# Patient Record
Sex: Female | Born: 1995 | Hispanic: No | Marital: Single | Smoking: Never smoker
Health system: Southern US, Community
[De-identification: ages and names within clinical notes are randomized; demographics above are authoritative.]

## PROBLEM LIST (undated history)

## (undated) DIAGNOSIS — F32A Depression, unspecified: Secondary | ICD-10-CM

## (undated) DIAGNOSIS — F419 Anxiety disorder, unspecified: Secondary | ICD-10-CM

## (undated) DIAGNOSIS — F329 Major depressive disorder, single episode, unspecified: Secondary | ICD-10-CM

---

## 2017-07-31 ENCOUNTER — Encounter (HOSPITAL_COMMUNITY): Payer: Self-pay | Admitting: *Deleted

## 2017-07-31 ENCOUNTER — Encounter (HOSPITAL_COMMUNITY): Payer: Self-pay | Admitting: Emergency Medicine

## 2017-07-31 ENCOUNTER — Emergency Department (HOSPITAL_COMMUNITY)
Admission: EM | Admit: 2017-07-31 | Discharge: 2017-07-31 | Disposition: A | Discharge status: Other Acute Inpatient Hospital | Payer: BLUE CROSS/BLUE SHIELD | Attending: Emergency Medicine | Admitting: Emergency Medicine

## 2017-07-31 ENCOUNTER — Inpatient Hospital Stay (HOSPITAL_COMMUNITY)
Admission: AD | Admit: 2017-07-31 | Discharge: 2017-08-02 | DRG: 885 | Disposition: A | Discharge status: Home / Self Care | Payer: BLUE CROSS/BLUE SHIELD | Source: Intra-hospital | Attending: Psychiatry | Admitting: Psychiatry

## 2017-07-31 ENCOUNTER — Other Ambulatory Visit: Payer: Self-pay

## 2017-07-31 DIAGNOSIS — R45851 Suicidal ideations: Secondary | ICD-10-CM | POA: Diagnosis present

## 2017-07-31 DIAGNOSIS — N760 Acute vaginitis: Secondary | ICD-10-CM | POA: Diagnosis present

## 2017-07-31 DIAGNOSIS — F341 Dysthymic disorder: Secondary | ICD-10-CM

## 2017-07-31 DIAGNOSIS — G4700 Insomnia, unspecified: Secondary | ICD-10-CM | POA: Diagnosis present

## 2017-07-31 DIAGNOSIS — Z818 Family history of other mental and behavioral disorders: Secondary | ICD-10-CM | POA: Diagnosis not present

## 2017-07-31 DIAGNOSIS — F332 Major depressive disorder, recurrent severe without psychotic features: Secondary | ICD-10-CM | POA: Diagnosis present

## 2017-07-31 DIAGNOSIS — F1290 Cannabis use, unspecified, uncomplicated: Secondary | ICD-10-CM

## 2017-07-31 DIAGNOSIS — F410 Panic disorder [episodic paroxysmal anxiety] without agoraphobia: Secondary | ICD-10-CM | POA: Diagnosis present

## 2017-07-31 DIAGNOSIS — Z79899 Other long term (current) drug therapy: Secondary | ICD-10-CM

## 2017-07-31 DIAGNOSIS — F4010 Social phobia, unspecified: Secondary | ICD-10-CM | POA: Diagnosis present

## 2017-07-31 HISTORY — DX: Depression, unspecified: F32.A

## 2017-07-31 HISTORY — DX: Anxiety disorder, unspecified: F41.9

## 2017-07-31 HISTORY — DX: Major depressive disorder, single episode, unspecified: F32.9

## 2017-07-31 LAB — ACETAMINOPHEN LEVEL: Acetaminophen (Tylenol), Serum: <10 ug/mL — ABNORMAL LOW (ref 10–30)

## 2017-07-31 LAB — URINALYSIS, ROUTINE W REFLEX MICROSCOPIC
Bilirubin Urine: NEGATIVE
Glucose, UA: NEGATIVE mg/dL
HGB URINE DIPSTICK: NEGATIVE
Ketones, ur: NEGATIVE mg/dL
NITRITE: NEGATIVE
PH: 5 (ref 5.0–8.0)
Protein, ur: NEGATIVE mg/dL
SPECIFIC GRAVITY, URINE: 1.031 — AB (ref 1.005–1.030)

## 2017-07-31 LAB — I-STAT CHEM 8, ED
BUN: 14 mg/dL (ref 6–20)
CHLORIDE: 103 mmol/L (ref 98–111)
Calcium, Ion: 1.16 mmol/L (ref 1.15–1.40)
Creatinine, Ser: 0.5 mg/dL (ref 0.44–1.00)
GLUCOSE: 107 mg/dL — AB (ref 70–99)
HCT: 41 % (ref 36.0–46.0)
HEMOGLOBIN: 13.9 g/dL (ref 12.0–15.0)
Potassium: 3.9 mmol/L (ref 3.5–5.1)
SODIUM: 137 mmol/L (ref 135–145)
TCO2: 21 mmol/L — AB (ref 22–32)

## 2017-07-31 LAB — CBC WITH DIFFERENTIAL/PLATELET
BASOS PCT: 0 %
Basophils Absolute: 0 10*3/uL (ref 0.0–0.1)
Eosinophils Absolute: 0.1 10*3/uL (ref 0.0–0.7)
Eosinophils Relative: 2 %
HEMATOCRIT: 39.3 % (ref 36.0–46.0)
HEMOGLOBIN: 13.8 g/dL (ref 12.0–15.0)
LYMPHS ABS: 2.4 10*3/uL (ref 0.7–4.0)
LYMPHS PCT: 51 %
MCH: 32.2 pg (ref 26.0–34.0)
MCHC: 35.1 g/dL (ref 30.0–36.0)
MCV: 91.6 fL (ref 78.0–100.0)
MONO ABS: 0.4 10*3/uL (ref 0.1–1.0)
Monocytes Relative: 9 %
NEUTROS ABS: 1.7 10*3/uL (ref 1.7–7.7)
NEUTROS PCT: 38 %
Platelets: 339 10*3/uL (ref 150–400)
RBC: 4.29 MIL/uL (ref 3.87–5.11)
RDW: 12.8 % (ref 11.5–15.5)
WBC: 4.6 10*3/uL (ref 4.0–10.5)

## 2017-07-31 LAB — ETHANOL: Alcohol, Ethyl (B): <10 mg/dL (ref <10)

## 2017-07-31 LAB — HEPATIC FUNCTION PANEL
ALT: 37 U/L (ref 0–44)
AST: 28 U/L (ref 15–41)
Albumin: 4.1 g/dL (ref 3.5–5.0)
Alkaline Phosphatase: 52 U/L (ref 38–126)
BILIRUBIN DIRECT: 0.1 mg/dL (ref 0.0–0.2)
BILIRUBIN INDIRECT: 1.1 mg/dL — AB (ref 0.3–0.9)
BILIRUBIN TOTAL: 1.2 mg/dL (ref 0.3–1.2)
Total Protein: 7.7 g/dL (ref 6.5–8.1)

## 2017-07-31 LAB — RAPID URINE DRUG SCREEN, HOSP PERFORMED
AMPHETAMINES: NOT DETECTED
Benzodiazepines: NOT DETECTED
COCAINE: NOT DETECTED
Opiates: NOT DETECTED
TETRAHYDROCANNABINOL: NOT DETECTED

## 2017-07-31 LAB — I-STAT BETA HCG BLOOD, ED (MC, WL, AP ONLY)

## 2017-07-31 LAB — SALICYLATE LEVEL

## 2017-07-31 MED ORDER — FLUOXETINE HCL 20 MG PO CAPS
20.0000 mg | ORAL_CAPSULE | Freq: Every day | ORAL | Status: DC
  Administered 2017-07-31 – 2017-08-02 (×3): 20 mg via ORAL
  Filled 2017-07-31 (×6): qty 1

## 2017-07-31 MED ORDER — PROPRANOLOL HCL 20 MG PO TABS
20.0000 mg | ORAL_TABLET | Freq: Every day | ORAL | Status: DC
  Administered 2017-07-31: 20 mg via ORAL
  Filled 2017-07-31 (×3): qty 1
  Filled 2017-07-31: qty 2

## 2017-07-31 MED ORDER — HYDROXYZINE HCL 25 MG PO TABS
25.0000 mg | ORAL_TABLET | Freq: Four times a day (QID) | ORAL | Status: DC | PRN
  Administered 2017-07-31 – 2017-08-01 (×2): 25 mg via ORAL
  Filled 2017-07-31 (×2): qty 1

## 2017-07-31 MED ORDER — ACETAMINOPHEN 325 MG PO TABS: 650.0000 mg | ORAL_TABLET | ORAL | Status: DC | PRN

## 2017-07-31 MED ORDER — MAGNESIUM HYDROXIDE 400 MG/5ML PO SUSP: 30.0000 mL | Freq: Every day | ORAL | Status: DC | PRN

## 2017-07-31 MED ORDER — PROPRANOLOL HCL 10 MG PO TABS
10.0000 mg | ORAL_TABLET | Freq: Two times a day (BID) | ORAL | Status: DC
  Administered 2017-08-01 – 2017-08-02 (×3): 10 mg via ORAL
  Filled 2017-07-31 (×7): qty 1

## 2017-07-31 MED ORDER — TRAZODONE HCL 50 MG PO TABS
50.0000 mg | ORAL_TABLET | Freq: Every evening | ORAL | Status: DC | PRN
  Administered 2017-07-31 – 2017-08-01 (×2): 50 mg via ORAL
  Filled 2017-07-31 (×2): qty 1

## 2017-07-31 MED ORDER — ONDANSETRON HCL 4 MG PO TABS: 4.0000 mg | ORAL_TABLET | Freq: Three times a day (TID) | ORAL | Status: DC | PRN

## 2017-07-31 MED ORDER — ALUM & MAG HYDROXIDE-SIMETH 200-200-20 MG/5ML PO SUSP: 30.0000 mL | ORAL | Status: DC | PRN

## 2017-07-31 MED ORDER — ALUM & MAG HYDROXIDE-SIMETH 200-200-20 MG/5ML PO SUSP: 30.0000 mL | Freq: Four times a day (QID) | ORAL | Status: DC | PRN

## 2017-07-31 MED ORDER — ACETAMINOPHEN 325 MG PO TABS: 650.0000 mg | ORAL_TABLET | Freq: Four times a day (QID) | ORAL | Status: DC | PRN
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
## 2017-07-31 NOTE — BH Assessment (Signed)
[facility]  Per Juanetta BeetsJacqueline Norman, DO, this pt requires psychiatric hospitalization at this time.  Malva LimesLinsey Strader, RN, [place] [facility]; [facility] will be ready to receive pt at 11:30.  Pt has signed Voluntary Admission and Consent for Treatment, as well as Consent to Release Information to the [facility] [employer] [facility], and a notification call has been placed.  Signed [facility].  Pt's nurse, Kendal Hymendie, has been notified, and agrees to send original paperwork along with pt via [facility], and to call report to [phone].  Doylene Canninghomas Jahaan Vanwagner, [place] Behavioral Health Coordinator [phone]

## 2017-07-31 NOTE — Progress Notes (Signed)
The following items were placed in the patient's locker: toothbrush, [employer] gift card, [employer] gift card, and an insurance card (V.A.).

## 2017-07-31 NOTE — ED Notes (Signed)
Pt discharged safely with [place] driver.  Pt was in no distress.  All belongings sent with patient.
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
## 2017-07-31 NOTE — Therapy (Signed)
Occupational Therapy Group Treatment Note  Date:  07/31/2017 Time:  3:07 PM  Group Topic/Focus:  Stress Management  Participation Level:  Active  Participation Quality:  Appropriate  Affect:  Flat  Cognitive:  Appropriate  Insight: Improving  Engagement in Group:  Engaged  Modes of Intervention:  Activity, Discussion, Education and Socialization  Additional Comments:    S: "I just cannot control my feelings"  O: Stress management group completed to use as productive coping strategy, to help mitigate maladaptive coping to integrate in functional BADL/IADL. Education given on the definition of stress and its cognitive, behavioral, emotional, and physical effects on the body. Stress symptom checklist completed. Stress management tool worksheet discussed to educate on unhealthy vs healthy coping skills to manage stress to improve community integration. Self control circle activity completed to identify areas of control and areas not within personal control. Education given on use of progressive muscle relaxation. PMR script delivered with relaxing music to facilitate relaxation response to help increase ability to engage in BADL. Coloring and relaxation guide handouts given at the end of the session.   A: Pt presents as good group member, flat affect initially, but improving with smiles and laughter and jokes by the end of the group. Pt initiating conversation, offering examples, and relating to other group members. Pt completed stress symptom checklist, showing very high signs of stress. Pt engaged in stress management tools worksheet, offering examples and how to improve current practices. Self control circle activity completed, pt recognizing she cannot control how others speak to her- but she can control her reactions. She applied this to many different relationships. Pt engaged in PMR script, with much enjoyment and significant relaxation response. Pt asked for tips on emotional  regulation, mentioned pt try journaling and mood tracking.  P: Pt provided with education on stress management activities to implement into daily routine. Handouts given to facilitate carryover when reintegrating into community    [facility] Yonas Pugh, [place]OT, OTR/L  AvnetKaylee Damarien Pugh 07/31/2017, 3:07 PM

## 2017-07-31 NOTE — ED Notes (Signed)
Pt dressed out in scrubs, put purse with phone in belongings bag in cabinet.

## 2017-07-31 NOTE — ED Triage Notes (Signed)
Pt arriving voluntarily with [employer] for suicidal ideation. Pt reports having multiple mental breakdowns recently. When asked what triggers these thoughts pt replies "everything". Pt reports having no support system at home. Pt states her plan for suicide would be to take sleeping pills. Pt reports she is on medication for anxiety/depression but has not been taking them. Pt states " I feel like my emotions are just out of control."

## 2017-07-31 NOTE — Progress Notes (Signed)
Pt observed in the dayroom, attending wrap-up group. Pt appears anxious/depressed in affect and mood. Pt denies SI/HI/AVH/Pain at this time. Pt states this is her first in-patient admit. Pt was guarded and minimal with interaction; superficial in content. PRN vistaril and trazodone requested and given. Will continue with POC.

## 2017-07-31 NOTE — ED Provider Notes (Signed)
[facility]   CSN: [id number] Arrival date & time: 07/31/17  0505     History   Chief Complaint Chief Complaint  Patient presents with  . Suicidal    HPI Andrea Pugh is a 22 y.o. female.  The history is provided by the patient.  Depression  This is a chronic problem. The current episode started more than 1 week ago. The problem occurs constantly. The problem has been gradually worsening (it waxes and wanes whenever she sees happy people etc.  ). Pertinent negatives include no chest pain, no abdominal pain, no headaches and no shortness of breath. Nothing aggravates the symptoms. Nothing relieves the symptoms. She has tried nothing for the symptoms. The treatment provided no relief.    Past Medical History:  Diagnosis Date  . Anxiety   . Depression     There are no active problems to display for this patient.   History reviewed. No pertinent surgical history.   OB History   None      Home Medications    Prior to Admission medications   Medication Sig Start Date End Date Taking? Authorizing Provider  FLUoxetine (PROZAC) 20 MG capsule Take 20 mg by mouth daily. 06/05/17  Yes [provider]  propranolol (INDERAL) 20 MG tablet Take 20 mg by mouth daily.  06/05/17  Yes [provider]    Family History No family history on file.  Social History Social History   Tobacco Use  . Smoking status: Never Smoker  . Smokeless tobacco: Never Used  Substance Use Topics  . Alcohol use: Yes    Comment: occasional  . Drug use: Yes    Types: Marijuana     Allergies   Patient has no known allergies.   Review of Systems Review of Systems  Respiratory: Negative for shortness of breath.   Cardiovascular: Negative for chest pain.  Gastrointestinal: Negative for abdominal pain and vomiting.  Genitourinary: Negative for flank pain and vaginal discharge.  Neurological: Negative for headaches.    Psychiatric/Behavioral: Positive for depression, dysphoric mood and suicidal ideas. Negative for hallucinations and self-injury.  All other systems reviewed and are negative.    Physical Exam Updated Vital Signs BP 121/71 (BP Location: Left Arm)   Pulse 83   Temp 98.9 F (37.2 C) (Oral)   Resp 14   Ht 5\' 5"  (1.651 m)   Wt 81.6 kg (180 lb)   LMP 06/19/2017 (Approximate)   SpO2 97%   BMI 29.95 kg/m   Physical Exam  Constitutional: She is oriented to person, place, and time. She appears well-developed and well-nourished. No distress.  HENT:  Head: Normocephalic and atraumatic.  Mouth/Throat: No oropharyngeal exudate.  Eyes: Pupils are equal, round, and reactive to light. Conjunctivae are normal.  Neck: Normal range of motion. Neck supple.  Cardiovascular: Normal rate, regular rhythm, normal heart sounds and intact distal pulses.  Pulmonary/Chest: Effort normal and breath sounds normal. No stridor. She has no wheezes. She has no rales.  Abdominal: Soft. Bowel sounds are normal. She exhibits no mass. There is no tenderness. There is no rebound and no guarding.  Musculoskeletal: Normal range of motion.  Neurological: She is alert and oriented to person, place, and time.  Skin: Skin is warm and dry. Capillary refill takes less than 2 seconds.  Psychiatric: Her speech is not rapid and/or pressured and not delayed. She is not agitated. She exhibits a depressed mood.     ED Treatments / Results  Labs (all labs ordered are listed, but only abnormal results are displayed) Results for orders placed or performed during the hospital encounter of 07/31/17  CBC with Differential  Result Value Ref Range   WBC 4.6 4.0 - 10.5 K/uL   RBC 4.29 3.87 - 5.11 MIL/uL   Hemoglobin 13.8 12.0 - 15.0 g/dL   HCT 16.1 09.6 - 04.5 %   MCV 91.6 78.0 - 100.0 fL   MCH 32.2 26.0 - 34.0 pg   MCHC 35.1 30.0 - 36.0 g/dL   RDW 40.9 81.1 - 91.4 %   Platelets 339 150 - 400 K/uL   Neutrophils Relative % 38 %    Neutro Abs 1.7 1.7 - 7.7 K/uL   Lymphocytes Relative 51 %   Lymphs Abs 2.4 0.7 - 4.0 K/uL   Monocytes Relative 9 %   Monocytes Absolute 0.4 0.1 - 1.0 K/uL   Eosinophils Relative 2 %   Eosinophils Absolute 0.1 0.0 - 0.7 K/uL   Basophils Relative 0 %   Basophils Absolute 0.0 0.0 - 0.1 K/uL  Hepatic function panel  Result Value Ref Range   Total Protein 7.7 6.5 - 8.1 g/dL   Albumin 4.1 3.5 - 5.0 g/dL   AST 28 15 - 41 U/L   ALT 37 0 - 44 U/L   Alkaline Phosphatase 52 38 - 126 U/L   Total Bilirubin 1.2 0.3 - 1.2 mg/dL   Bilirubin, Direct 0.1 0.0 - 0.2 mg/dL   Indirect Bilirubin 1.1 (H) 0.3 - 0.9 mg/dL  Urinalysis, Routine w reflex microscopic  Result Value Ref Range   Color, Urine YELLOW YELLOW   APPearance CLOUDY (A) CLEAR   Specific Gravity, Urine 1.031 (H) 1.005 - 1.030   pH 5.0 5.0 - 8.0   Glucose, UA NEGATIVE NEGATIVE mg/dL   Hgb urine dipstick NEGATIVE NEGATIVE   Bilirubin Urine NEGATIVE NEGATIVE   Ketones, ur NEGATIVE NEGATIVE mg/dL   Protein, ur NEGATIVE NEGATIVE mg/dL   Nitrite NEGATIVE NEGATIVE   Leukocytes, UA TRACE (A) NEGATIVE   RBC / HPF 0-5 0 - 5 RBC/hpf   WBC, UA 0-5 0 - 5 WBC/hpf   Bacteria, UA RARE (A) NONE SEEN   Squamous Epithelial / LPF 21-50 0 - 5   Mucus PRESENT   I-Stat Chem 8, ED  Result Value Ref Range   Sodium 137 135 - 145 mmol/L   Potassium 3.9 3.5 - 5.1 mmol/L   Chloride 103 98 - 111 mmol/L   BUN 14 6 - 20 mg/dL   Creatinine, Ser 7.82 0.44 - 1.00 mg/dL   Glucose, Bld 956 (H) 70 - 99 mg/dL   Calcium, Ion 2.13 0.86 - 1.40 mmol/L   TCO2 21 (L) 22 - 32 mmol/L   Hemoglobin 13.9 12.0 - 15.0 g/dL   HCT 57.8 46.9 - 62.9 %  I-Stat Beta hCG blood, ED ([facility], [facility], [facility] only)  Result Value Ref Range   I-stat hCG, quantitative <5.0 <5 mIU/mL   Comment 3           No results found.   Procedures Procedures (including critical care time)    Final Clinical Impressions(s) / ED Diagnoses   Patient is here voluntarily and agrees for admission.   Medically cleared by me for psychiatry.  Disposition per that service.  Holding orders placed.     Raenell Mensing, MD 07/31/17 [phone]

## 2017-07-31 NOTE — Tx Team (Signed)
Initial Treatment Plan 07/31/2017 12:50 PM Andrea Pugh    PATIENT STRESSORS: Financial difficulties Medication change or noncompliance   PATIENT STRENGTHS: Average or above average intelligence Capable of independent living Physical Health Work skills   PATIENT IDENTIFIED PROBLEMS: "I need to start back on my medication so I don't have these suicidal thoughts."  "I feel like my emotions are out of control."  Suicidal Ideation  Depression  Anxiety             DISCHARGE CRITERIA:  Improved stabilization in mood, thinking, and/or behavior Motivation to continue treatment in a less acute level of care Need for constant or close observation no longer present  PRELIMINARY DISCHARGE PLAN: Outpatient therapy Return to previous living arrangement Return to previous work or school arrangements  PATIENT/FAMILY INVOLVEMENT: This treatment plan has been presented to and reviewed with the patient, Andrea Pugh.  The patient and family have been given the opportunity to ask questions and make suggestions.  Cranford MonBeaudry, Manuel Dall Evans, RN 07/31/2017, 12:50 PM

## 2017-07-31 NOTE — H&P (Addendum)
Psychiatric Admission Assessment Adult  Patient Identification: Andrea Pugh MRN:  [id number] Date of Evaluation:  07/31/2017 Chief Complaint: worsening depression and anxiety Principal Diagnosis:  MDD, no Psychotic Features  Diagnosis:   Patient Active Problem List   Diagnosis Date Noted  . Major depressive disorder, recurrent severe without psychotic features (HCC) [F33.2] 07/31/2017   History of Present Illness:  22 year old single female, college student, employed, presented to ED via [facility], due to worsening depression and anxiety. States she had texted a friend about how depressed she was,and that friend became concerned and called 911. States she has had several episodes of  panic attacks recently , and feels her depression has also been worsening . She endorses neuro-vegetative symptoms as below, and also describes passive (but not active) thoughts of death, dying . Endorses neuro-vegetative symptoms as below. Reports that there have been no clear triggers for her panic attacks, but that seeing people " enjoying themselves, laughing" makes her feel upset and anxious because " why can't I feel that way". In addition to depression describes significant anxiety and describes symptoms of social anxiety. Associated Signs/Symptoms: Depression Symptoms:  depressed mood, anhedonia, insomnia, suicidal thoughts without plan, loss of energy/fatigue, (Hypo) Manic Symptoms: denies  Anxiety Symptoms:  Reports long history of social anxiety , and feels very anxious when having to interact or speak in front of others. Also describes panic attacks which she feels have become more frequent over recent weeks to months. Psychotic Symptoms: denies  PTSD Symptoms: Does not endorse  Total Time spent with patient: 45 minutes  Past Psychiatric History: no prior psychiatric admissions, no history of suicide attempts, history of an isolated episode of self cutting about a year ago, denies history of  psychosis.  Is the patient at risk to self? Yes.    Has the patient been a risk to self in the past 6 months? Yes.    Has the patient been a risk to self within the distant past? Yes.    Is the patient a risk to others? No.  Has the patient been a risk to others in the past 6 months? No.  Has the patient been a risk to others within the distant past? No.   Prior Inpatient Therapy:  denies  Prior Outpatient Therapy:  medications are prescribed by Dr. Jannifer Franklin at [employer] [facility]   Alcohol Screening: 1. How often do you have a drink containing alcohol?: Monthly or less 2. How many drinks containing alcohol do you have on a typical day when you are drinking?: 1 or 2 3. How often do you have six or more drinks on one occasion?: Never AUDIT-C Score: 1 9. Have you or someone else been injured as a result of your drinking?: No 10. Has a relative or friend or a doctor or another health worker been concerned about your drinking or suggested you cut down?: No Alcohol Use Disorder Identification Test Final Score (AUDIT): 1 Intervention/Follow-up: AUDIT Score <7 follow-up not indicated Substance Abuse History in the last 12 months:  Denies alcohol or drug abuse  Consequences of Substance Abuse: Denies  Previous Psychotropic Medications:  Prozac / Propranolol, prescribed earlier this year. States she had not been taking regularly . States she had been on another antidepressant in the past but does not remember name. Psychological Evaluations:  No  Past Medical History: denies, NKDA Past Medical History:  Diagnosis Date  . Anxiety   . Depression    History reviewed. No pertinent surgical history. Family  History: father passed away 2011 from complications of DM, mother is alive, but is medically ill with Parkinson's and is in a NH. She has two sisters, two brothers. Family Psychiatric  History: reports younger sister has history of depression, no suicides in family, no history of substance abuse   Tobacco Screening: Does not smoke or use tobacco products  Social History: 22  Year old single female, no children, lives off campus with roommates, Consulting civil engineer at [employer]), employed . Social History   Substance and Sexual Activity  Alcohol Use Yes   Comment: occasional     Social History   Substance and Sexual Activity  Drug Use Yes  . Types: Marijuana    Additional Social History:  Allergies:  No Known Allergies Lab Results:  Results for orders placed or performed during the hospital encounter of 07/31/17 (from the past 48 hour(s))  Rapid urine drug screen (hospital performed)     Status: Abnormal   Collection Time: 07/31/17  5:53 AM  Result Value Ref Range   Opiates NONE DETECTED NONE DETECTED   Cocaine NONE DETECTED NONE DETECTED   Benzodiazepines NONE DETECTED NONE DETECTED   Amphetamines NONE DETECTED NONE DETECTED   Tetrahydrocannabinol NONE DETECTED NONE DETECTED   Barbiturates (A) NONE DETECTED    Result not available. Reagent lot number recalled by manufacturer.    Comment: Performed at [facility], [address], [place], [place] [address]  Urinalysis, Routine w reflex microscopic     Status: Abnormal   Collection Time: 07/31/17  5:53 AM  Result Value Ref Range   Color, Urine YELLOW YELLOW   APPearance CLOUDY (A) CLEAR   Specific Gravity, Urine 1.031 (H) 1.005 - 1.030   pH 5.0 5.0 - 8.0   Glucose, UA NEGATIVE NEGATIVE mg/dL   Hgb urine dipstick NEGATIVE NEGATIVE   Bilirubin Urine NEGATIVE NEGATIVE   Ketones, ur NEGATIVE NEGATIVE mg/dL   Protein, ur NEGATIVE NEGATIVE mg/dL   Nitrite NEGATIVE NEGATIVE   Leukocytes, UA TRACE (A) NEGATIVE   RBC / HPF 0-5 0 - 5 RBC/hpf   WBC, UA 0-5 0 - 5 WBC/hpf   Bacteria, UA RARE (A) NONE SEEN   Squamous Epithelial / LPF 21-50 0 - 5   Mucus PRESENT     Comment: Performed at [facility], [address], [place], [place] [address]  CBC with Differential     Status: None   Collection  Time: 07/31/17  5:54 AM  Result Value Ref Range   WBC 4.6 4.0 - 10.5 K/uL   RBC 4.29 3.87 - 5.11 MIL/uL   Hemoglobin 13.8 12.0 - 15.0 g/dL   HCT 09.8 11.9 - 14.7 %   MCV 91.6 78.0 - 100.0 fL   MCH 32.2 26.0 - 34.0 pg   MCHC 35.1 30.0 - 36.0 g/dL   RDW 82.9 56.2 - 13.0 %   Platelets 339 150 - 400 K/uL   Neutrophils Relative % 38 %   Neutro Abs 1.7 1.7 - 7.7 K/uL   Lymphocytes Relative 51 %   Lymphs Abs 2.4 0.7 - 4.0 K/uL   Monocytes Relative 9 %   Monocytes Absolute 0.4 0.1 - 1.0 K/uL   Eosinophils Relative 2 %   Eosinophils Absolute 0.1 0.0 - 0.7 K/uL   Basophils Relative 0 %   Basophils Absolute 0.0 0.0 - 0.1 K/uL    Comment: Performed at [facility], [address] [place], [place] [address]  Ethanol  Status: None   Collection Time: 07/31/17  5:54 AM  Result Value Ref Range   Alcohol, Ethyl (B) <10 <10 mg/dL    Comment: (NOTE) Lowest detectable limit for serum alcohol is 10 mg/dL. For medical purposes only. Performed at [facility], [address] [place], [place] [address]   Salicylate level     Status: None   Collection Time: 07/31/17  5:54 AM  Result Value Ref Range   Salicylate Lvl <7.0 2.8 - 30.0 mg/dL    Comment: Performed at [facility], [address] [place], [place] [address]  Acetaminophen level     Status: Abnormal   Collection Time: 07/31/17  5:54 AM  Result Value Ref Range   Acetaminophen (Tylenol), Serum <10 (L) 10 - 30 ug/mL    Comment: (NOTE) Therapeutic concentrations vary significantly. A range of 10-30 ug/mL  may be an effective concentration for many patients. However, some  are best treated at concentrations outside of this range. Acetaminophen concentrations >150 ug/mL at 4 hours after ingestion  and >50 ug/mL at 12 hours after ingestion are often associated with  toxic reactions. Performed at [facility], [address] [place], [place] [address]    Hepatic function panel     Status: Abnormal   Collection Time: 07/31/17  5:54 AM  Result Value Ref Range   Total Protein 7.7 6.5 - 8.1 g/dL   Albumin 4.1 3.5 - 5.0 g/dL   AST 28 15 - 41 U/L   ALT 37 0 - 44 U/L    Comment: Please note change in reference range.   Alkaline Phosphatase 52 38 - 126 U/L   Total Bilirubin 1.2 0.3 - 1.2 mg/dL   Bilirubin, Direct 0.1 0.0 - 0.2 mg/dL    Comment: Please note change in reference range.   Indirect Bilirubin 1.1 (H) 0.3 - 0.9 mg/dL    Comment: Performed at [facility], [address] [place], [place] [address]  I-Stat Chem 8, ED     Status: Abnormal   Collection Time: 07/31/17  6:09 AM  Result Value Ref Range   Sodium 137 135 - 145 mmol/L   Potassium 3.9 3.5 - 5.1 mmol/L   Chloride 103 98 - 111 mmol/L   BUN 14 6 - 20 mg/dL   Creatinine, Ser 2.95 0.44 - 1.00 mg/dL   Glucose, Bld 621 (H) 70 - 99 mg/dL   Calcium, Ion 3.08 6.57 - 1.40 mmol/L   TCO2 21 (L) 22 - 32 mmol/L   Hemoglobin 13.9 12.0 - 15.0 g/dL   HCT 84.6 96.2 - 95.2 %  I-Stat Beta hCG blood, ED ([facility], [facility], [facility] only)     Status: None   Collection Time: 07/31/17  6:16 AM  Result Value Ref Range   I-stat hCG, quantitative <5.0 <5 mIU/mL   Comment 3            Comment:   GEST. AGE      CONC.  (mIU/mL)   <=1 WEEK        5 - 50     2 WEEKS       50 - 500     3 WEEKS       100 - 10,000     4 WEEKS     1,000 - 30,000        FEMALE AND NON-PREGNANT FEMALE:     LESS THAN 5 mIU/mL     Blood Alcohol level:  Lab  Results  Component Value Date   ETH <10 07/31/2017    Metabolic Disorder Labs:  No results found for: HGBA1C, MPG No results found for: PROLACTIN No results found for: CHOL, TRIG, HDL, CHOLHDL, VLDL, LDLCALC  Current Medications: Current Facility-Administered Medications  Medication Dose Route Frequency Provider Last Rate Last Dose  . acetaminophen (TYLENOL) tablet 650 mg  650 mg Oral Q6H PRN Charm Rings, NP      . alum & mag hydroxide-simeth  (MAALOX/MYLANTA) 200-200-20 MG/5ML suspension 30 mL  30 mL Oral Q4H PRN Charm Rings, NP      . FLUoxetine (PROZAC) capsule 20 mg  20 mg Oral Daily Charm Rings, NP   20 mg at 07/31/17 1356  . magnesium hydroxide (MILK OF MAGNESIA) suspension 30 mL  30 mL Oral Daily PRN Charm Rings, NP      . propranolol (INDERAL) tablet 20 mg  20 mg Oral Daily Charm Rings, NP   20 mg at 07/31/17 1356   PTA Medications: Medications Prior to Admission  Medication Sig Dispense Refill Last Dose  . FLUoxetine (PROZAC) 20 MG capsule Take 20 mg by mouth daily.  1 07/30/2017 at Unknown time  . propranolol (INDERAL) 20 MG tablet Take 20 mg by mouth daily.   1 07/30/2017 at 1100    Musculoskeletal: Strength & Muscle Tone: within normal limits Gait & Station: normal Patient leans: N/A  Psychiatric Specialty Exam: Physical Exam  Review of Systems  Constitutional: Negative.   HENT: Negative.   Eyes: Negative.   Respiratory: Negative.   Cardiovascular: Negative.   Gastrointestinal: Negative.   Genitourinary:       Reports vague dysuria/discomfort  Musculoskeletal: Negative.   Skin: Negative.   Neurological: Positive for headaches. Negative for seizures.  Endo/Heme/Allergies: Negative.   Psychiatric/Behavioral: Positive for depression and suicidal ideas. The patient is nervous/anxious.     Blood pressure 117/75, pulse 98, temperature 99 F (37.2 C), temperature source Oral, resp. rate 18, height 5\' 5"  (1.651 m), weight 81.6 kg (180 lb), SpO2 97 %.Body mass index is 29.95 kg/m.  General Appearance: Fairly Groomed  Eye Contact:  Good  Speech:  Normal Rate  Volume:  Normal  Mood:  Anxious and Depressed  Affect:  constricted,vaguely anxious but improves as session progresses   Thought Process:  Linear and Descriptions of Associations: Intact  Orientation:  Full (Time, Place, and Person)  Thought Content:  no hallucinations, no delusions , not internally preoccupied   Suicidal Thoughts:  No  denies current suicidal or self injurious ideations, denies homicidal or violent ideations, contracts for safety on unit  Homicidal Thoughts:  No  Memory:  recent and remote grossly intact   Judgement:  Fair  Insight:  Fair  Psychomotor Activity:  Normal  Concentration:  Concentration: Good and Attention Span: Good  Recall:  Good  Fund of Knowledge:  Good  Language:  Good  Akathisia:  Negative  Handed:  Right  AIMS (if indicated):     Assets:  Communication Skills Desire for Improvement Resilience  ADL's:  Intact  Cognition:  WNL  Sleep:       Treatment Plan Summary: Daily contact with patient to assess and evaluate symptoms and progress in treatment, Medication management, Plan inpatient treatment' and medications as below  Observation Level/Precautions:  15 minute checks  Laboratory:  as needed- TSH, recheck UA and UCulture  Psychotherapy:  Milieu, group therapy   Medications:  Patient reports she had been feeling better when she took  Prozac/Inderal consistently and did not have any significant side effects. Prefers to continue these medications rather than start new trial. Inderal 10 mgrs BID Prozac 20 mgrs QDAY   Consultations:  As needed    Discharge Concerns:  -  Estimated LOS: 4 days   Other:     Physician Treatment Plan for Primary Diagnosis:  MDD, no psychotic features Long Term Goal(s): Improvement in symptoms so as ready for discharge  Short Term Goals: Ability to identify changes in lifestyle to reduce recurrence of condition will improve and Ability to maintain clinical measurements within normal limits will improve  Physician Treatment Plan for Secondary Diagnosis: Anxiety- Social Phobia, consider Panic Disorder  Long Term Goal(s): Improvement in symptoms so as ready for discharge  Short Term Goals: Ability to identify changes in lifestyle to reduce recurrence of condition will improve and Ability to maintain clinical measurements within normal limits will  improve  I certify that inpatient services furnished can reasonably be expected to improve the patient's condition.    Craige CottaFernando A Cobos, MD 6/26/20194:24 PM

## 2017-07-31 NOTE — BHH Counselor (Signed)
Disposition:   Per Dr. Sharma CovertNorman and Nanine MeansJamison Lord, DNP, patient meets criteria for inpatient treatment. Behavioral Health Coordinator will seek inpatient placement.

## 2017-07-31 NOTE — ED Notes (Signed)
Pt oriented to room and unit.  Pt is calm and cooperative.  Pt contracts for safety.  15 minute checks and video monitoring in place. 

## 2017-08-01 LAB — URINALYSIS, COMPLETE (UACMP) WITH MICROSCOPIC
BACTERIA UA: NONE SEEN
BILIRUBIN URINE: NEGATIVE
GLUCOSE, UA: NEGATIVE mg/dL
HGB URINE DIPSTICK: NEGATIVE
Ketones, ur: NEGATIVE mg/dL
LEUKOCYTES UA: NEGATIVE
NITRITE: NEGATIVE
PROTEIN: NEGATIVE mg/dL
Specific Gravity, Urine: 1.029 (ref 1.005–1.030)
pH: 5 (ref 5.0–8.0)

## 2017-08-01 LAB — TSH: TSH: 2.034 u[IU]/mL (ref 0.350–4.500)

## 2017-08-01 MED ORDER — METRONIDAZOLE 500 MG PO TABS
500.0000 mg | ORAL_TABLET | Freq: Two times a day (BID) | ORAL | Status: DC
Start: 2017-08-01 — End: 2017-08-02
  Administered 2017-08-01 – 2017-08-02 (×2): 500 mg via ORAL
  Filled 2017-08-01 (×2): qty 1
  Filled 2017-08-01: qty 2
  Filled 2017-08-01 (×2): qty 1
  Filled 2017-08-01: qty 2
  Filled 2017-08-01 (×2): qty 1

## 2017-08-01 NOTE — BHH Group Notes (Signed)
Adult Psychoeducational Group Note  Date:  08/01/2017 Time:  2:53 PM  Group Topic/Focus:  Goals Group:   The focus of this group is to help patients establish daily goals to achieve during treatment and discuss how the patient can incorporate goal setting into their daily lives to aide in recovery.  Participation Level:  Active  Participation Quality:  Appropriate  Affect:  Appropriate  Cognitive:  Alert  Insight: Good  Engagement in Group:  Engaged  Modes of Intervention:  Discussion  Additional Comments:  Patient goal today is to discuss going home with the doctor and how to complete responsibilities as well as work. Patient participated and engaged in group discussion on crisis management in the adult unit workbook. Patient was able to identify feelings leading up to a crisis situation.   Andrea LaymanBreannah R Davian Pugh 08/01/2017, 2:53 PM

## 2017-08-01 NOTE — Progress Notes (Addendum)
[facility] MD Progress Note  08/01/2017 3:23 PM Andrea Pugh  MRN:  [id number] Subjective: patient reports she is feeling partially better, but states she has been feeling vaguely irritable and " cranky", which she attributes to not sleeping well last night. States it was hard for her to relax enough to sleep due to being in new environment . Denies suicidal ideations . Denies medication side effects. Objective : I have discussed case with treatment team and have met with patient. 22 year old single female, college student, employed, admitted due to depression, recent increased anxiety and panic attacks, suicidal ideations. Today reports feeling " a little better", but presents vaguely dysphoric, irritable- acknowledges a vague subjective sense of irritability which she attributes to poor sleep. Denies medication side effects. No disruptive or agitated behaviors on unit. Limited milieu participation at this time.  Principal Problem: Depression Diagnosis:   Patient Active Problem List   Diagnosis Date Noted  . Major depressive disorder, recurrent severe without psychotic features ([place]) [F33.2] 07/31/2017   Total Time spent with patient: 20 minutes  Past Psychiatric History:   Past Medical History:  Past Medical History:  Diagnosis Date  . Anxiety   . Depression    History reviewed. No pertinent surgical history. Family History: History reviewed. No pertinent family history. Family Psychiatric  History:  Social History:  Social History   Substance and Sexual Activity  Alcohol Use Yes   Comment: occasional     Social History   Substance and Sexual Activity  Drug Use Yes  . Types: Marijuana    Social History   Socioeconomic History  . Marital status: Single    Spouse name: Not on file  . Number of children: Not on file  . Years of education: Not on file  . Highest education level: Not on file  Occupational History  . Not on file  Social Needs  . Financial resource  strain: Not on file  . Food insecurity:    Worry: Not on file    Inability: Not on file  . Transportation needs:    Medical: Not on file    Non-medical: Not on file  Tobacco Use  . Smoking status: Never Smoker  . Smokeless tobacco: Never Used  Substance and Sexual Activity  . Alcohol use: Yes    Comment: occasional  . Drug use: Yes    Types: Marijuana  . Sexual activity: Not on file  Lifestyle  . Physical activity:    Days per week: Not on file    Minutes per session: Not on file  . Stress: Not on file  Relationships  . Social connections:    Talks on phone: Not on file    Gets together: Not on file    Attends religious service: Not on file    Active member of club or organization: Not on file    Attends meetings of clubs or organizations: Not on file    Relationship status: Not on file  Other Topics Concern  . Not on file  Social History Narrative  . Not on file   Additional Social History:   Sleep: Fair  Appetite:  Fair  Current Medications: Current Facility-Administered Medications  Medication Dose Route Frequency Provider Last Rate Last Dose  . acetaminophen (TYLENOL) tablet 650 mg  650 mg Oral Q6H PRN Patrecia Pour, NP      . alum & mag hydroxide-simeth (MAALOX/MYLANTA) 200-200-20 MG/5ML suspension 30 mL  30 mL Oral Q4H PRN Patrecia Pour, NP      .  FLUoxetine (PROZAC) capsule 20 mg  20 mg Oral Daily Patrecia Pour, NP   20 mg at 08/01/17 1610  . hydrOXYzine (ATARAX/VISTARIL) tablet 25 mg  25 mg Oral Q6H PRN Keny Donald, Myer Peer, MD   25 mg at 07/31/17 2151  . magnesium hydroxide (MILK OF MAGNESIA) suspension 30 mL  30 mL Oral Daily PRN Patrecia Pour, NP      . propranolol (INDERAL) tablet 10 mg  10 mg Oral BID Pace Lamadrid, Myer Peer, MD   10 mg at 08/01/17 9604  . traZODone (DESYREL) tablet 50 mg  50 mg Oral QHS PRN Stan Cantave, Myer Peer, MD   50 mg at 07/31/17 2151    Lab Results:  Results for orders placed or performed during the hospital encounter of 07/31/17  (from the past 48 hour(s))  TSH     Status: None   Collection Time: 08/01/17  6:13 AM  Result Value Ref Range   TSH 2.034 0.350 - 4.500 uIU/mL    Comment: Performed by a 3rd Generation assay with a functional sensitivity of <=0.01 uIU/mL. Performed at [facility], [place] [address] [place], [place] [address]     Blood Alcohol level:  Lab Results  Component Value Date   ETH <10 11/91/4782    Metabolic Disorder Labs: No results found for: HGBA1C, MPG No results found for: PROLACTIN No results found for: CHOL, TRIG, HDL, CHOLHDL, VLDL, LDLCALC  Physical Findings: AIMS: Facial and Oral Movements Muscles of Facial Expression: None, normal Lips and Perioral Area: None, normal Jaw: None, normal Tongue: None, normal,Extremity Movements Upper (arms, wrists, hands, fingers): None, normal Lower (legs, knees, ankles, toes): None, normal, Trunk Movements Neck, shoulders, hips: None, normal, Overall Severity Severity of abnormal movements (highest score from questions above): None, normal Incapacitation due to abnormal movements: None, normal Patient's awareness of abnormal movements (rate only patient's report): No Awareness, Dental Status Current problems with teeth and/or dentures?: No Does patient usually wear dentures?: No  CIWA:    COWS:     Musculoskeletal: Strength & Muscle Tone: within normal limits Gait & Station: normal Patient leans: N/A  Psychiatric Specialty Exam: Physical Exam  ROS denies headache, no chest pain, no shortness of breath, no vomiting ,no fever. Reports vaginal discharge  Blood pressure (!) 154/137, pulse 85, temperature 99 F (37.2 C), temperature source Oral, resp. rate 16, height _0  (1.651 m), weight 81.6 kg (180 lb), SpO2 97 %.Body mass index is 29.95 kg/m.  General Appearance: Fairly Groomed  Eye Contact:  Good  Speech:  Normal Rate  Volume:  Normal  Mood:  reports partially improved mood, presents vaguely  dysphoric/depressed   Affect:  congruent, vaguely irritable, improves partially during session  Thought Process:  Linear and Descriptions of Associations: Intact  Orientation:  Full (Time, Place, and Person)  Thought Content:  denies hallucinations, no delusions, not internally preoccupied   Suicidal Thoughts:  No denies suicidal or self injurious ideations, denies homicidal or violent ideations, contracts for safety on unit  Homicidal Thoughts:  No  Memory:  recent and remote grossly intact   Judgement:  Fair  Insight:  Fair  Psychomotor Activity:  Normal  Concentration:  Concentration: Good and Attention Span: Good  Recall:  Good  Fund of Knowledge:  Good  Language:  Good  Akathisia:  Negative  Handed:  Right  AIMS (if indicated):     Assets:  Desire for Improvement Resilience  ADL's:  Intact  Cognition:  WNL  Sleep:  Assessment- patient describes partially improved mood, denies suicidal ideations at this time. Presents vaguely dysphoric and irritable which she attributes at least in part to poor sleep in the context of being in new surroundings. Affect does improve during session and is reactive . Thus far tolerating Prozac well, denies side effects. We discussed increasing Trazodone PRN dosing for insomnia but prefers to continue current dose for now. Patient reports GU symptoms ( discharge, discomfort, vague dysuria, and states she had recently been told she might have bacterial vaginosis)- interested in STD workup.    Treatment Plan Summary: Daily contact with patient to assess and evaluate symptoms and progress in treatment, Medication management, Plan inpatient treatment and medications as below Encourage group and milieu participation to work on coping skills and symptom reduction Continue Prozac 20 mgrs QDAY for depression Continue Trazodone 50 mgrs QHS PRN for insomnia Continue Vistaril 25 mgrs Q 6 hours PRN for anxiety  Will check UA,  and STD workup Treatment Team  working on disposition planning options Jenne Campus, MD 08/01/2017, 3:23 PM

## 2017-08-01 NOTE — Progress Notes (Signed)
Adult Psychoeducational Group Note  Date:  08/01/2017 Time: 1600  Group Topic/Focus: Psychoeducational activity with nurse. Pt engaged in a game of Bingo   Participation Level:  Did not attend.  

## 2017-08-01 NOTE — Progress Notes (Signed)
Pt presents with a flat affect and depressed mood. Pt appears cautious and forwards little information. Pt rated on her self inventory sheet: depression 0/10, anxiety 0/10 and hopelessness 0/10. Pt reports difficulty falling asleep last night but denies any sleep issues. Pt denies SI. Pt c/o cloudy urine and discharge with an odor. Pt met with MD and nurse to discuss symptoms. Pt verbally consent to STD screening. Pt reports tolerating meds well and denies any side effects to meds.   Orders reviewed with pt. Verbal support provided. Pt encouraged to attend groups. 15 minute checks performed for safety. Suicide risk assessment completed.   Pt compliant with tx plan.

## 2017-08-01 NOTE — Progress Notes (Signed)
Pt observed in the dayroom, attending wrap-up group. Pt appears anxious/depressed/flat in affect and mood. Pt denies SI/HI/AVH/Pain at this time. Pt states visitation went well. Pt states uncle and cousin was supportive. Workbook was given to Pt to complete. PRN vistaril and trazodone requested and given. Pt states it made her felt "drowsy" in the a.m. but she is willing to try it again tonight. Pt would like to discuss lab results with provider tomorrow. Will continue with POC.

## 2017-08-02 LAB — GC/CHLAMYDIA PROBE AMP (~~LOC~~) NOT AT ARMC
Chlamydia: NEGATIVE
Neisseria Gonorrhea: NEGATIVE

## 2017-08-02 LAB — HIV ANTIBODY (ROUTINE TESTING W REFLEX): HIV SCREEN 4TH GENERATION: NONREACTIVE

## 2017-08-02 LAB — RPR: RPR: NONREACTIVE

## 2017-08-02 MED ORDER — FLUOXETINE HCL 20 MG PO CAPS: 20.0000 mg | ORAL_CAPSULE | Freq: Every day | ORAL | 0 refills | Status: DC

## 2017-08-02 MED ORDER — PROPRANOLOL HCL 10 MG PO TABS: 10.0000 mg | ORAL_TABLET | Freq: Two times a day (BID) | ORAL | 0 refills | Status: DC

## 2017-08-02 MED ORDER — TRAZODONE HCL 50 MG PO TABS: 50.0000 mg | ORAL_TABLET | Freq: Every evening | ORAL | 0 refills | Status: DC | PRN

## 2017-08-02 MED ORDER — METRONIDAZOLE 500 MG PO TABS: 500.0000 mg | ORAL_TABLET | Freq: Two times a day (BID) | ORAL | 0 refills | Status: DC

## 2017-08-02 MED ORDER — HYDROXYZINE HCL 25 MG PO TABS: 25.0000 mg | ORAL_TABLET | Freq: Four times a day (QID) | ORAL | 0 refills | Status: DC | PRN

## 2017-08-02 NOTE — Progress Notes (Signed)
Recreation Therapy Notes  Date: 6.28.19 Time: 0930 Location: [address] Dayroom  Group Topic: Stress Management  Goal Area(s) Addresses:  Patient will verbalize importance of using healthy stress management.  Patient will identify positive emotions associated with healthy stress management.   Intervention: Stress Management  Activity :  [employer].  LRT introduced the stress management technique of meditation.  LRT read a script on how mountains stay the same in spite of the things that go on around them.  Education:  Stress Management, Discharge Planning.   Education Outcome: Acknowledges edcuation/In group clarification offered/Needs additional education  Clinical Observations/Feedback: Pt did not attend group.    Caroll RancherMarjette Swan Zayed, LRT/CTRS         Caroll RancherLindsay, Lenis Nettleton A 08/02/2017 12:18 PM

## 2017-08-02 NOTE — Progress Notes (Signed)
Discharge note:  Patient states, "I feel better."  She denies any depressive symptoms or thoughts of self harm.  She had a visit from her uncle and cousin last evening and states, "my uncle brought me flowers."  She plans on returning to her campus housing and continue with her counseling at [facility].  She received all personal belongings from unit and locker.  Reviewed AVS/transition record with patient and she indicates understanding.  She received prescriptions of her medications.  She left ambulatory with a family member.

## 2017-08-02 NOTE — Discharge Summary (Addendum)
Physician Discharge Summary Note  Patient:  Andrea Pugh is an 22 y.o., female MRN:  [id number] DOB:  August 13, 1995 Patient phone:  [phone] (home)  Patient address:   [address] [place] [address],  Total Time spent with patient: 20 minutes  Date of Admission:  07/31/2017 Date of Discharge: 08/02/2017  Reason for Admission:  22 year old single female, college student, employed, presented to ED via [facility], due to worsening depression and anxiety. States she had texted a friend about how depressed she was,and that friend became concerned and called 911. States she has had several episodes of  panic attacks recently , and feels her depression has also been worsening . She endorses neuro-vegetative symptoms as below, and also describes passive (but not active) thoughts of death, dying . Endorses neuro-vegetative symptoms as below. Reports that there have been no clear triggers for her panic attacks, but that seeing people " enjoying themselves, laughing" makes her feel upset and anxious because " why can't I feel that way". In addition to depression describes significant anxiety and describes symptoms of social anxiety. Associated Signs/Symptoms: Depression Symptoms:  depressed mood, anhedonia, insomnia, suicidal thoughts without plan, loss of energy/fatigue, (Hypo) Manic Symptoms: denies  Anxiety Symptoms:  Reports long history of social anxiety , and feels very anxious when having to interact or speak in front of others. Also describes panic attacks which she feels have become more frequent over recent weeks to months. Psychotic Symptoms: denies  PTSD Symptoms: Does not endorse  Total Time spent with patient: 45 minutes  Past Psychiatric History: no prior psychiatric admissions, no history of suicide attempts, history of an isolated episode of self cutting about a year ago, denies history of psychosis.  Prior Inpatient Therapy:  denies  Prior Outpatient  Therapy:  medications are prescribed by Dr. Jannifer [facility] at [facility]     Principal Problem: <principal problem not specified> Discharge Diagnoses: Patient Active Problem List   Diagnosis Date Noted  . Major depressive disorder, recurrent severe without psychotic features (HCC) [F33.2] 07/31/2017    Past Medical History:  Past Medical History:  Diagnosis Date  . Anxiety   . Depression    History reviewed. No pertinent surgical history. Family History: History reviewed. No pertinent family history. Family Psychiatric  History:reports younger sister has history of depression, no suicides in family, no history of substance abuse.   Social History:  Social History   Substance and Sexual Activity  Alcohol Use Yes   Comment: occasional     Social History   Substance and Sexual Activity  Drug Use Yes  . Types: Marijuana    Social History   Socioeconomic History  . Marital status: Single    Spouse name: Not on file  . Number of children: Not on file  . Years of education: Not on file  . Highest education level: Not on file  Occupational History  . Not on file  Social Needs  . Financial resource strain: Not on file  . Food insecurity:    Worry: Not on file    Inability: Not on file  . Transportation needs:    Medical: Not on file    Non-medical: Not on file  Tobacco Use  . Smoking status: Never Smoker  . Smokeless tobacco: Never Used  Substance and Sexual Activity  . Alcohol use: Yes    Comment: occasional  . Drug use: Yes    Types: Marijuana  . Sexual activity: Not on file  Lifestyle  .  Physical activity:    Days per week: Not on file    Minutes per session: Not on file  . Stress: Not on file  Relationships  . Social connections:    Talks on phone: Not on file    Gets together: Not on file    Attends religious service: Not on file    Active member of club or organization: Not on file    Attends meetings of clubs or organizations: Not on file     Relationship status: Not on file  Other Topics Concern  . Not on file  Social History Narrative  . Not on file    Hospital Course:  Charlaine DaltonQuentina Raczka was admitted for <principal problem not specified> and crisis management.  She was treated with the following medications Fluoxetine 20mg  po daily, and Trazodone 50mg  po qhs prn.  Charlaine DaltonQuentina Bose was discharged with current medication and was instructed on how to take medications as prescribed; (details listed below under Medication List).  Medical problems were identified and treated as needed.  Home medications were restarted as appropriate. Labs obtained during admission were normal.   Improvement was monitored by observation and Charlaine DaltonQuentina Norland daily report of symptom reduction.  Emotional and mental status was monitored by daily self-inventory reports completed by Charlaine DaltonQuentina Scatena and clinical staff.         Charlaine DaltonQuentina Boomer was evaluated by the treatment team for stability and plans for continued recovery upon discharge.  Charlaine DaltonQuentina Baillie motivation was an integral factor for scheduling further treatment.  Employment, transportation, bed availability, health status, family support, and any pending legal issues were also considered during her hospital stay.  She was offered further treatment options upon discharge including but not limited to Residential, Intensive Outpatient, and Outpatient treatment.  Charlaine DaltonQuentina Spaeth will follow up with the services as listed below under Follow Up Information.     Upon completion of this admission the Charlaine DaltonQuentina Chamberland was both mentally and medically stable for discharge denying suicidal/homicidal ideation, auditory/visual/tactile hallucinations, delusional thoughts and paranoia.     Physical Findings: AIMS: Facial and Oral Movements Muscles of Facial Expression: None, normal Lips and Perioral Area: None, normal Jaw: None, normal Tongue: None, normal,Extremity Movements Upper (arms, wrists, hands, fingers):  None, normal Lower (legs, knees, ankles, toes): None, normal, Trunk Movements Neck, shoulders, hips: None, normal, Overall Severity Severity of abnormal movements (highest score from questions above): None, normal Incapacitation due to abnormal movements: None, normal Patient's awareness of abnormal movements (rate only patient's report): No Awareness, Dental Status Current problems with teeth and/or dentures?: No Does patient usually wear dentures?: No  CIWA:    COWS:     Musculoskeletal: Strength & Muscle Tone: within normal limits Gait & Station: normal Patient leans: N/A  Psychiatric Specialty Exam: See MD SRA Physical Exam  ROS  Blood pressure 109/66, pulse 92, temperature 99 F (37.2 C), temperature source Oral, resp. rate 16, height 5\' 5"  (1.651 m), weight 81.6 kg (180 lb), SpO2 97 %.Body mass index is 29.95 kg/m.  Sleep:  Number of Hours: 6.75     Have you used any form of tobacco in the last 30 days? (Cigarettes, Smokeless Tobacco, Cigars, and/or Pipes): No  Has this patient used any form of tobacco in the last 30 days? (Cigarettes, Smokeless Tobacco, Cigars, and/or Pipes)  No  Blood Alcohol level:  Lab Results  Component Value Date   ETH <10 07/31/2017    Metabolic Disorder Labs:  No results found for: HGBA1C, MPG No results found  for: PROLACTIN No results found for: CHOL, TRIG, HDL, CHOLHDL, VLDL, LDLCALC  See Psychiatric Specialty Exam and Suicide Risk Assessment completed by Attending Physician prior to discharge.  Discharge destination:  Home  Is patient on multiple antipsychotic therapies at discharge:  No   Has Patient had three or more failed trials of antipsychotic monotherapy by history:  No  Recommended Plan for Multiple Antipsychotic Therapies: NA  Discharge Instructions    Discharge instructions   Complete by:  As directed    Please continue to take medications as directed. If your symptoms return, worsen, or persist please call your 911,  report to local ER, or contact crisis hotline. Please do not drink alcohol or use any illegal substances while taking prescription medications.     Allergies as of 08/02/2017   No Known Allergies     Medication List    TAKE these medications     Indication  FLUoxetine 20 MG capsule Commonly known as:  PROZAC Take 1 capsule (20 mg total) by mouth daily.  Indication:  Major Depressive Disorder   hydrOXYzine 25 MG tablet Commonly known as:  ATARAX/VISTARIL Take 1 tablet (25 mg total) by mouth every 6 (six) hours as needed for anxiety.  Indication:  Feeling Anxious   metroNIDAZOLE 500 MG tablet Commonly known as:  FLAGYL Take 1 tablet (500 mg total) by mouth 2 (two) times daily.  Indication:  Vaginosis caused by Bacteria   propranolol 10 MG tablet Commonly known as:  INDERAL Take 1 tablet (10 mg total) by mouth 2 (two) times daily. What changed:    medication strength  how much to take  when to take this  Indication:  Anxiety Related to Current Life Problems   traZODone 50 MG tablet Commonly known as:  DESYREL Take 1 tablet (50 mg total) by mouth at bedtime as needed for sleep.  Indication:  Trouble Sleeping        Follow-up recommendations:  Activity:  Increase your activity as tolerated.  Diet:  Regular house diet Tests:  routine test as suggested by outpatient pscyhiatrist Other:  Even if you begin to feel better continue taking you rmedication.   Signed: Truman Hayward, FNP 08/02/2017, 10:19 AM   Patient seen, Suicide Assessment Completed.  Disposition Plan Reviewed

## 2017-08-02 NOTE — Plan of Care (Signed)
  Problem: Education: Goal: Knowledge of [facility] Education information/materials will improve Outcome: Completed/Met Goal: Emotional status will improve Outcome: Completed/Met Goal: Mental status will improve Outcome: Completed/Met Goal: Verbalization of understanding the information provided will improve Outcome: Completed/Met   Problem: Activity: Goal: Interest or engagement in activities will improve Outcome: Completed/Met Goal: Sleeping patterns will improve Outcome: Completed/Met   Problem: Coping: Goal: Ability to verbalize frustrations and anger appropriately will improve Outcome: Completed/Met Goal: Ability to demonstrate self-control will improve Outcome: Completed/Met   Problem: Health Behavior/Discharge Planning: Goal: Identification of resources available to assist in meeting health care needs will improve Outcome: Completed/Met Goal: Compliance with treatment plan for underlying cause of condition will improve Outcome: Completed/Met   Problem: Physical Regulation: Goal: Ability to maintain clinical measurements within normal limits will improve Outcome: Completed/Met   Problem: Safety: Goal: Periods of time without injury will increase Outcome: Completed/Met   Problem: Activity: Goal: Will identify at least one activity in which they can participate Outcome: Completed/Met   Problem: Coping: Goal: Ability to identify and develop effective coping behavior will improve Outcome: Completed/Met Goal: Ability to interact with others will improve Outcome: Completed/Met Goal: Demonstration of participation in decision-making regarding own care will improve Outcome: Completed/Met Goal: Ability to use eye contact when communicating with others will improve Outcome: Completed/Met   Problem: Health Behavior/Discharge Planning: Goal: Identification of resources available to assist in meeting health care needs will improve Outcome: Completed/Met    Problem: Self-Concept: Goal: Will verbalize positive feelings about self Outcome: Completed/Met   Problem: Education: Goal: Ability to state activities that reduce stress will improve Outcome: Completed/Met   Problem: Coping: Goal: Ability to identify and develop effective coping behavior will improve Outcome: Completed/Met   Problem: Self-Concept: Goal: Ability to identify factors that promote anxiety will improve Outcome: Completed/Met Goal: Level of anxiety will decrease Outcome: Completed/Met Goal: Ability to modify response to factors that promote anxiety will improve Outcome: Completed/Met   Problem: Education: Goal: Ability to make informed decisions regarding treatment will improve Outcome: Completed/Met   Problem: Coping: Goal: Coping ability will improve Outcome: Completed/Met   Problem: Health Behavior/Discharge Planning: Goal: Identification of resources available to assist in meeting health care needs will improve Outcome: Completed/Met   Problem: Medication: Goal: Compliance with prescribed medication regimen will improve Outcome: Completed/Met   Problem: Self-Concept: Goal: Ability to disclose and discuss suicidal ideas will improve Outcome: Completed/Met Goal: Will verbalize positive feelings about self Outcome: Completed/Met

## 2017-08-02 NOTE — BHH Suicide Risk Assessment (Signed)
[facility] Discharge Suicide Risk Assessment   Principal Problem:  Depression Discharge Diagnoses:  Patient Active Problem List   Diagnosis Date Noted  . Major depressive disorder, recurrent severe without psychotic features (HCC) [F33.2] 07/31/2017    Total Time spent with patient: 30 minutes  Musculoskeletal: Strength & Muscle Tone: within normal limits Gait & Station: normal Patient leans: N/A  Psychiatric Specialty Exam: ROS-denies chest pain, no shortness of breath, no vomiting, no rash  Blood pressure 104/69, pulse 79, temperature 99.1 F (37.3 C), temperature source Oral, resp. rate 16, height 5\' 5"  (1.651 m), weight 81.6 kg (180 lb), SpO2 97 %.Body mass index is 29.95 kg/m.  General Appearance: Improving grooming  Eye Contact::  Good  Speech:  Normal Rate409  Volume:  Normal  Mood:  Reports she is feeling better, minimizes depression at this time  Affect:  Vaguely constricted, improves during session, becomes brighter  Thought Process:  Linear and Descriptions of Associations: Intact  Orientation:  Full (Time, Place, and Person)  Thought Content:  No hallucinations, no delusions, not internally preoccupied  Suicidal Thoughts:  No denies suicidal ideations, no self-injurious ideations, no homicidal ideations  Homicidal Thoughts:  No  Memory:  Recent and remote grossly intact  Judgement:  Other:  Improving  Insight:  Improving  Psychomotor Activity:  Normal  Concentration:  Good  Recall:  Good  Fund of Knowledge:Good  Language: Good  Akathisia:  Negative  Handed:  Right  AIMS (if indicated):     Assets:  Communication Skills Desire for Improvement Resilience  Sleep:  Number of Hours: 6.75  Cognition: WNL  ADL's:  Intact   Mental Status Per Nursing Assessment::   On Admission:  Suicidal ideation indicated by patient  Demographic Factors:  22 year old single female, college student  Loss Factors: Financial and employment related stressors  Historical  Factors: No prior psychiatric admissions, no history of suicide attempts, history of isolated episode of self cutting a year ago, history of social anxiety symptoms  Risk Reduction Factors:   Employed and Positive coping skills or problem solving skills  Continued Clinical Symptoms:  At this time patient is alert, attentive, pleasant on approach, reports mood is improved, denies feeling depressed at this time, affect remains vaguely constricted but is reactive, smiles at times appropriately, no thought disorder, not suicidal or homicidal, denies self-injurious ideations, no hallucinations, no delusions, future oriented. Currently denies medication side effects, we have reviewed side effect profile. Behavioral unit in good control, pleasant on approach.  Cognitive Features That Contribute To Risk:  No gross cognitive deficits noted upon discharge. Is alert , attentive, and oriented x 3   Suicide Risk:  Mild:  Suicidal ideation of limited frequency, intensity, duration, and specificity.  There are no identifiable plans, no associated intent, mild dysphoria and related symptoms, good self-control (both objective and subjective assessment), few other risk factors, and identifiable protective factors, including available and accessible social support.  Follow-up Information    [facility]. Go on 08/05/2017.   Why:  Appointment with Dr. Jannifer Franklin at 6 PM.  Contact information: [address] [place], [place], [place] [address] Phone: [phone] Fax: [phone]          Plan Of Care/Follow-up recommendations:  Activity:  As tolerated Diet:  Regular Tests:  NA Other:  See below  Patient is requesting discharge and there are currently no grounds for involuntary commitment, she is leaving unit in good spirits. Plans to return home. Plans to follow-up as above.  Madaline Guthrie A  Michaelangelo Mittelman, MD 08/02/2017, 12:59 PM
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
## 2017-08-03 LAB — URINE CULTURE: Special Requests: NORMAL

## 2017-09-06 ENCOUNTER — Encounter

## 2018-05-14 ENCOUNTER — Encounter (HOSPITAL_COMMUNITY): Payer: Self-pay | Admitting: *Deleted

## 2018-05-14 ENCOUNTER — Other Ambulatory Visit: Payer: Self-pay

## 2018-05-14 ENCOUNTER — Emergency Department (HOSPITAL_COMMUNITY)
Admission: EM | Admit: 2018-05-14 | Discharge: 2018-05-15 | Disposition: A | Discharge status: Home / Self Care | Payer: BLUE CROSS/BLUE SHIELD | Attending: Emergency Medicine | Admitting: Emergency Medicine

## 2018-05-14 ENCOUNTER — Emergency Department (HOSPITAL_COMMUNITY): Payer: BLUE CROSS/BLUE SHIELD

## 2018-05-14 DIAGNOSIS — Z79899 Other long term (current) drug therapy: Secondary | ICD-10-CM | POA: Insufficient documentation

## 2018-05-14 DIAGNOSIS — S20311A Abrasion of right front wall of thorax, initial encounter: Secondary | ICD-10-CM | POA: Diagnosis not present

## 2018-05-14 DIAGNOSIS — R1032 Left lower quadrant pain: Secondary | ICD-10-CM | POA: Diagnosis not present

## 2018-05-14 DIAGNOSIS — Y9389 Activity, other specified: Secondary | ICD-10-CM | POA: Diagnosis not present

## 2018-05-14 DIAGNOSIS — Y998 Other external cause status: Secondary | ICD-10-CM | POA: Insufficient documentation

## 2018-05-14 DIAGNOSIS — F1210 Cannabis abuse, uncomplicated: Secondary | ICD-10-CM | POA: Diagnosis not present

## 2018-05-14 DIAGNOSIS — S199XXA Unspecified injury of neck, initial encounter: Secondary | ICD-10-CM | POA: Diagnosis present

## 2018-05-14 DIAGNOSIS — Y92410 Unspecified street and highway as the place of occurrence of the external cause: Secondary | ICD-10-CM | POA: Diagnosis not present

## 2018-05-14 DIAGNOSIS — S161XXA Strain of muscle, fascia and tendon at neck level, initial encounter: Secondary | ICD-10-CM | POA: Insufficient documentation

## 2018-05-14 DIAGNOSIS — M25511 Pain in right shoulder: Secondary | ICD-10-CM

## 2018-05-14 LAB — CBC
HCT: 37.6 % (ref 36.0–46.0)
Hemoglobin: 12.8 g/dL (ref 12.0–15.0)
MCH: 31.5 pg (ref 26.0–34.0)
MCHC: 34 g/dL (ref 30.0–36.0)
MCV: 92.6 fL (ref 80.0–100.0)
Platelets: 314 10*3/uL (ref 150–400)
RBC: 4.06 MIL/uL (ref 3.87–5.11)
RDW: 12.6 % (ref 11.5–15.5)
WBC: 6 10*3/uL (ref 4.0–10.5)
nRBC: 0 % (ref 0.0–0.2)

## 2018-05-14 LAB — BASIC METABOLIC PANEL
Anion gap: 8 (ref 5–15)
BUN: 13 mg/dL (ref 6–20)
CO2: 24 mmol/L (ref 22–32)
Calcium: 9.2 mg/dL (ref 8.9–10.3)
Chloride: 105 mmol/L (ref 98–111)
Creatinine, Ser: 0.67 mg/dL (ref 0.44–1.00)
GFR calc Af Amer: >60 mL/min (ref >60)
GFR calc non Af Amer: >60 mL/min (ref >60)
Glucose, Bld: 156 mg/dL — ABNORMAL HIGH (ref 70–99)
Potassium: 3.8 mmol/L (ref 3.5–5.1)
Sodium: 137 mmol/L (ref 135–145)

## 2018-05-14 LAB — I-STAT BETA HCG BLOOD, ED (MC, WL, AP ONLY): I-stat hCG, quantitative: <5 m[IU]/mL (ref <5)

## 2018-05-14 MED ORDER — HYDROMORPHONE HCL 1 MG/ML IJ SOLN
0.5000 mg | Freq: Once | INTRAMUSCULAR | Status: AC
  Administered 2018-05-14: 0.5 mg via INTRAVENOUS
  Filled 2018-05-14: qty 1

## 2018-05-14 MED ORDER — SODIUM CHLORIDE 0.9 % IV SOLN
INTRAVENOUS | Status: DC
  Administered 2018-05-14: 22:00:00 via INTRAVENOUS

## 2018-05-14 MED ORDER — ONDANSETRON HCL 4 MG/2ML IJ SOLN
4.0000 mg | Freq: Once | INTRAMUSCULAR | Status: AC
  Administered 2018-05-14: 22:00:00 4 mg via INTRAVENOUS
  Filled 2018-05-14: qty 2

## 2018-05-14 MED ORDER — HYDROMORPHONE HCL 1 MG/ML IJ SOLN: 1.0000 mg | Freq: Once | INTRAMUSCULAR | Status: DC

## 2018-05-14 MED ORDER — NAPROXEN 500 MG PO TABS: 500.0000 mg | ORAL_TABLET | Freq: Two times a day (BID) | ORAL | 0 refills | Status: DC

## 2018-05-14 MED ORDER — SODIUM CHLORIDE (PF) 0.9 % IJ SOLN
INTRAMUSCULAR | Status: AC
  Filled 2018-05-14: qty 50

## 2018-05-14 MED ORDER — IOHEXOL 300 MG/ML  SOLN
100.0000 mL | Freq: Once | INTRAMUSCULAR | Status: AC | PRN
  Administered 2018-05-14: 100 mL via INTRAVENOUS
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
## 2018-05-14 NOTE — ED Notes (Signed)
Patient transported to X-ray 
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
## 2018-05-14 NOTE — Discharge Instructions (Signed)
Take the Naprosyn as directed.  Expect to be sore and stiff for the next 2 days.  Wash the abrasions with soap and water daily.  If the right shoulder does not improve over the next 7 to 10 days.  Make an appointment to follow-up with orthopedics.  X-ray of that shoulder had no bony injuries.  Work note provided.
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
## 2018-05-15 DIAGNOSIS — S161XXA Strain of muscle, fascia and tendon at neck level, initial encounter: Secondary | ICD-10-CM | POA: Diagnosis not present

## 2018-05-15 NOTE — ED Notes (Addendum)
Patient tolerated fluid challenge and able ambulated in the hallway.

## 2019-03-18 ENCOUNTER — Ambulatory Visit (HOSPITAL_COMMUNITY)
Admission: RE | Admit: 2019-03-18 | Discharge: 2019-03-18 | Disposition: A | Discharge status: Home / Self Care | Payer: No Typology Code available for payment source | Attending: Psychiatry | Admitting: Psychiatry

## 2019-03-18 DIAGNOSIS — F329 Major depressive disorder, single episode, unspecified: Secondary | ICD-10-CM | POA: Insufficient documentation

## 2019-03-18 DIAGNOSIS — F22 Delusional disorders: Secondary | ICD-10-CM | POA: Insufficient documentation

## 2019-03-18 DIAGNOSIS — F419 Anxiety disorder, unspecified: Secondary | ICD-10-CM | POA: Insufficient documentation

## 2019-03-18 DIAGNOSIS — Z1389 Encounter for screening for other disorder: Secondary | ICD-10-CM | POA: Insufficient documentation

## 2019-03-18 DIAGNOSIS — Z62810 Personal history of physical and sexual abuse in childhood: Secondary | ICD-10-CM | POA: Insufficient documentation

## 2019-03-18 DIAGNOSIS — F332 Major depressive disorder, recurrent severe without psychotic features: Secondary | ICD-10-CM

## 2019-03-19 ENCOUNTER — Encounter (HOSPITAL_COMMUNITY): Payer: Self-pay | Admitting: Psychiatry

## 2019-03-19 ENCOUNTER — Ambulatory Visit (HOSPITAL_COMMUNITY)
Admission: RE | Admit: 2019-03-19 | Discharge: 2019-03-19 | Disposition: A | Discharge status: Home / Self Care | Payer: No Typology Code available for payment source | Attending: Psychiatry | Admitting: Psychiatry

## 2019-03-19 DIAGNOSIS — F410 Panic disorder [episodic paroxysmal anxiety] without agoraphobia: Secondary | ICD-10-CM | POA: Insufficient documentation

## 2019-03-19 DIAGNOSIS — Z79899 Other long term (current) drug therapy: Secondary | ICD-10-CM | POA: Insufficient documentation

## 2019-03-19 DIAGNOSIS — F4310 Post-traumatic stress disorder, unspecified: Secondary | ICD-10-CM | POA: Insufficient documentation

## 2019-03-19 DIAGNOSIS — F333 Major depressive disorder, recurrent, severe with psychotic symptoms: Secondary | ICD-10-CM | POA: Insufficient documentation

## 2019-03-19 DIAGNOSIS — F419 Anxiety disorder, unspecified: Secondary | ICD-10-CM | POA: Insufficient documentation
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
## 2019-04-01 ENCOUNTER — Encounter (HOSPITAL_COMMUNITY): Payer: Self-pay | Admitting: Psychiatry

## 2019-04-01 ENCOUNTER — Inpatient Hospital Stay (HOSPITAL_COMMUNITY)
Admission: AD | Admit: 2019-04-01 | Discharge: 2019-04-06 | DRG: 885 | Disposition: A | Discharge status: Home / Self Care | Payer: Federal, State, Local not specified - Other | Attending: Psychiatry | Admitting: Psychiatry

## 2019-04-01 ENCOUNTER — Other Ambulatory Visit: Payer: Self-pay

## 2019-04-01 DIAGNOSIS — R45851 Suicidal ideations: Secondary | ICD-10-CM | POA: Diagnosis present

## 2019-04-01 DIAGNOSIS — F332 Major depressive disorder, recurrent severe without psychotic features: Secondary | ICD-10-CM | POA: Diagnosis present

## 2019-04-01 DIAGNOSIS — F333 Major depressive disorder, recurrent, severe with psychotic symptoms: Principal | ICD-10-CM

## 2019-04-01 DIAGNOSIS — R000 Tachycardia, unspecified: Secondary | ICD-10-CM | POA: Diagnosis present

## 2019-04-01 DIAGNOSIS — F4010 Social phobia, unspecified: Secondary | ICD-10-CM | POA: Diagnosis present

## 2019-04-01 DIAGNOSIS — Z20822 Contact with and (suspected) exposure to covid-19: Secondary | ICD-10-CM | POA: Diagnosis present

## 2019-04-01 DIAGNOSIS — F23 Brief psychotic disorder: Secondary | ICD-10-CM | POA: Diagnosis not present

## 2019-04-01 DIAGNOSIS — F5105 Insomnia due to other mental disorder: Secondary | ICD-10-CM | POA: Diagnosis present

## 2019-04-01 DIAGNOSIS — F411 Generalized anxiety disorder: Secondary | ICD-10-CM | POA: Diagnosis present

## 2019-04-01 DIAGNOSIS — F29 Unspecified psychosis not due to a substance or known physiological condition: Secondary | ICD-10-CM | POA: Diagnosis present

## 2019-04-01 LAB — RESPIRATORY PANEL BY RT PCR (FLU A&B, COVID)
Influenza A by PCR: NEGATIVE
Influenza B by PCR: NEGATIVE
SARS Coronavirus 2 by RT PCR: NEGATIVE

## 2019-04-01 MED ORDER — HYDROXYZINE HCL 25 MG PO TABS
25.0000 mg | ORAL_TABLET | Freq: Three times a day (TID) | ORAL | Status: DC | PRN
  Administered 2019-04-05: 23:00:00 25 mg via ORAL
  Filled 2019-04-01: qty 1
  Filled 2019-04-01: qty 10

## 2019-04-01 MED ORDER — QUETIAPINE FUMARATE 50 MG PO TABS
50.0000 mg | ORAL_TABLET | Freq: Every day | ORAL | Status: DC
  Administered 2019-04-01 – 2019-04-02 (×2): 50 mg via ORAL
  Filled 2019-04-01 (×4): qty 1

## 2019-04-01 MED ORDER — TRAZODONE HCL 50 MG PO TABS: 50.0000 mg | ORAL_TABLET | Freq: Every evening | ORAL | Status: DC | PRN

## 2019-04-01 MED ORDER — ESCITALOPRAM OXALATE 10 MG PO TABS
10.0000 mg | ORAL_TABLET | Freq: Every day | ORAL | Status: DC
  Administered 2019-04-02 – 2019-04-06 (×5): 10 mg via ORAL
  Filled 2019-04-01 (×9): qty 1

## 2019-04-01 MED ORDER — ALUM & MAG HYDROXIDE-SIMETH 200-200-20 MG/5ML PO SUSP: 30.0000 mL | ORAL | Status: DC | PRN

## 2019-04-01 MED ORDER — MAGNESIUM HYDROXIDE 400 MG/5ML PO SUSP: 30.0000 mL | Freq: Every day | ORAL | Status: DC | PRN

## 2019-04-01 MED ORDER — ACETAMINOPHEN 325 MG PO TABS: 650.0000 mg | ORAL_TABLET | Freq: Four times a day (QID) | ORAL | Status: DC | PRN
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
## 2019-04-01 NOTE — Progress Notes (Signed)
Patient ID: Andrea Pugh, female   DOB: 12-13-1995, 24 y.o.   MRN: [id number]  [place] NOVEL CORONAVIRUS (COVID-19) DAILY CHECK-OFF SYMPTOMS - answer yes or no to each - every day NO YES  Have you had a fever in the past 24 hours?  . Fever (Temp > 37.80C / 100F) X   Have you had any of these symptoms in the past 24 hours? . New Cough .  Sore Throat  .  Shortness of Breath .  Difficulty Breathing .  Unexplained Body Aches   X   Have you had any one of these symptoms in the past 24 hours not related to allergies?   . Runny Nose .  Nasal Congestion .  Sneezing   X   If you have had runny nose, nasal congestion, sneezing in the past 24 hours, has it worsened?  X   EXPOSURES - check yes or no X   Have you traveled outside the state in the past 14 days?  X   Have you been in contact with someone with a confirmed diagnosis of COVID-19 or PUI in the past 14 days without wearing appropriate PPE?  X   Have you been living in the same home as a person with confirmed diagnosis of COVID-19 or a PUI (household contact)?    X   Have you been diagnosed with COVID-19?    X              What to do next: Answered NO to all: Answered YES to anything:   Proceed with unit schedule Follow the [facility] Inpatient Flowsheet.
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
## 2019-04-01 NOTE — BHH Group Notes (Signed)
Pt attended AA Zoom meeting via ipad 

## 2019-04-01 NOTE — Progress Notes (Addendum)
Patient ID: Andrea Pugh, female   DOB: [birth year], 24 y.o.   MRN: [id number]   Pt alert and oriented on the Observation unit. Pt given sandwich and is currently laying on her bed, and she is pleasant and cooperative. COVID test completed, DASH courier called, and q15 safety checks in place. Pt was provided support and encouragement. Pt is safe on the unit.
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
## 2019-04-01 NOTE — Tx Team (Signed)
Initial Treatment Plan 04/01/2019 7:16 PM Andrea Pugh YCX:[id number]    PATIENT STRESSORS: Financial difficulties Medication change or noncompliance   PATIENT STRENGTHS: Average or above average intelligence Capable of independent living Work skills   PATIENT IDENTIFIED PROBLEMS: "Anxiety"  "Depression"  "hearing voices around crowded areas and people"  Social anxiety  "Financial problems"             DISCHARGE CRITERIA:  Improved stabilization in mood, thinking, and/or behavior Verbal commitment to aftercare and medication compliance  PRELIMINARY DISCHARGE PLAN: Outpatient therapy Return to previous living arrangement Return to previous work or school arrangements  PATIENT/FAMILY INVOLVEMENT: This treatment plan has been presented to and reviewed with the patient, Andrea Pugh, and/or family member.  The patient and family have been given the opportunity to ask questions and make suggestions.  Tania Ade, RN 04/01/2019, 7:16 PM

## 2019-04-02 DIAGNOSIS — F23 Brief psychotic disorder: Secondary | ICD-10-CM

## 2019-04-02 LAB — URINALYSIS, ROUTINE W REFLEX MICROSCOPIC
Bilirubin Urine: NEGATIVE
Glucose, UA: NEGATIVE mg/dL
Hgb urine dipstick: NEGATIVE
Ketones, ur: NEGATIVE mg/dL
Leukocytes,Ua: NEGATIVE
Nitrite: NEGATIVE
Protein, ur: NEGATIVE mg/dL
Specific Gravity, Urine: 1.025 (ref 1.005–1.030)
pH: 6 (ref 5.0–8.0)

## 2019-04-02 LAB — RAPID URINE DRUG SCREEN, HOSP PERFORMED
Amphetamines: NOT DETECTED
Barbiturates: NOT DETECTED
Benzodiazepines: NOT DETECTED
Cocaine: NOT DETECTED
Opiates: NOT DETECTED
Tetrahydrocannabinol: NOT DETECTED

## 2019-04-02 LAB — LIPID PANEL
Cholesterol: 167 mg/dL (ref 0–200)
HDL: 45 mg/dL (ref >40)
LDL Cholesterol: 113 mg/dL — ABNORMAL HIGH (ref 0–99)
Total CHOL/HDL Ratio: 3.7 RATIO
Triglycerides: 44 mg/dL (ref <150)
VLDL: 9 mg/dL (ref 0–40)

## 2019-04-02 LAB — COMPREHENSIVE METABOLIC PANEL
ALT: 15 U/L (ref 0–44)
AST: 15 U/L (ref 15–41)
Albumin: 4 g/dL (ref 3.5–5.0)
Alkaline Phosphatase: 54 U/L (ref 38–126)
Anion gap: 9 (ref 5–15)
BUN: 10 mg/dL (ref 6–20)
CO2: 28 mmol/L (ref 22–32)
Calcium: 8.9 mg/dL (ref 8.9–10.3)
Chloride: 103 mmol/L (ref 98–111)
Creatinine, Ser: 0.66 mg/dL (ref 0.44–1.00)
GFR calc Af Amer: >60 mL/min (ref >60)
GFR calc non Af Amer: >60 mL/min (ref >60)
Glucose, Bld: 92 mg/dL (ref 70–99)
Potassium: 3.8 mmol/L (ref 3.5–5.1)
Sodium: 140 mmol/L (ref 135–145)
Total Bilirubin: 0.8 mg/dL (ref 0.3–1.2)
Total Protein: 7.3 g/dL (ref 6.5–8.1)

## 2019-04-02 LAB — CBC
HCT: 39.2 % (ref 36.0–46.0)
Hemoglobin: 13.3 g/dL (ref 12.0–15.0)
MCH: 31.6 pg (ref 26.0–34.0)
MCHC: 33.9 g/dL (ref 30.0–36.0)
MCV: 93.1 fL (ref 80.0–100.0)
Platelets: 344 10*3/uL (ref 150–400)
RBC: 4.21 MIL/uL (ref 3.87–5.11)
RDW: 12.8 % (ref 11.5–15.5)
WBC: 4.2 10*3/uL (ref 4.0–10.5)
nRBC: 0 % (ref 0.0–0.2)

## 2019-04-02 LAB — HEMOGLOBIN A1C
Hgb A1c MFr Bld: 5.6 % (ref 4.8–5.6)
Mean Plasma Glucose: 114.02 mg/dL

## 2019-04-02 LAB — PREGNANCY, URINE: Preg Test, Ur: NEGATIVE

## 2019-04-02 LAB — TSH: TSH: 1.059 u[IU]/mL (ref 0.350–4.500)

## 2019-04-02 LAB — ETHANOL: Alcohol, Ethyl (B): <10 mg/dL (ref <10)

## 2019-04-02 MED ORDER — TRAZODONE HCL 50 MG PO TABS
25.0000 mg | ORAL_TABLET | Freq: Every evening | ORAL | Status: DC | PRN
  Administered 2019-04-05: 23:00:00 25 mg via ORAL
  Filled 2019-04-02: qty 4
  Filled 2019-04-02 (×2): qty 1
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
## 2019-04-02 NOTE — Progress Notes (Signed)
D:  Patient denied SI and HI, contracts for safety.  Denied A/V hallucinations.  Denied pain. A:  Medications administered per MD orders.  Emotional support and encouragement given patient. R:  Safety maintained with 15 minute checks.  

## 2019-04-02 NOTE — Progress Notes (Signed)
   04/01/19 2133  Psych Admission Type (Psych Patients Only)  Admission Status Voluntary  Psychosocial Assessment  Patient Complaints Depression;Worrying;Anxiety  Eye Contact Fair  Facial Expression Flat  Affect Appropriate to circumstance  Speech Logical/coherent  Interaction Minimal  Appearance/Hygiene Unremarkable  Behavior Characteristics Cooperative;Appropriate to situation  Mood Depressed;Anxious  Thought Process  Coherency WDL  Content WDL  Delusions WDL  Perception WDL  Hallucination None reported or observed  Judgment WDL  Confusion WDL  Danger to Self  Current suicidal ideation? Denies  Danger to Others  Danger to Others None reported or observed

## 2019-04-02 NOTE — Progress Notes (Signed)
Adult Psychoeducational Group Note  Date:  04/02/2019 Time:  10:36 AM  Group Topic/Focus:  Crisis Planning:   The purpose of this group is to help patients create a crisis plan for use upon discharge or in the future, as needed.  Participation Level:  Active  Participation Quality:  Appropriate  Affect:  Appropriate  Cognitive:  Alert  Insight: Appropriate  Engagement in Group:  Engaged  Modes of Intervention:  Discussion  Additional Comments:    This staff member had a 1 on 1 session with this pt. Pt discussed her plans to prevent relapse and what she would like to get out of her stay here. Pt states that she would like help and feels like nothing has helped. Pt states she she previously came to [facility] as a walk in twice but was not admitted to in patient. Pt was given outpatient resources that she states were not helpful. She hope to learn some skills that she can use after she leaves during her stay here.   Karren Cobble 04/02/2019, 10:36 AM

## 2019-04-02 NOTE — BHH Suicide Risk Assessment (Signed)
[facility] Admission Suicide Risk Assessment   Nursing information obtained from:    Demographic factors:    Current Mental Status:    Loss Factors:    Historical Factors:    Risk Reduction Factors:     Total Time spent with patient: 30 minutes Principal Problem: <principal problem not specified> Diagnosis:  Active Problems:   MDD (major depressive disorder), recurrent episode, severe (HCC)  Subjective Data: Patient is seen and examined.  Patient is a 24 year old female with a past psychiatric history significant for depression and anxiety who presented to the behavioral health hospital on voluntary basis on 04/01/2019 with suicidal ideation and worsening depression.  The patient stated that she has been working in a [employer].  She stated that some of her coworkers were making comments.  She felt very suspicious about what they were saying.  She stated that they had accused her of stealing money from the cash register and making other problems at work.  This morning she stated that "I think they were just kidding but it may be worried".  She also talked about having auditory hallucinations which were primarily in her head, but she also noted that she had heard her neighbors voices while they were in their home.  The patient's last psychiatric admission at our facility was on July 31, 2017.  She was admitted at that time because of worsening depression and anxiety.  She also complained of panic attacks at that time.  There was a mention of a social anxiety disorder and dad admission H&P, and the patient stated day with her past psychiatric history that she had social anxiety.  She denied any psychotic symptoms at that time, she also denied any previous physical, emotional or sexual trauma.  She admitted at that time that she had some isolated episodes of self cutting approximately a year prior to that admission.  She denied any drugs or alcohol.  She was admitted to the hospital for evaluation and  stabilization.  Continued Clinical Symptoms:    The "Alcohol Use Disorders Identification Test", Guidelines for Use in Primary Care, Second Edition.  World Pharmacologist [facility]). Score between 0-7:  no or low risk or alcohol related problems. Score between 8-15:  moderate risk of alcohol related problems. Score between 16-19:  high risk of alcohol related problems. Score 20 or above:  warrants further diagnostic evaluation for alcohol dependence and treatment.   CLINICAL FACTORS:   Severe Anxiety and/or Agitation Panic Attacks Depression:   Anhedonia Hopelessness Impulsivity Insomnia More than one psychiatric diagnosis Previous Psychiatric Diagnoses and Treatments   Musculoskeletal: Strength & Muscle Tone: within normal limits Gait & Station: normal Patient leans: N/A  Psychiatric Specialty Exam: Physical Exam  Nursing note and vitals reviewed. Constitutional: She is oriented to person, place, and time. She appears well-developed and well-nourished.  HENT:  Head: Normocephalic and atraumatic.  Respiratory: Effort normal.  Neurological: She is alert and oriented to person, place, and time.    Review of Systems  Blood pressure 108/71, pulse (!) 105, temperature 97.9 F (36.6 C), temperature source Oral, resp. rate 16, height 5\' 5"  (1.651 m), weight 88.5 kg, SpO2 98 %.Body mass index is 32.45 kg/m.  General Appearance: Disheveled  Eye Contact:  Fair  Speech:  Slow  Volume:  Decreased  Mood:  Anxious and Depressed  Affect:  Congruent  Thought Process:  Coherent and Descriptions of Associations: Loose  Orientation:  Full (Time, Place, and Person)  Thought Content:  Delusions and Hallucinations:  Auditory  Suicidal Thoughts:  Yes.  without intent/plan  Homicidal Thoughts:  No  Memory:  Immediate;   Fair Recent;   Fair Remote;   Fair  Judgement:  Intact  Insight:  Fair  Psychomotor Activity:  Psychomotor Retardation  Concentration:  Concentration: Fair and  Attention Span: Fair  Recall:  [employer] of Knowledge:  Fair  Language:  Fair  Akathisia:  Negative  Handed:  Right  AIMS (if indicated):     Assets:  Desire for Improvement Housing Resilience Vocational/Educational  ADL's:  Intact  Cognition:  WNL  Sleep:  Number of Hours: 6.75      COGNITIVE FEATURES THAT CONTRIBUTE TO RISK:  None    SUICIDE RISK:   Mild:  Suicidal ideation of limited frequency, intensity, duration, and specificity.  There are no identifiable plans, no associated intent, mild dysphoria and related symptoms, good self-control (both objective and subjective assessment), few other risk factors, and identifiable protective factors, including available and accessible social support.  PLAN OF CARE: Patient is seen and examined.  Patient is a 24 year old female with the above-stated past psychiatric history is admitted secondary to worsening depression, worsening anxiety and suicidal ideation.  She will be admitted to the hospital.  She will be integrated into the milieu.  She will be encouraged to attend groups.  On admission she was started on Lexapro 10 mg p.o. daily.  I also started her on Seroquel last night because of the reported auditory hallucinations.  She stated that the 50 mg was a bit too much for her, and I am to reduce that to 25 mg p.o. nightly.  She will also have available hydroxyzine for anxiety as well as trazodone for sleep.  She denied any drugs or alcohol and her drug screen on admission was negative.  Her electrolytes including liver function enzymes were normal, lipid panel was essentially normal, CBC with differential was normal.  Her hemoglobin A1c was 5.6.  Urine pregnancy test was negative.  Urine was essentially normal.  Blood alcohol was less than 10.  Her vital signs are stable, and she is afebrile.  I certify that inpatient services furnished can reasonably be expected to improve the patient's condition.   Antonieta Pert, MD 04/02/2019,  9:06 AM

## 2019-04-03 DIAGNOSIS — F29 Unspecified psychosis not due to a substance or known physiological condition: Secondary | ICD-10-CM | POA: Diagnosis present

## 2019-04-03 MED ORDER — RISPERIDONE 0.5 MG PO TABS
0.5000 mg | ORAL_TABLET | Freq: Every day | ORAL | Status: DC
  Administered 2019-04-03 – 2019-04-06 (×4): 0.5 mg via ORAL
  Filled 2019-04-03 (×6): qty 1

## 2019-04-03 MED ORDER — RISPERIDONE 1 MG PO TABS
1.0000 mg | ORAL_TABLET | Freq: Every day | ORAL | Status: DC
  Administered 2019-04-03: 21:00:00 1 mg via ORAL
  Filled 2019-04-03 (×3): qty 1

## 2019-04-03 NOTE — Progress Notes (Signed)
[facility] MD Progress Note  04/03/2019 11:30 AM Andrea Pugh  MRN:  [id number] Subjective: Patient is a 24 year old female admitted on 04/01/2019 with suicidal ideation, worsening depression, and subtle psychotic symptoms.  Objective: Patient is seen and examined.  Patient is a 24 year old female with the above-stated past psychiatric history who is seen in follow-up.  She is essentially unchanged from yesterday.  She continues to have some subtle paranoia as well as auditory hallucinations.  We had initially started her on Seroquel 50 mg nightly, and that was decreased to 25 secondary to oversedation as well as dizziness.  This a.m. she stated that she still remains dizzy, is significantly anxious especially around other people.  I think a lot of that anxiety is provoked by paranoia and auditory hallucinations.  She thinks that she hears negative things about her when she is around other people.  We discussed changing her medications today.  I am quite concerned that this may be first break psychosis versus psychotic depression.  Her anxiety about being around others is so severe that I think that what was considered social anxiety is really paranoia.  Her blood pressure stable, and she is mildly tachycardic with a rate of 105.  She is afebrile this morning.  She did complain of cold-like symptoms.  She did sleep 6.75 hours last night.  Her only complaint was dizziness with regard to possible medication side effects.  She denied suicidal ideation, but continued to endorse symptoms of auditory hallucinations, depression and paranoia.  Her TSH came back normal at 1.059.  Her hemoglobin A1c is borderline at 5.6.  Again, her drug screen was negative.  She admitted to very infrequent marijuana use in the past.  Principal Problem: <principal problem not specified> Diagnosis: Active Problems:   MDD (major depressive disorder), recurrent episode, severe (HCC)  Total Time spent with patient: 20 minutes  Past  Psychiatric History: See admission H&P  Past Medical History:  Past Medical History:  Diagnosis Date  . Anxiety   . Depression    History reviewed. No pertinent surgical history. Family History: History reviewed. No pertinent family history. Family Psychiatric  History: See admission H&P Social History:  Social History   Substance and Sexual Activity  Alcohol Use Yes   Comment: occasional     Social History   Substance and Sexual Activity  Drug Use Yes  . Types: Marijuana    Social History   Socioeconomic History  . Marital status: Single    Spouse name: Not on file  . Number of children: Not on file  . Years of education: Not on file  . Highest education level: Not on file  Occupational History  . Occupation: Consulting civil engineer  Tobacco Use  . Smoking status: Never Smoker  . Smokeless tobacco: Never Used  Substance and Sexual Activity  . Alcohol use: Yes    Comment: occasional  . Drug use: Yes    Types: Marijuana  . Sexual activity: Not Currently  Other Topics Concern  . Not on file  Social History Narrative   Pt currently lives in student housing at [employer].  She is currently contemplating continuing her education.  She also works at a [employer].  Pt currently followed by [facility].   Social Determinants of Health   Financial Resource Strain:   . Difficulty of Paying Living Expenses: Not on file  Food Insecurity:   . Worried About Programme researcher, broadcasting/film/video in the Last Year: Not on file  . Ran Out of Food  in the Last Year: Not on file  Transportation Needs:   . Lack of Transportation (Medical): Not on file  . Lack of Transportation (Non-Medical): Not on file  Physical Activity:   . Days of Exercise per Week: Not on file  . Minutes of Exercise per Session: Not on file  Stress:   . Feeling of Stress : Not on file  Social Connections:   . Frequency of Communication with Friends and Family: Not on file  . Frequency of Social Gatherings with Friends and Family: Not on  file  . Attends Religious Services: Not on file  . Active Member of Clubs or Organizations: Not on file  . Attends BankerClub or Organization Meetings: Not on file  . Marital Status: Not on file   Additional Social History:    Pain Medications: See MAR Prescriptions: See MAR Over the Counter: See MAR History of alcohol / drug use?: Yes Name of Substance 1: Marijuana 1 - Frequency: Episodic 1 - Duration: Ongoing   Name of Substance 3: Alcohol 3 - Frequency: Occassionally 3 - Duration: Ongoing              Sleep: Good  Appetite:  Fair  Current Medications: Current Facility-Administered Medications  Medication Dose Route Frequency Provider Last Rate Last Admin  . acetaminophen (TYLENOL) tablet 650 mg  650 mg Oral Q6H PRN Antonieta Pertlary, Sharrell Krawiec Lawson, MD      . alum & mag hydroxide-simeth (MAALOX/MYLANTA) 200-200-20 MG/5ML suspension 30 mL  30 mL Oral Q4H PRN Antonieta Pertlary, Zariana Strub Lawson, MD      . escitalopram (LEXAPRO) tablet 10 mg  10 mg Oral Daily Antonieta Pertlary, Kalena Mander Lawson, MD   10 mg at 04/03/19 [id number]  . hydrOXYzine (ATARAX/VISTARIL) tablet 25 mg  25 mg Oral TID PRN Antonieta Pertlary, Kaseem Vastine Lawson, MD      . magnesium hydroxide (MILK OF MAGNESIA) suspension 30 mL  30 mL Oral Daily PRN Antonieta Pertlary, Octavion Mollenkopf Lawson, MD      . risperiDONE (RISPERDAL) tablet 0.5 mg  0.5 mg Oral Daily Antonieta Pertlary, Telesha Deguzman Lawson, MD   0.5 mg at 04/03/19 1003  . risperiDONE (RISPERDAL) tablet 1 mg  1 mg Oral QHS Antonieta Pertlary, Maureen Duesing Lawson, MD      . traZODone (DESYREL) tablet 25 mg  25 mg Oral QHS PRN Antonieta Pertlary, Kazimierz Springborn Lawson, MD        Lab Results:  Results for orders placed or performed during the hospital encounter of 04/01/19 (from the past 48 hour(s))  Respiratory Panel by RT PCR (Flu A&B, Covid) - Urine, Clean Catch     Status: None   Collection Time: 04/01/19  2:20 PM   Specimen: Urine, Clean Catch  Result Value Ref Range   SARS Coronavirus 2 by RT PCR NEGATIVE NEGATIVE    Comment: (NOTE) SARS-CoV-2 target nucleic acids are NOT DETECTED. The SARS-CoV-2 RNA  is generally detectable in upper respiratoy specimens during the acute phase of infection. The lowest concentration of SARS-CoV-2 viral copies this assay can detect is 131 copies/mL. A negative result does not preclude SARS-Cov-2 infection and should not be used as the sole basis for treatment or other patient management decisions. A negative result may occur with  improper specimen collection/handling, submission of specimen other than nasopharyngeal swab, presence of viral mutation(s) within the areas targeted by this assay, and inadequate number of viral copies (<131 copies/mL). A negative result must be combined with clinical observations, patient history, and epidemiological information. The expected result is Negative. Fact Sheet for Patients:  https://www.moore.com/https://www.fda.gov/media/142436/download Fact  Sheet for Healthcare Providers:  https://www.young.biz/ This test is not yet ap proved or cleared by the [place] FDA and  has been authorized for detection and/or diagnosis of SARS-CoV-2 by FDA under an Emergency Use Authorization (EUA). This EUA will remain  in effect (meaning this test can be used) for the duration of the COVID-19 declaration under Section 564(b)(1) of the Act, 21 U.S.C. section 360bbb-3(b)(1), unless the authorization is terminated or revoked sooner.    Influenza A by PCR NEGATIVE NEGATIVE   Influenza B by PCR NEGATIVE NEGATIVE    Comment: (NOTE) The Xpert Xpress SARS-CoV-2/FLU/RSV assay is intended as an aid in  the diagnosis of influenza from Nasopharyngeal swab specimens and  should not be used as a sole basis for treatment. Nasal washings and  aspirates are unacceptable for Xpert Xpress SARS-CoV-2/FLU/RSV  testing. Fact Sheet for Patients: https://www.moore.com/ Fact Sheet for Healthcare Providers: https://www.young.biz/ This test is not yet approved or cleared by the [place] FDA and  has been  authorized for detection and/or diagnosis of SARS-CoV-2 by  FDA under an Emergency Use Authorization (EUA). This EUA will remain  in effect (meaning this test can be used) for the duration of the  Covid-19 declaration under Section 564(b)(1) of the Act, 21  U.S.C. section 360bbb-3(b)(1), unless the authorization is  terminated or revoked. Performed at [facility], [address] [place], [place] [address]   Comprehensive metabolic panel     Status: None   Collection Time: 04/02/19  6:14 AM  Result Value Ref Range   Sodium 140 135 - 145 mmol/L   Potassium 3.8 3.5 - 5.1 mmol/L   Chloride 103 98 - 111 mmol/L   CO2 28 22 - 32 mmol/L   Glucose, Bld 92 70 - 99 mg/dL    Comment: Glucose reference range applies only to samples taken after fasting for at least 8 hours.   BUN 10 6 - 20 mg/dL   Creatinine, Ser 0.35 0.44 - 1.00 mg/dL   Calcium 8.9 8.9 - 00.9 mg/dL   Total Protein 7.3 6.5 - 8.1 g/dL   Albumin 4.0 3.5 - 5.0 g/dL   AST 15 15 - 41 U/L   ALT 15 0 - 44 U/L   Alkaline Phosphatase 54 38 - 126 U/L   Total Bilirubin 0.8 0.3 - 1.2 mg/dL   GFR calc non Af Amer >60 >60 mL/min   GFR calc Af Amer >60 >60 mL/min   Anion gap 9 5 - 15    Comment: Performed at [facility], [address] [place], [place] [address]  CBC     Status: None   Collection Time: 04/02/19  6:14 AM  Result Value Ref Range   WBC 4.2 4.0 - 10.5 K/uL   RBC 4.21 3.87 - 5.11 MIL/uL   Hemoglobin 13.3 12.0 - 15.0 g/dL   HCT 99.3 71.6 - 96.7 %   MCV 93.1 80.0 - 100.0 fL   MCH 31.6 26.0 - 34.0 pg   MCHC 33.9 30.0 - 36.0 g/dL   RDW 89.3 81.0 - 17.5 %   Platelets 344 150 - 400 K/uL   nRBC 0.0 0.0 - 0.2 %    Comment: Performed at [facility], [address] [place], [place] [address]  Hemoglobin A1c     Status: None   Collection Time: 04/02/19  6:14 AM  Result Value Ref Range   Hgb A1c MFr Bld 5.6 4.8 - 5.6 %  Comment: (NOTE) Pre diabetes:           5.7%-6.4% Diabetes:              >6.4% Glycemic control for   <7.0% adults with diabetes    Mean Plasma Glucose 114.02 mg/dL    Comment: Performed at [facility], [address], [place], [place] [address]  Ethanol     Status: None   Collection Time: 04/02/19  6:14 AM  Result Value Ref Range   Alcohol, Ethyl (B) <10 <10 mg/dL    Comment: (NOTE) Lowest detectable limit for serum alcohol is 10 mg/dL. For medical purposes only. Performed at [facility], [address], [place], [place] [address]   Lipid panel     Status: Abnormal   Collection Time: 04/02/19  6:14 AM  Result Value Ref Range   Cholesterol 167 0 - 200 mg/dL   Triglycerides 44 <673 mg/dL   HDL 45 >41 mg/dL   Total CHOL/HDL Ratio 3.7 RATIO   VLDL 9 0 - 40 mg/dL   LDL Cholesterol 937 (H) 0 - 99 mg/dL    Comment:        Total Cholesterol/HDL:CHD Risk Coronary Heart Disease Risk Table                     Men   Women  1/2 Average Risk   3.4   3.3  Average Risk       5.0   4.4  2 X Average Risk   9.6   7.1  3 X Average Risk  23.4   11.0        Use the calculated Patient Ratio above and the CHD Risk Table to determine the patient's CHD Risk.        ATP III CLASSIFICATION (LDL):  <100     mg/dL   Optimal  902-409  mg/dL   Near or Above                    Optimal  130-159  mg/dL   Borderline  735-329  mg/dL   High  >924     mg/dL   Very High Performed at [facility], [address], [place], [place] [address]   TSH     Status: None   Collection Time: 04/02/19  6:14 AM  Result Value Ref Range   TSH 1.059 0.350 - 4.500 uIU/mL    Comment: Performed by a 3rd Generation assay with a functional sensitivity of <=0.01 uIU/mL. Performed at [facility], [address] [place], [place] [address]   Urinalysis, Routine w reflex microscopic     Status: Abnormal   Collection Time: 04/02/19  6:36 AM  Result Value Ref Range   Color, Urine YELLOW YELLOW    APPearance HAZY (A) CLEAR   Specific Gravity, Urine 1.025 1.005 - 1.030   pH 6.0 5.0 - 8.0   Glucose, UA NEGATIVE NEGATIVE mg/dL   Hgb urine dipstick NEGATIVE NEGATIVE   Bilirubin Urine NEGATIVE NEGATIVE   Ketones, ur NEGATIVE NEGATIVE mg/dL   Protein, ur NEGATIVE NEGATIVE mg/dL   Nitrite NEGATIVE NEGATIVE   Leukocytes,Ua NEGATIVE NEGATIVE    Comment: Performed at [facility], [address], [place], [place] [address]  Pregnancy, urine     Status: None   Collection Time: 04/02/19  6:36 AM  Result Value Ref Range   Preg Test, Ur NEGATIVE NEGATIVE    Comment:  THE SENSITIVITY OF THIS METHODOLOGY IS >20 mIU/mL. Performed at [facility], [address], [place], [place] [address]   Urine rapid drug screen (hosp performed)not at [facility]     Status: None   Collection Time: 04/02/19  6:36 AM  Result Value Ref Range   Opiates NONE DETECTED NONE DETECTED   Cocaine NONE DETECTED NONE DETECTED   Benzodiazepines NONE DETECTED NONE DETECTED   Amphetamines NONE DETECTED NONE DETECTED   Tetrahydrocannabinol NONE DETECTED NONE DETECTED   Barbiturates NONE DETECTED NONE DETECTED    Comment: (NOTE) DRUG SCREEN FOR MEDICAL PURPOSES ONLY.  IF CONFIRMATION IS NEEDED FOR ANY PURPOSE, NOTIFY LAB WITHIN 5 DAYS. LOWEST DETECTABLE LIMITS FOR URINE DRUG SCREEN Drug Class                     Cutoff (ng/mL) Amphetamine and metabolites    1000 Barbiturate and metabolites    200 Benzodiazepine                 200 Tricyclics and metabolites     300 Opiates and metabolites        300 Cocaine and metabolites        300 THC                            50 Performed at [facility], [address], [place], [place] [address]     Blood Alcohol level:  Lab Results  Component Value Date   ETH <10 04/02/2019   ETH <10 07/31/2017    Metabolic Disorder Labs: Lab Results  Component Value Date   HGBA1C 5.6 04/02/2019   MPG 114.02 04/02/2019    No results found for: PROLACTIN Lab Results  Component Value Date   CHOL 167 04/02/2019   TRIG 44 04/02/2019   HDL 45 04/02/2019   CHOLHDL 3.7 04/02/2019   VLDL 9 04/02/2019   LDLCALC 113 (H) 04/02/2019    Physical Findings: AIMS: Facial and Oral Movements Muscles of Facial Expression: None, normal Lips and Perioral Area: None, normal Jaw: None, normal Tongue: None, normal,Extremity Movements Upper (arms, wrists, hands, fingers): None, normal Lower (legs, knees, ankles, toes): None, normal, Trunk Movements Neck, shoulders, hips: None, normal, Overall Severity Severity of abnormal movements (highest score from questions above): None, normal Incapacitation due to abnormal movements: None, normal Patient's awareness of abnormal movements (rate only patient's report): No Awareness, Dental Status Current problems with teeth and/or dentures?: No Does patient usually wear dentures?: No  CIWA:  CIWA-Ar Total: 0 COWS:  COWS Total Score: 2  Musculoskeletal: Strength & Muscle Tone: within normal limits Gait & Station: normal Patient leans: N/A  Psychiatric Specialty Exam: Physical Exam  Nursing note and vitals reviewed. Constitutional: She is oriented to person, place, and time. She appears well-developed and well-nourished.  HENT:  Head: Normocephalic and atraumatic.  Respiratory: Effort normal.  Neurological: She is alert and oriented to person, place, and time.    Review of Systems  Blood pressure 108/71, pulse (!) 105, temperature 97.9 F (36.6 C), temperature source Oral, resp. rate 16, height 5\' 5"  (1.651 m), weight 88.5 kg, SpO2 98 %.Body mass index is 32.45 kg/m.  General Appearance: Disheveled  Eye Contact:  Fair  Speech:  Normal Rate  Volume:  Normal  Mood:  Anxious, Depressed and Dysphoric  Affect:  Constricted  Thought Process:  Coherent and Descriptions of Associations: Loose  Orientation:  Full (Time, Place,  and Person)  Thought Content:  Delusions,  Hallucinations: Auditory and Paranoid Ideation  Suicidal Thoughts:  No  Homicidal Thoughts:  No  Memory:  Immediate;   Fair Recent;   Fair Remote;   Fair  Judgement:  Intact  Insight:  Fair  Psychomotor Activity:  Increased  Concentration:  Concentration: Fair and Attention Span: Fair  Recall:  [employer] of Knowledge:  Good  Language:  Good  Akathisia:  Negative  Handed:  Right  AIMS (if indicated):     Assets:  Desire for Improvement Resilience  ADL's:  Intact  Cognition:  WNL  Sleep:  Number of Hours: 6.75     Treatment Plan Summary: Daily contact with patient to assess and evaluate symptoms and progress in treatment, Medication management and Plan : Patient is seen and examined.  Patient is a 24 year old female with the above-stated past psychiatric history who is seen in follow-up.   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  Her symptoms are essentially unchanged from admission.  It does not appear that she is going to tolerate the Seroquel.  On we will stop that today.  We will start Risperdal 0.5 mg p.o. daily and 1 mg p.o. nightly.  We will continue the Lexapro at 10 mg p.o. daily as well as hydroxyzine 25 mg p.o. 3 times daily as needed.  We will also continue trazodone 25 mg p.o. nightly as needed insomnia.  1.  Continue Lexapro 10 mg p.o. daily for anxiety and depression. 2.  Continue hydroxyzine 25 mg p.o. 3 times daily as needed anxiety. 3.  Stop Seroquel. 4.  Start Risperdal 0.5 mg p.o. daily and 1 mg p.o. nightly for psychosis. 5.  Add Lovaza 1 g p.o. twice daily for neuro protection. 6.  Continue trazodone 25 mg p.o. nightly as needed insomnia. 7.  Disposition planning-in progress.  Sharma Covert, MD 04/03/2019, 11:30 AM

## 2019-04-03 NOTE — Tx Team (Signed)
Interdisciplinary Treatment and Diagnostic Plan Update  04/03/2019 Time of Session: 9:15a Kealy Lewter MRN: [id number]  Principal Diagnosis: <principal problem not specified>  Secondary Diagnoses: Active Problems:   MDD (major depressive disorder), recurrent episode, severe (HCC)   Current Medications:  Current Facility-Administered Medications  Medication Dose Route Frequency Provider Last Rate Last Admin  . acetaminophen (TYLENOL) tablet 650 mg  650 mg Oral Q6H PRN Sharma Covert, MD      . alum & mag hydroxide-simeth (MAALOX/MYLANTA) 200-200-20 MG/5ML suspension 30 mL  30 mL Oral Q4H PRN Sharma Covert, MD      . escitalopram (LEXAPRO) tablet 10 mg  10 mg Oral Daily Sharma Covert, MD   10 mg at 04/03/19 4132  . hydrOXYzine (ATARAX/VISTARIL) tablet 25 mg  25 mg Oral TID PRN Sharma Covert, MD      . magnesium hydroxide (MILK OF MAGNESIA) suspension 30 mL  30 mL Oral Daily PRN Sharma Covert, MD      . risperiDONE (RISPERDAL) tablet 0.5 mg  0.5 mg Oral Daily Sharma Covert, MD      . risperiDONE (RISPERDAL) tablet 1 mg  1 mg Oral QHS Sharma Covert, MD      . traZODone (DESYREL) tablet 25 mg  25 mg Oral QHS PRN Sharma Covert, MD       PTA Medications: Medications Prior to Admission  Medication Sig Dispense Refill Last Dose  . escitalopram (LEXAPRO) 10 MG tablet Take 10 mg by mouth daily.     Marland Kitchen FLUoxetine (PROZAC) 20 MG capsule Take 1 capsule (20 mg total) by mouth daily. (Patient not taking: Reported on 04/02/2019) 30 capsule 0 Not Taking at Unknown time  . hydrOXYzine (ATARAX/VISTARIL) 25 MG tablet Take 1 tablet (25 mg total) by mouth every 6 (six) hours as needed for anxiety. (Patient not taking: Reported on 04/02/2019) 30 tablet 0 Not Taking at Unknown time  . metroNIDAZOLE (FLAGYL) 500 MG tablet Take 1 tablet (500 mg total) by mouth 2 (two) times daily. (Patient not taking: Reported on 04/02/2019) 14 tablet 0 Completed Course at Unknown time  .  naproxen (NAPROSYN) 500 MG tablet Take 1 tablet (500 mg total) by mouth 2 (two) times daily. (Patient not taking: Reported on 04/02/2019) 14 tablet 0 Completed Course at Unknown time  . propranolol (INDERAL) 10 MG tablet Take 1 tablet (10 mg total) by mouth 2 (two) times daily. (Patient not taking: Reported on 04/02/2019) 60 tablet 0 Not Taking at Unknown time  . traZODone (DESYREL) 50 MG tablet Take 1 tablet (50 mg total) by mouth at bedtime as needed for sleep. (Patient not taking: Reported on 04/02/2019) 30 tablet 0 Not Taking at Unknown time    Patient Stressors: Financial difficulties Medication change or noncompliance  Patient Strengths: Average or above average intelligence Capable of independent living Work skills  Treatment Modalities: Medication Management, Group therapy, Case management,  1 to 1 session with clinician, Psychoeducation, Recreational therapy.   Physician Treatment Plan for Primary Diagnosis: <principal problem not specified> Long Term Goal(s):     Short Term Goals:    Medication Management: Evaluate patient's response, side effects, and tolerance of medication regimen.  Therapeutic Interventions: 1 to 1 sessions, Unit Group sessions and Medication administration.  Evaluation of Outcomes: Not Met  Physician Treatment Plan for Secondary Diagnosis: Active Problems:   MDD (major depressive disorder), recurrent episode, severe ([place])  Long Term Goal(s):     Short Term Goals:  Medication Management: Evaluate patient's response, side effects, and tolerance of medication regimen.  Therapeutic Interventions: 1 to 1 sessions, Unit Group sessions and Medication administration.  Evaluation of Outcomes: Not Met   RN Treatment Plan for Primary Diagnosis: <principal problem not specified> Long Term Goal(s): Knowledge of disease and therapeutic regimen to maintain health will improve  Short Term Goals: Ability to verbalize frustration and anger appropriately  will improve, Ability to verbalize feelings will improve, Ability to disclose and discuss suicidal ideas, Ability to identify and develop effective coping behaviors will improve and Compliance with prescribed medications will improve  Medication Management: RN will administer medications as ordered by provider, will assess and evaluate patient's response and provide education to patient for prescribed medication. RN will report any adverse and/or side effects to prescribing provider.  Therapeutic Interventions: 1 on 1 counseling sessions, Psychoeducation, Medication administration, Evaluate responses to treatment, Monitor vital signs and CBGs as ordered, Perform/monitor CIWA, COWS, AIMS and Fall Risk screenings as ordered, Perform wound care treatments as ordered.  Evaluation of Outcomes: Not Met   LCSW Treatment Plan for Primary Diagnosis: <principal problem not specified> Long Term Goal(s): Safe transition to appropriate next level of care at discharge, Engage patient in therapeutic group addressing interpersonal concerns.  Short Term Goals: Engage patient in aftercare planning with referrals and resources  Therapeutic Interventions: Assess for all discharge needs, 1 to 1 time with Social worker, Explore available resources and support systems, Assess for adequacy in community support network, Educate family and significant other(s) on suicide prevention, Complete Psychosocial Assessment, Interpersonal group therapy.  Evaluation of Outcomes: Adequate for Discharge   Progress in Treatment: Attending groups: Yes. Participating in groups: Yes. Taking medication as prescribed: Yes. Toleration medication: Yes. Family/Significant other contact made: No, will contact:  the patient's mother, once the patient can locate her mother's number Patient understands diagnosis: Yes. Discussing patient identified problems/goals with staff: Yes. Medical problems stabilized or resolved: Yes. Denies  suicidal/homicidal ideation: Yes. Issues/concerns per patient self-inventory: No. Other:   New problem(s) identified: None   New Short Term/Long Term Goal(s): medication stabilization, elimination of SI thoughts, development of comprehensive mental wellness plan.    Patient Goals: "Trying to control my emotions. I tend to "over think", that causes me to become confuse with my memories. It becomes very frustrating when I hear that negative voice and no one else can hear it"     Discharge Plan or Barriers: Patient plans to return to her apartment with roommates. She will follow up with [facility] for outpatient medication management and therapy services. Patient was also provided resources for [place] for additional services.   Reason for Continuation of Hospitalization: Anxiety Depression Hallucinations Medication stabilization  Estimated Length of Stay: 3-5 days   Attendees: Patient: Andrea Pugh 04/03/2019 9:36 AM  Physician: Dr. Myles Lipps, MD 04/03/2019 9:36 AM  Nursing:  04/03/2019 9:36 AM  RN Care Manager: 04/03/2019 9:36 AM  Social Worker: Radonna Ricker, LCSW 04/03/2019 9:36 AM  Recreational Therapist:  04/03/2019 9:36 AM  Other:  04/03/2019 9:36 AM  Other:  04/03/2019 9:36 AM  Other: 04/03/2019 9:36 AM    Scribe for Treatment Team: Marylee Floras, [place] 04/03/2019 9:36 AM

## 2019-04-03 NOTE — Plan of Care (Signed)
Progress note  D: pt found in bed; compliant with medication administration. Pt has complaints of insomnia and becoming anxious with thinking of past situations that overwhelm them. Pt denies any physical symptoms or pain. Pt states they feel unsteady. Pt encouraged to continue to push fluids. Pt denies si/hi/ah/vh and verbally agrees to approach staff if these become apparent or before harming themself/others while at [facility].  A: Pt provided support and encouragement. Pt given medication per protocol and standing orders. Q28m safety checks implemented and continued.  R: Pt safe on the unit. Will continue to monitor.  Pt progressing in the following metrics  Problem: Education: Goal: Knowledge of [place] [facility] Education information/materials will improve Outcome: Progressing Goal: Emotional status will improve Outcome: Progressing Goal: Mental status will improve Outcome: Progressing Goal: Verbalization of understanding the information provided will improve Outcome: Progressing

## 2019-04-03 NOTE — Progress Notes (Signed)
   04/02/19 2015  Psych Admission Type (Psych Patients Only)  Admission Status Voluntary  Psychosocial Assessment  Patient Complaints None  Eye Contact Fair  Facial Expression Flat  Affect Appropriate to circumstance  Speech Logical/coherent  Interaction Minimal  Motor Activity Other (Comment) (WNL)  Appearance/Hygiene Unremarkable  Behavior Characteristics Cooperative;Appropriate to situation  Mood Depressed;Anxious  Aggressive Behavior  Effect No apparent injury  Thought Process  Coherency WDL  Content WDL  Delusions None reported or observed  Perception WDL  Hallucination None reported or observed  Judgment WDL  Confusion None  Danger to Self  Current suicidal ideation? Denies  Danger to Others  Danger to Others None reported or observed

## 2019-04-03 NOTE — Progress Notes (Signed)
   04/03/19 2155  Psych Admission Type (Psych Patients Only)  Admission Status Voluntary  Psychosocial Assessment  Patient Complaints Anxiety  Eye Contact Fair  Facial Expression Anxious;Pensive  Affect Anxious;Appropriate to circumstance  Speech Logical/coherent  Interaction Assertive  Motor Activity Other (Comment) (WNL)  Appearance/Hygiene Unremarkable  Behavior Characteristics Cooperative;Anxious  Mood Depressed;Anxious  Aggressive Behavior  Effect No apparent injury  Thought Process  Coherency WDL  Content Preoccupation  Delusions None reported or observed  Perception WDL  Hallucination None reported or observed  Judgment WDL  Confusion None  Danger to Self  Current suicidal ideation? Denies  Danger to Others  Danger to Others None reported or observed   Pt states that she is feeling "less panicky" today. Her anxiety level has greatly reduced.

## 2019-04-03 NOTE — Progress Notes (Signed)
Recreation Therapy Notes  Date:  2.26.21 Time: 0930 Location: 300 Hall Group Room  Group Topic: Stress Management  Goal Area(s) Addresses:  Patient will identify positive stress management techniques. Patient will identify benefits of using stress management post d/c.  Intervention: Stress Management  Activity : Meditation.  LRT played a meditation that focused on taking on the characteristics of a mountain.  The meditation encouraged patients stand tall and remain steadfast and positive despite what may come their way.  Education:  Stress Management, Discharge Planning.   Education Outcome: Acknowledges Education  Clinical Observations/Feedback:  Pt did not attend group session.    Caroll Rancher, LRT/CTRS         Lillia Abed, Nakyra Bourn A 04/03/2019 11:22 AM

## 2019-04-03 NOTE — [facility] Group Notes (Signed)
LCSW Group Therapy Note 04/03/2019 2:07 PM  Type of Therapy and Topic: Group Therapy: Overcoming Obstacles  Participation Level: Did Not Attend  Description of Group:  In this group patients will be encouraged to explore what they see as obstacles to their own wellness and recovery. They will be guided to discuss their thoughts, feelings, and behaviors related to these obstacles. The group will process together ways to cope with barriers, with attention given to specific choices patients can make. Each patient will be challenged to identify changes they are motivated to make in order to overcome their obstacles. This group will be process-oriented, with patients participating in exploration of their own experiences as well as giving and receiving support and challenge from other group members.  Therapeutic Goals: 1. Patient will identify personal and current obstacles as they relate to admission. 2. Patient will identify barriers that currently interfere with their wellness or overcoming obstacles.  3. Patient will identify feelings, thought process and behaviors related to these barriers. 4. Patient will identify two changes they are willing to make to overcome these obstacles:   Summary of Patient Progress   Invited, chose not to attend.    Therapeutic Modalities:  Cognitive Behavioral Therapy Solution Focused Therapy Motivational Interviewing Relapse Prevention Therapy   Nichlos Kunzler LCSWA Clinical Social Worker   

## 2019-04-04 DIAGNOSIS — F333 Major depressive disorder, recurrent, severe with psychotic symptoms: Principal | ICD-10-CM

## 2019-04-04 MED ORDER — RISPERIDONE 1 MG PO TABS
1.5000 mg | ORAL_TABLET | Freq: Every day | ORAL | Status: DC
  Administered 2019-04-04 – 2019-04-05 (×2): 1.5 mg via ORAL
  Filled 2019-04-04 (×3): qty 1

## 2019-04-04 MED ORDER — MECLIZINE HCL 12.5 MG PO TABS
12.5000 mg | ORAL_TABLET | Freq: Two times a day (BID) | ORAL | Status: DC
  Administered 2019-04-04 – 2019-04-06 (×4): 12.5 mg via ORAL
  Filled 2019-04-04 (×6): qty 1

## 2019-04-04 NOTE — Progress Notes (Signed)
D: Presents with flat affect and depressed mood.  Denied having SI/HI/AVH.  Stated that she felt groggy this morning. A: RN provided emotional support and reassurance.  RN administered medications as prescribed.  Monitored pt's safety and assessed for needs/concerns. R:Pt stated that she felt dizzy after taking morning medicines.  RN notified MD re: pt's report of dizziness.  RN received order for Anitvert for dizziness.  Pt is resting comfortably at this time.  RN will continue to provide support and assist pt as needed.

## 2019-04-04 NOTE — Progress Notes (Signed)
[facility] MD Progress Note  04/04/2019 12:13 PM Andrea Pugh  MRN:  [id number]  Subjective: Guneet reports, "My mind is fuzzy still. The fuzziness started prior to my hospitalization. It is not clearing yet. But, I feel calm, nod depressed. I'm not having any more panic attacks. When I try to remember stuff, my head will start hurting".  Objective: Patient is a 24 year old female with the above-stated past psychiatric history (suicidal ideation, worsening depression, and subtle psychotic symptoms). She is seen for follow-up care.  She is essentially unchanged from yesterday. She is lying down in her bed.  She says she feels calm, but denies any symptoms of depression or anxiety. She says her panic attacks has improved as she is no longer having those. However, she is complaining of her mind feeling fuzzy. She says this is not improving as it was going on prior to her admission. She adds that if she tries to remember some events, her head will hurt. She denies any SIHI, AVH,& paranoia.  She is taking & tolerating her treatment regimen. Denies any adverse effects or reactions. She says she attends some group sessions sometimes. She is encouraged to come out of her room & participate in the ongoing group sessions. Her vital signs are stable today.  Per previous notes: We had initially started this patient on Seroquel 50 mg nightly, and that was decreased to 25 secondary to oversedation as well as dizziness. And yesterday morning during the follow-up care evaluation, she stated that she still remains dizzy, is significantly anxious especially around other people.  I think a lot of that anxiety is provoked by paranoia and auditory hallucinations.  She thinks that she hears negative things about her when she is around other people.  We discussed changing her medications today.  I am quite concerned that this may be first break psychosis versus psychotic depression.  Her anxiety about being around others is so  severe that I think that what was considered social anxiety is really paranoia.  Her blood pressure stable, and she is mildly tachycardic with a rate of 105.  She is afebrile this morning.  She did complain of cold-like symptoms.  She did sleep 6.75 hours last night.  Her only complaint was dizziness with regard to possible medication side effects.  She denied suicidal ideation, but continued to endorse symptoms of auditory hallucinations, depression and paranoia.  Her TSH came back normal at 1.059.  Her hemoglobin A1c is borderline at 5.6.  Again, her drug screen was negative.  She admitted to very infrequent marijuana use in the past.  Principal Problem: <principal problem not specified>  Diagnosis: Active Problems:   MDD (major depressive disorder), recurrent episode, severe (HCC)   Psychosis (HCC)  Total Time spent with patient: 15 minutes  Past Psychiatric History: See admission H&P  Past Medical History:  Past Medical History:  Diagnosis Date  . Anxiety   . Depression    History reviewed. No pertinent surgical history.  Family History: History reviewed. No pertinent family history.  Family Psychiatric  History: See admission H&P  Social History:  Social History   Substance and Sexual Activity  Alcohol Use Yes   Comment: occasional     Social History   Substance and Sexual Activity  Drug Use Yes  . Types: Marijuana    Social History   Socioeconomic History  . Marital status: Single    Spouse name: Not on file  . Number of children: Not on file  . Years of  education: Not on file  . Highest education level: Not on file  Occupational History  . Occupation: Consulting civil engineer  Tobacco Use  . Smoking status: Never Smoker  . Smokeless tobacco: Never Used  Substance and Sexual Activity  . Alcohol use: Yes    Comment: occasional  . Drug use: Yes    Types: Marijuana  . Sexual activity: Not Currently  Other Topics Concern  . Not on file  Social History Narrative   Pt  currently lives in student housing at [employer].  She is currently contemplating continuing her education.  She also works at a [employer].  Pt currently followed by [facility].   Social Determinants of Health   Financial Resource Strain:   . Difficulty of Paying Living Expenses: Not on file  Food Insecurity:   . Worried About Programme researcher, broadcasting/film/video in the Last Year: Not on file  . Ran Out of Food in the Last Year: Not on file  Transportation Needs:   . Lack of Transportation (Medical): Not on file  . Lack of Transportation (Non-Medical): Not on file  Physical Activity:   . Days of Exercise per Week: Not on file  . Minutes of Exercise per Session: Not on file  Stress:   . Feeling of Stress : Not on file  Social Connections:   . Frequency of Communication with Friends and Family: Not on file  . Frequency of Social Gatherings with Friends and Family: Not on file  . Attends Religious Services: Not on file  . Active Member of Clubs or Organizations: Not on file  . Attends Banker Meetings: Not on file  . Marital Status: Not on file   Additional Social History:    Pain Medications: See MAR Prescriptions: See MAR Over the Counter: See MAR History of alcohol / drug use?: Yes Name of Substance 1: Marijuana 1 - Frequency: Episodic 1 - Duration: Ongoing   Name of Substance 3: Alcohol 3 - Frequency: Occassionally 3 - Duration: Ongoing  Sleep: Good  Appetite:  Fair  Current Medications: Current Facility-Administered Medications  Medication Dose Route Frequency Provider Last Rate Last Admin  . acetaminophen (TYLENOL) tablet 650 mg  650 mg Oral Q6H PRN Antonieta Pert, MD      . alum & mag hydroxide-simeth (MAALOX/MYLANTA) 200-200-20 MG/5ML suspension 30 mL  30 mL Oral Q4H PRN Antonieta Pert, MD      . escitalopram (LEXAPRO) tablet 10 mg  10 mg Oral Daily Antonieta Pert, MD   10 mg at 04/04/19 0854  . hydrOXYzine (ATARAX/VISTARIL) tablet 25 mg  25 mg Oral TID  PRN Antonieta Pert, MD      . magnesium hydroxide (MILK OF MAGNESIA) suspension 30 mL  30 mL Oral Daily PRN Antonieta Pert, MD      . risperiDONE (RISPERDAL) tablet 0.5 mg  0.5 mg Oral Daily Antonieta Pert, MD   0.5 mg at 04/04/19 0854  . risperiDONE (RISPERDAL) tablet 1 mg  1 mg Oral QHS Antonieta Pert, MD   1 mg at 04/03/19 2117  . traZODone (DESYREL) tablet 25 mg  25 mg Oral QHS PRN Antonieta Pert, MD       Lab Results:  No results found for this or any previous visit (from the past 48 hour(s)).  Blood Alcohol level:  Lab Results  Component Value Date   [facility] <10 04/02/2019   ETH <10 07/31/2017   Metabolic Disorder Labs: Lab Results  Component Value  Date   HGBA1C 5.6 04/02/2019   MPG 114.02 04/02/2019   No results found for: PROLACTIN Lab Results  Component Value Date   CHOL 167 04/02/2019   TRIG 44 04/02/2019   HDL 45 04/02/2019   CHOLHDL 3.7 04/02/2019   VLDL 9 04/02/2019   LDLCALC 113 (H) 04/02/2019   Physical Findings: AIMS: Facial and Oral Movements Muscles of Facial Expression: None, normal Lips and Perioral Area: None, normal Jaw: None, normal Tongue: None, normal,Extremity Movements Upper (arms, wrists, hands, fingers): None, normal Lower (legs, knees, ankles, toes): None, normal, Trunk Movements Neck, shoulders, hips: None, normal, Overall Severity Severity of abnormal movements (highest score from questions above): None, normal Incapacitation due to abnormal movements: None, normal Patient's awareness of abnormal movements (rate only patient's report): No Awareness, Dental Status Current problems with teeth and/or dentures?: No Does patient usually wear dentures?: No  CIWA:  CIWA-Ar Total: 0 COWS:  COWS Total Score: 2  Musculoskeletal: Strength & Muscle Tone: within normal limits Gait & Station: normal Patient leans: N/A  Psychiatric Specialty Exam: Physical Exam  Nursing note and vitals reviewed. Constitutional: She is oriented  to person, place, and time. She appears well-developed and well-nourished.  HENT:  Head: Normocephalic and atraumatic.  Respiratory: Effort normal.  Neurological: She is alert and oriented to person, place, and time.  Skin: Skin is warm and dry.    Review of Systems  Constitutional: Negative for chills, diaphoresis and fever.  HENT: Negative for congestion, rhinorrhea, sneezing and sore throat.   Respiratory: Negative for cough, shortness of breath and wheezing.   Cardiovascular: Negative for chest pain and palpitations.  Gastrointestinal: Negative for diarrhea, nausea and vomiting.  Genitourinary: Negative for difficulty urinating.  Musculoskeletal: Negative for myalgias.  Allergic/Immunologic: Negative for environmental allergies and food allergies.  Neurological: Negative for dizziness, seizures, syncope, numbness and headaches.  Psychiatric/Behavioral: Positive for dysphoric mood ("Improving"). Negative for agitation, behavioral problems, confusion (My mind feels fuzzy), decreased concentration, hallucinations, self-injury, sleep disturbance and suicidal ideas. The patient is not nervous/anxious and is not hyperactive.     Blood pressure 124/77, pulse 96, temperature 98 F (36.7 C), resp. rate 16, height 5\' 5"  (1.651 m), weight 88.5 kg, SpO2 98 %.Body mass index is 32.45 kg/m.  General Appearance: Casual  Eye Contact:  Fair  Speech:  Normal Rate  Volume:  Normal  Mood:  "I feel calm not depressed. My mood is good"  Affect:  Constricted  Thought Process:  Coherent and Descriptions of Associations: Loose  Orientation:  Full (Time, Place, and Person)  Thought Content:  Delusions, but denies any AVH.  Suicidal Thoughts:  Denies  Homicidal Thoughts:  Denies  Memory:  Immediate;   Fair Recent;   Fair Remote;   Fair  Judgement:  Intact  Insight:  Fair  Psychomotor Activity:  Increased  Concentration:  Concentration: Fair and Attention Span: Fair  Recall:  of  Knowledge:  Good  Language:  Good  Akathisia:  Negative  Handed:  Right  AIMS (if indicated):     Assets:  Desire for Improvement Resilience  ADL's:  Intact  Cognition:  WNL  Sleep:  Number of Hours: 6.75   Treatment Plan Summary: Daily contact with patient to assess and evaluate symptoms and progress in treatment, Medication management and Plan : Patient is seen and examined.  Patient is a 24 year old female with the above-stated past psychiatric history who is seen in follow-up.   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  Her symptoms are essentially unchanged from admission. She says her mind feel fuzzy. Had started on Risperdal 0.5 mg p.o. daily and 1 mg p.o. nightly.  We will continue the Lexapro at 10 mg p.o. daily as well as hydroxyzine 25 mg p.o. 3 times daily as needed.  We will also continue trazodone 25 mg p.o. nightly as needed insomnia.  1. Depression.        - Continue Lexapro 10 mg p.o. daily for anxiety and depression. 2.  Anxiety         - Continue hydroxyzine 25 mg p.o. 3 times daily as needed anxiety.  3.  Stopped Seroquel.  4.  Mood control.        - Continue Risperdal 0.5 mg p.o. daily and 1 mg p.o. nightly.  5. Neuro protection.       - Lovaza 1 g p.o. twice daily for   6.  Insomnia       - Continue trazodone 25 mg p.o. nightly as needed insomnia.  7.   - Disposition planning-in progress.  8.   - Encourage group participation.  Lindell Spar, NP, PMHNP, FNP-BC. 04/04/2019, 12:13 PMPatient ID: Andrea Pugh, female   DOB: Mar 06, 1995, 24 y.o.   MRN: [id number]

## 2019-04-04 NOTE — [facility] Group Notes (Signed)
LCSW Group Therapy Note  11/29/2018   10:00-11:00am   Type of Therapy and Topic:  Group Therapy: Anger Analysis Using CBT  Participation Level:  Did Not Attend   Description of Group:   In this group, patients learned how to recognize the physical, cognitive, emotional, and behavioral responses they have to anger-provoking situations.  They identified a recent time they became angry and how they reacted.  They identified the thoughts they had at the time they last were angry, and how this influenced their subsequent actions.  The group then explored how Cognitive Behavioral analysis can help change outcomes.  Therapeutic Goals: Patients will remember his/her last incident of anger and the surrounding circumstances Patients will identify how they felt emotionally and physically, what their thoughts were at the time, and what actions they took Patients will explore possible changes in their feelings and actions if their thoughts had been analyzed and actually found to be inaccurate or unhelpful.  Summary of Patient Progress:  The patient was invited to group, chose not to attend.  Therapeutic Modalities:   Cognitive Behavioral Therapy  [name], MSW, LCSW [phone]

## 2019-04-04 NOTE — Progress Notes (Signed)
[facility] Group Notes:  (Nursing/MHT/Case Management/Adjunct)  Date:  04/04/2019  Time:  2030 Type of Therapy:  wrap up group  Participation Level:  Active  Participation Quality:  Appropriate, Attentive, Sharing and Supportive  Affect:  Resistant  Cognitive:  Appropriate  Insight:  Improving  Engagement in Group:  Engaged  Modes of Intervention:  Clarification, Education and Support  Summary of Progress/Problems: Positive thinking and positive change were discussed.   Marcille Buffy 04/04/2019, 9:40 PM

## 2019-04-05 NOTE — [facility] Group Notes (Signed)
Adult Psychoeducational Group Note  Date:  04/05/2019 Time:  10:56 AM  Group Topic/Focus:  Progressive Relaxation:  Going throught the physical muscles groups and tightening and relaxing. Also teach everyone to breathe deeply.  Participation Level:  Pt was sick and came into the group at the very end of the session. When she came in however, she did participate and the following assessment reflects that:  Participation Quality:  Appropriate  Affect:  Flat  Cognitive:  Oriented  Insight: Good  Engagement in Group:  Engaged  Modes of Intervention:  Activity, Education and Support  Additional Comments:  Pt participated once she came into the group  Vira Blanco A 04/05/2019, 10:56 AM

## 2019-04-05 NOTE — [facility] Group Notes (Signed)
[facility] LCSW Group Therapy Note  04/05/2019    Type of Therapy and Topic:  Group Therapy:  Adding Supports Including Yourself  Participation Level:  Active   Description of Group:   Patients in this group were introduced to the concept that additional supports including self-support are an essential part of recovery.  Patients listed what supports they believe they need to add to their lives to achieve their goals at discharge, and they listed such things as therapist, family, doctor, support groups, 12-step groups and service animals.   A song entitled "My Own Hero" was played and a group discussion ensued in which patients stated they could relate to the song and it inspired them to realize they have be willing to help themselves in order to succeed, because other people cannot achieve sobriety or stability for them.  "Fight Megan Salon" was played, then "I Am Enough" to encourage patients.  They discussed the impact on them and how they must remain convinced that their lives are worth the effort it takes to become sober and/or stable.  Therapeutic Goals: 1)  demonstrate the importance of being a key part of one's own support system 2)  discuss various available supports 3)  encourage patient to use music as part of their self-support and focus on goals 4)  elicit ideas from patients about supports that need to be added   Summary of Patient Progress:  The patient expressed that she has 2 friends who are healthy suports for her and an older sister who is an unhealthy support because she "does not understand."  Her mother vacillates between healthy and unhealthy for her, also based on her lack of understanding.  We discussed the difference between "not understanding" and "not wanting to understand" as well as what can be done to help family members who don't understand but do want to.   Therapeutic Modalities:   Motivational Interviewing Activity  Carloyn Jaeger Grossman-Orr  8:42 AM

## 2019-04-05 NOTE — [facility] Suicide Risk Assessment (Signed)
[facility] INPATIENT:  Family/Significant Other Suicide Prevention Education  Suicide Prevention Education:  Contact Attempts: mother, [name] [phone], (name of family member/significant other) has been identified by the patient as the family member/significant other with whom the patient will be residing, and identified as the person(s) who will aid the patient in the event of a mental health crisis.  With written consent from the patient, two attempts were made to provide suicide prevention education, prior to and/or following the patient's discharge.  We were unsuccessful in providing suicide prevention education.  A suicide education pamphlet was given to the patient to share with family/significant other.  Date and time of first attempt:  04/05/2019  /  4:10pm  - could not leave VM Date and time of second attempt:  04/05/2019  /  5:00pm - could not leave VM  [name] 04/05/2019, 4:13 PM

## 2019-04-05 NOTE — Progress Notes (Addendum)
[facility] MD Progress Note  04/05/2019 12:53 PM Andrea Pugh  MRN:  [id number]  Subjective: [name] reports, "I'm doing a lot better today. Finally, I have decided to start pulling my own weight. I slept well last night. My mood is good. I have learned a thing or two for being here. 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 I would really want to use my experience to help other people".  Objective: Patient is a 24 year old female with the above-stated past psychiatric history (suicidal ideation, worsening depression, and subtle psychotic symptoms). She is seen for follow-up care. Andrea Pugh is seen, chart reviewed. The chart findings discussed with the treatment team. She presents alert, oriented & aware of situation. She is out of bed today, visible on the unit, attending group sessions. She is essentially singing a new tune today. She presents with a bright & reactive affect. She says she is not having any symptoms of depression or anxiety.  She says she has made up her mind to start pulling her own weight. She denies any issues or concerns today. She says her hospitalization here at [facility] has helped her & open up a new door for her. She says after discharge, she will go back to school to learn psychology & become a psychologist. She says she wants to learn how the mind works & use her experience here to help others. She says the fuzziness she was feeling may be related to her contact lenses. She says she has been wearing them for a while. She denies any SIHI, AVH, delusional thoughts or paranoia. She does not appear to be responding to any internal stimuli. Andrea Pugh says she will be ready for discharge by Tuesday 04-07-19. She is agreement to continue current plan of care as already in progress.  Per previous notes: We had initially started this patient on Seroquel 50 mg nightly, and that was decreased to 25 secondary to oversedation as well as dizziness. And yesterday morning during the  follow-up care evaluation, she stated that she still remains dizzy, is significantly anxious especially around other people.  I think a lot of that anxiety is provoked by paranoia and auditory hallucinations.  She thinks that she hears negative things about her when she is around other people.  We discussed changing her medications today.  I am quite concerned that this may be first break psychosis versus psychotic depression.  Her anxiety about being around others is so severe that I think that what was considered social anxiety is really paranoia.  Her blood pressure stable, and she is mildly tachycardic with a rate of 105.  She is afebrile this morning.  She did complain of cold-like symptoms.  She did sleep 6.75 hours last night.  Her only complaint was dizziness with regard to possible medication side effects.  She denied suicidal ideation, but continued to endorse symptoms of auditory hallucinations, depression and paranoia.  Her TSH came back normal at 1.059.  Her hemoglobin A1c is borderline at 5.6.  Again, her drug screen was negative.  She admitted to very infrequent marijuana use in the past.  Principal Problem: <principal problem not specified>  Diagnosis: Active Problems:   MDD (major depressive disorder), recurrent episode, severe (HCC)   Psychosis (HCC)  Total Time spent with patient: 15 minutes  Past Psychiatric History: See admission H&P  Past Medical History:  Past Medical History:  Diagnosis Date  . Anxiety   . Depression    History reviewed. No pertinent surgical  history.  Family History: History reviewed. No pertinent family history.  Family Psychiatric  History: See admission H&P  Social History:  Social History   Substance and Sexual Activity  Alcohol Use Yes   Comment: occasional     Social History   Substance and Sexual Activity  Drug Use Yes  . Types: Marijuana    Social History   Socioeconomic History  . Marital status: Single    Spouse name: Not on  file  . Number of children: Not on file  . Years of education: Not on file  . Highest education level: Not on file  Occupational History  . Occupation: Consulting civil engineer  Tobacco Use  . Smoking status: Never Smoker  . Smokeless tobacco: Never Used  Substance and Sexual Activity  . Alcohol use: Yes    Comment: occasional  . Drug use: Yes    Types: Marijuana  . Sexual activity: Not Currently  Other Topics Concern  . Not on file  Social History Narrative   Pt currently lives in student housing at [employer].  She is currently contemplating continuing her education.  She also works at a [employer].  Pt currently followed by [facility].   Social Determinants of Health   Financial Resource Strain:   . Difficulty of Paying Living Expenses: Not on file  Food Insecurity:   . Worried About Programme researcher, broadcasting/film/video in the Last Year: Not on file  . Ran Out of Food in the Last Year: Not on file  Transportation Needs:   . Lack of Transportation (Medical): Not on file  . Lack of Transportation (Non-Medical): Not on file  Physical Activity:   . Days of Exercise per Week: Not on file  . Minutes of Exercise per Session: Not on file  Stress:   . Feeling of Stress : Not on file  Social Connections:   . Frequency of Communication with Friends and Family: Not on file  . Frequency of Social Gatherings with Friends and Family: Not on file  . Attends Religious Services: Not on file  . Active Member of Clubs or Organizations: Not on file  . Attends Banker Meetings: Not on file  . Marital Status: Not on file   Additional Social History:    Pain Medications: See MAR Prescriptions: See MAR Over the Counter: See MAR History of alcohol / drug use?: Yes Name of Substance 1: Marijuana 1 - Frequency: Episodic 1 - Duration: Ongoing   Name of Substance 3: Alcohol 3 - Frequency: Occassionally 3 - Duration: Ongoing  Sleep: Good  Appetite:  Fair  Current Medications: Current  Facility-Administered Medications  Medication Dose Route Frequency Provider Last Rate Last Admin  . acetaminophen (TYLENOL) tablet 650 mg  650 mg Oral Q6H PRN Andrea Pert, MD      . alum & mag hydroxide-simeth (MAALOX/MYLANTA) 200-200-20 MG/5ML suspension 30 mL  30 mL Oral Q4H PRN Andrea Pert, MD      . escitalopram (LEXAPRO) tablet 10 mg  10 mg Oral Daily Andrea Pert, MD   10 mg at 04/05/19 5400  . hydrOXYzine (ATARAX/VISTARIL) tablet 25 mg  25 mg Oral TID PRN Andrea Pert, MD      . magnesium hydroxide (MILK OF MAGNESIA) suspension 30 mL  30 mL Oral Daily PRN Andrea Pert, MD      . meclizine (ANTIVERT) tablet 12.5 mg  12.5 mg Oral BID Andrea Pert, MD   12.5 mg at 04/05/19 8676  .  risperiDONE (RISPERDAL) tablet 0.5 mg  0.5 mg Oral Daily Andrea Covert, MD   0.5 mg at 04/05/19 1324  . risperiDONE (RISPERDAL) tablet 1.5 mg  1.5 mg Oral QHS Andrea Covert, MD   1.5 mg at 04/04/19 2129  . traZODone (DESYREL) tablet 25 mg  25 mg Oral QHS PRN Andrea Covert, MD       Lab Results:  No results found for this or any previous visit (from the past 48 hour(s)).  Blood Alcohol level:  Lab Results  Component Value Date   ETH <10 04/02/2019   ETH <10 40/11/2723   Metabolic Disorder Labs: Lab Results  Component Value Date   HGBA1C 5.6 04/02/2019   MPG 114.02 04/02/2019   No results found for: PROLACTIN Lab Results  Component Value Date   CHOL 167 04/02/2019   TRIG 44 04/02/2019   HDL 45 04/02/2019   CHOLHDL 3.7 04/02/2019   VLDL 9 04/02/2019   LDLCALC 113 (H) 04/02/2019   Physical Findings: AIMS: Facial and Oral Movements Muscles of Facial Expression: None, normal Lips and Perioral Area: None, normal Jaw: None, normal Tongue: None, normal,Extremity Movements Upper (arms, wrists, hands, fingers): None, normal Lower (legs, knees, ankles, toes): None, normal, Trunk Movements Neck, shoulders, hips: None, normal, Overall Severity Severity  of abnormal movements (highest score from questions above): None, normal Incapacitation due to abnormal movements: None, normal Patient's awareness of abnormal movements (rate only patient's report): No Awareness, Dental Status Current problems with teeth and/or dentures?: No Does patient usually wear dentures?: No  CIWA:  CIWA-Ar Total: 0 COWS:  COWS Total Score: 2  Musculoskeletal: Strength & Muscle Tone: within normal limits Gait & Station: normal Patient leans: N/A  Psychiatric Specialty Exam: Physical Exam  Nursing note and vitals reviewed. Constitutional: She is oriented to person, place, and time. She appears well-developed and well-nourished.  HENT:  Head: Normocephalic and atraumatic.  Respiratory: Effort normal.  Neurological: She is alert and oriented to person, place, and time.  Skin: Skin is warm and dry.    Review of Systems  Constitutional: Negative for chills, diaphoresis and fever.  HENT: Negative for congestion, rhinorrhea, sneezing and sore throat.   Respiratory: Negative for cough, shortness of breath and wheezing.   Cardiovascular: Negative for chest pain and palpitations.  Gastrointestinal: Negative for diarrhea, nausea and vomiting.  Genitourinary: Negative for difficulty urinating.  Musculoskeletal: Negative for myalgias.  Allergic/Immunologic: Negative for environmental allergies and food allergies.  Neurological: Negative for dizziness, seizures, syncope, numbness and headaches.  Psychiatric/Behavioral: Positive for dysphoric mood ("Improving"). Negative for agitation, behavioral problems, confusion (My mind feels fuzzy), decreased concentration, hallucinations, self-injury, sleep disturbance and suicidal ideas. The patient is not nervous/anxious and is not hyperactive.     Blood pressure 116/74, pulse 87, temperature 98.6 F (37 C), temperature source Oral, resp. rate 16, height 5\' 5"  (1.651 m), weight 88.5 kg, SpO2 98 %.Body mass index is 32.45 kg/m.   General Appearance: Casual  Eye Contact:  Fair  Speech:  Normal Rate  Volume:  Normal  Mood:  Euthymic  Affect:  Appropriate  Thought Process:  Coherent and Descriptions of Associations: Loose  Orientation:  Full (Time, Place, and Person)  Thought Content:  Logical  Suicidal Thoughts:  Denies  Homicidal Thoughts:  Denies  Memory:  Immediate;   Fair Recent;   Fair Remote;   Fair  Judgement:  Intact  Insight:  Fair  Psychomotor Activity:  Normal and Increased  Concentration:  Concentration: Fair and Attention  Span: Fair  Recall:  [employer] of Knowledge:  Good  Language:  Good  Akathisia:  Negative  Handed:  Right  AIMS (if indicated):     Assets:  Desire for Improvement Resilience  ADL's:  Intact  Cognition:  WNL  Sleep:  Number of Hours: 6   Treatment Plan Summary: Daily contact with patient to assess and evaluate symptoms and progress in treatment, Medication management and Plan : Patient is seen and examined.  Patient is a 24 year old female with the above-stated past psychiatric history who is seen in follow-up.   - Continue inpatient hospitalization.  - Will continue today 04/05/2019 plan as below except where it is noted.  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  Her symptoms are essentially unchanged from admission. She says her mind feel fuzzy. Had started on Risperdal 0.5 mg p.o. daily and 1 mg p.o. nightly.  We will continue the Lexapro at 10 mg p.o. daily as well as hydroxyzine 25 mg p.o. 3 times daily as needed.  We will also continue trazodone 25 mg p.o. nightly as needed insomnia.  1. Depression.        - Continue Lexapro 10 mg p.o. daily for anxiety and depression. 2.  Anxiety         - Continue hydroxyzine 25 mg p.o. 3 times daily as needed anxiety.  3.  Stopped Seroquel.  4.   Mood control.        - Continue Risperdal 0.5 mg p.o. daily         -Continue Risperdal 1.5 mg p.o. nightly.  5. Neuro protection.       - Lovaza 1 g p.o. twice daily for   6.  Insomnia       - Continue trazodone 25 mg p.o. nightly as needed insomnia.  7.   - Disposition planning-in progress.  8.   - Encourage group participation.  Andrea Stammer, NP, PMHNP, FNP-BC. 04/05/2019, 12:53 PMPatient ID: Andrea Pugh, female   DOB: 11/13/1995, 24 y.o.   MRN: [id number] Patient ID: Andrea Pugh, female   DOB: 16-Jun-1995, 24 y.o.   MRN: [id number]

## 2019-04-05 NOTE — Plan of Care (Signed)
Nurse discussed anxiety, depression and coping skills with patient.  

## 2019-04-05 NOTE — Progress Notes (Addendum)
D:  Patient's self inventory sheet, patient sleeps good, sleep medication helpful.  Good appetite, normal energy level, good concentration.  Denied depression, hopeless and anxiety.  Denied withdrawals.  Denied SI.  Denied physical problems.  Denied physical pain.  Worst pain #1 in past 24 hours.  Goal is loving myself.  Plans to join groups.  No discharge plans. A:  Medications administered per MD orders.  Emotional support and encouragement given patient. R:  Denied SI and HI, contracts for safety.  Denied A/V hallucinations.  Safety maintained with 15 minute checks.

## 2019-04-05 NOTE — Progress Notes (Signed)
[facility] Group Notes:  (Nursing/MHT/Case Management/Adjunct)  Date:  04/05/2019  Time:  2030  Type of Therapy:  wrap up group  Participation Level:  Active  Participation Quality:  Appropriate, Attentive, Sharing and Supportive  Affect:  Appropriate  Cognitive:  Appropriate  Insight:  Improving  Engagement in Group:  Engaged  Modes of Intervention:  Clarification, Education and Support  Summary of Progress/Problems: Positive thinking and self care were discussed.   Marcille Buffy 04/05/2019, 9:35 PM

## 2019-04-05 NOTE — Progress Notes (Signed)
   04/04/19 2319  Psych Admission Type (Psych Patients Only)  Admission Status Voluntary  Psychosocial Assessment  Patient Complaints None  Eye Contact Fair  Facial Expression Anxious;Pensive  Affect Anxious;Appropriate to circumstance  Speech Logical/coherent  Interaction Assertive  Motor Activity Other (Comment) (WNL)  Appearance/Hygiene Unremarkable  Behavior Characteristics Cooperative;Calm  Mood Pleasant  Aggressive Behavior  Effect No apparent injury  Thought Process  Coherency WDL  Content Preoccupation  Delusions None reported or observed  Perception WDL  Hallucination None reported or observed  Judgment WDL  Confusion None  Danger to Self  Current suicidal ideation? Denies  Danger to Others  Danger to Others None reported or observed

## 2019-04-06 DIAGNOSIS — F333 Major depressive disorder, recurrent, severe with psychotic symptoms: Secondary | ICD-10-CM

## 2019-04-06 MED ORDER — TRAZODONE HCL 50 MG PO TABS: 25.0000 mg | ORAL_TABLET | Freq: Every evening | ORAL | 0 refills | Status: DC | PRN

## 2019-04-06 MED ORDER — RISPERIDONE 0.5 MG PO TABS: ORAL_TABLET | ORAL | 0 refills | Status: DC

## 2019-04-06 MED ORDER — ESCITALOPRAM OXALATE 10 MG PO TABS: 10.0000 mg | ORAL_TABLET | Freq: Every day | ORAL | 0 refills | Status: DC

## 2019-04-06 MED ORDER — HYDROXYZINE HCL 25 MG PO TABS: 25.0000 mg | ORAL_TABLET | Freq: Three times a day (TID) | ORAL | 0 refills | Status: DC | PRN

## 2019-04-06 MED ORDER — MECLIZINE HCL 12.5 MG PO TABS: 12.5000 mg | ORAL_TABLET | Freq: Two times a day (BID) | ORAL | 0 refills | Status: DC

## 2019-04-06 NOTE — Progress Notes (Signed)
Pt discharged to lobby. Pt was stable and appreciative at that time. All papers, samples and prescriptions were given and valuables returned. Verbal understanding expressed. Denies SI/HI and A/VH. Pt given opportunity to express concerns and ask questions.  

## 2019-04-06 NOTE — Progress Notes (Signed)
  [facility] Adult Case Management Discharge Plan :  Will you be returning to the same living situation after discharge:  Yes,  home At discharge, do you have transportation home?: Yes,  pts car is on campus Do you have the ability to pay for your medications: Yes,  mental health  Release of information consent forms completed and in the chart;    Patient to Follow up at: Follow-up Information    [facility] Follow up on 04/14/2019.   Why: You are scheduled for an appointment on 04/14/19 at 11:00 am.  This is a [facility] manager appointment.  Please have your insurance information and your discharge summary available.  Contact information: [address] [place] [address] [phone]           Next level of care provider has access to [facility] Link:no  Safety Planning and Suicide Prevention discussed: Yes,  SPE completed with pt as attempts to contact pts mothers were unsuccessful.     Has patient been referred to the Quitline?: Patient refused referral  Patient has been referred for addiction treatment: N/A  Mechele Dawley, LCSW 04/06/2019, 9:44 AM

## 2019-04-06 NOTE — Progress Notes (Signed)
Pt maintained on routine safety checks. Denies pain, auditory and visual hallucinations  Medications given as prescribed.  Support and encouragement offered as needed.  Attended group and participated.  Patient observed socializing with peers in the dayroom.  Offered no complaint.

## 2019-04-06 NOTE — Discharge Summary (Signed)
Physician Discharge Summary Note  Patient:  Andrea Pugh is an 24 y.o., female MRN:  [id number] DOB:  1995/10/03 Patient phone:  [phone] (home)  Patient address:   [address] [place] [address],  Total Time spent with patient: 15 minutes  Date of Admission:  04/01/2019 Date of Discharge: 04/06/19  Reason for Admission:  SI with paranoia, auditory hallucinations  Principal Problem: <principal problem not specified> Discharge Diagnoses: Active Problems:   MDD (major depressive disorder), recurrent episode, severe (HCC)   Psychosis ([place])   Past Psychiatric History: History of anxiety and depression. One prior hospitalization in June 2019 for SI with panic attacks. History of self-injurious behaviors (cutting) years ago.  Past Medical History:  Past Medical History:  Diagnosis Date  . Anxiety   . Depression    History reviewed. No pertinent surgical history. Family History: History reviewed. No pertinent family history. Family Psychiatric  History: Denies Social History:  Social History   Substance and Sexual Activity  Alcohol Use Yes   Comment: occasional     Social History   Substance and Sexual Activity  Drug Use Yes  . Types: Marijuana    Social History   Socioeconomic History  . Marital status: Single    Spouse name: Not on file  . Number of children: Not on file  . Years of education: Not on file  . Highest education level: Not on file  Occupational History  . Occupation: Ship broker  Tobacco Use  . Smoking status: Never Smoker  . Smokeless tobacco: Never Used  Substance and Sexual Activity  . Alcohol use: Yes    Comment: occasional  . Drug use: Yes    Types: Marijuana  . Sexual activity: Not Currently  Other Topics Concern  . Not on file  Social History Narrative   Pt currently lives in student housing at [employer].  She is currently contemplating continuing her education.  She also works at a [employer].  Pt  currently followed by [facility].   Social Determinants of Health   Financial Resource Strain:   . Difficulty of Paying Living Expenses: Not on file  Food Insecurity:   . Worried About Charity fundraiser in the Last Year: Not on file  . Ran Out of Food in the Last Year: Not on file  Transportation Needs:   . Lack of Transportation (Medical): Not on file  . Lack of Transportation (Non-Medical): Not on file  Physical Activity:   . Days of Exercise per Week: Not on file  . Minutes of Exercise per Session: Not on file  Stress:   . Feeling of Stress : Not on file  Social Connections:   . Frequency of Communication with Friends and Family: Not on file  . Frequency of Social Gatherings with Friends and Family: Not on file  . Attends Religious Services: Not on file  . Active Member of Clubs or Organizations: Not on file  . Attends Archivist Meetings: Not on file  . Marital Status: Not on file    Hospital Course:  From admission H&P: Patient is a 24 year old female with a past psychiatric history significant for depression and anxiety who presented to the behavioral health hospital on voluntary basis on 04/01/2019 with suicidal ideation and worsening depression.  The patient stated that she has been working in a [employer].  She stated that some of her coworkers were making comments.  She felt very suspicious about what they were saying.  She stated that they had accused her of stealing money from the cash register and making other problems at work.  This morning she stated that "I think they were just kidding but it may be worried".  She also talked about having auditory hallucinations which were primarily in her head, but she also noted that she had heard her neighbors voices while they were in their home.  The patient's last psychiatric admission at our facility was on July 31, 2017.  She was admitted at that time because of worsening depression and anxiety.  She also complained  of panic attacks at that time.  There was a mention of a social anxiety disorder and dad admission H&P, and the patient stated day with her past psychiatric history that she had social anxiety.  She denied any psychotic symptoms at that time, she also denied any previous physical, emotional or sexual trauma.  She admitted at that time that she had some isolated episodes of self cutting approximately a year prior to that admission.  She denied any drugs or alcohol.  She was admitted to the hospital for evaluation and stabilization.  Ms. Reeser was admitted for depression with suicidal ideation, paranoia, and auditory hallucinations. She remained on the [facility] unit for five days. Lexapro was continued. Seroquel was started, but patient complained of dizziness and excessive sedation. Seroquel was discontinued, and Risperdal was started. PRN Vistaril and trazodone were started. She participated in group therapy on the unit. She responded well to treatment. She has shown improved mood, affect, appetite, and interaction. She reports paranoia and auditory hallucinations have resolved with medication. She shows no signs of paranoia. No signs of responding to internal stimuli. She reports good mood and is future-oriented, with plans to look for a new job. She denies any SI/HI/AVH and contracts for safety. She is discharging on the medications listed below. She agrees to follow up at [facility] (see below). She is provided with prescriptions and medication samples upon discharge. She is discharging home via personal transportation.    Physical Findings: AIMS: Facial and Oral Movements Muscles of Facial Expression: None, normal Lips and Perioral Area: None, normal Jaw: None, normal Tongue: None, normal,Extremity Movements Upper (arms, wrists, hands, fingers): None, normal Lower (legs, knees, ankles, toes): None, normal, Trunk Movements Neck, shoulders, hips: None, normal, Overall Severity Severity of abnormal movements  (highest score from questions above): None, normal Incapacitation due to abnormal movements: None, normal Patient's awareness of abnormal movements (rate only patient's report): No Awareness, Dental Status Current problems with teeth and/or dentures?: No Does patient usually wear dentures?: No  CIWA:  CIWA-Ar Total: 0 COWS:  COWS Total Score: 2  Musculoskeletal: Strength & Muscle Tone: within normal limits Gait & Station: normal Patient leans: N/A  Psychiatric Specialty Exam: Physical Exam  Nursing note and vitals reviewed. Constitutional: She is oriented to person, place, and time. She appears well-developed and well-nourished.  Cardiovascular: Normal rate.  Respiratory: Effort normal.  Neurological: She is alert and oriented to person, place, and time.    Review of Systems  Constitutional: Negative.   Respiratory: Negative for cough and shortness of breath.   Psychiatric/Behavioral: Negative for agitation, behavioral problems, dysphoric mood, hallucinations, self-injury, sleep disturbance and suicidal ideas. The patient is not nervous/anxious and is not hyperactive.     Blood prssure 119/75. Heart rate 95. Respirations 20. Temperature 98.2. O2 sat 100%.  See MD's discharge SRA      Has this patient used any form of tobacco in the last  30 days? (Cigarettes, Smokeless Tobacco, Cigars, and/or Pipes)  No  Blood Alcohol level:  Lab Results  Component Value Date   ETH <10 04/02/2019   ETH <10 07/31/2017    Metabolic Disorder Labs:  Lab Results  Component Value Date   HGBA1C 5.6 04/02/2019   MPG 114.02 04/02/2019   No results found for: PROLACTIN Lab Results  Component Value Date   CHOL 167 04/02/2019   TRIG 44 04/02/2019   HDL 45 04/02/2019   CHOLHDL 3.7 04/02/2019   VLDL 9 04/02/2019   LDLCALC 113 (H) 04/02/2019    See Psychiatric Specialty Exam and Suicide Risk Assessment completed by Attending Physician prior to discharge.  Discharge destination:  Home  Is  patient on multiple antipsychotic therapies at discharge:  No   Has Patient had three or more failed trials of antipsychotic monotherapy by history:  No  Recommended Plan for Multiple Antipsychotic Therapies: NA  Discharge Instructions    Discharge instructions   Complete by: As directed    Patient is instructed to take all prescribed medications as recommended. Report any side effects or adverse reactions to your outpatient psychiatrist. Patient is instructed to abstain from alcohol and illegal drugs while on prescription medications. In the event of worsening symptoms, patient is instructed to call the crisis hotline, 911, or go to the nearest emergency department for evaluation and treatment.     Allergies as of 04/06/2019   No Known Allergies     Medication List    STOP taking these medications   FLUoxetine 20 MG capsule Commonly known as: PROZAC   metroNIDAZOLE 500 MG tablet Commonly known as: FLAGYL   naproxen 500 MG tablet Commonly known as: NAPROSYN   propranolol 10 MG tablet Commonly known as: INDERAL     TAKE these medications     Indication  escitalopram 10 MG tablet Commonly known as: LEXAPRO Take 1 tablet (10 mg total) by mouth daily.  Indication: Major Depressive Disorder   hydrOXYzine 25 MG tablet Commonly known as: ATARAX/VISTARIL Take 1 tablet (25 mg total) by mouth 3 (three) times daily as needed for anxiety. What changed: when to take this  Indication: Feeling Anxious   meclizine 12.5 MG tablet Commonly known as: ANTIVERT Take 1 tablet (12.5 mg total) by mouth 2 (two) times daily.  Indication: Sensation of Spinning or Whirling   risperiDONE 0.5 MG tablet Commonly known as: RISPERDAL Take one tablet (0.5 mg) by mouth every morning and three tablets (1.5 mg) by mouth at bedtime.  Indication: Psychosis   traZODone 50 MG tablet Commonly known as: DESYREL Take 0.5 tablets (25 mg total) by mouth at bedtime as needed for sleep. What changed: how  much to take  Indication: Trouble Sleeping      Follow-up Information    [facility] Follow up on 04/14/2019.   Why: You are scheduled for an appointment on 04/14/19 at 11:00 am.  This is a Heritage manager appointment.  Please have your insurance information and your discharge summary available.  Contact information: [address] [place] [address] [phone]           Follow-up recommendations: Activity as tolerated. Diet as recommended by primary care physician. Keep all scheduled follow-up appointments as recommended.   Comments:   Patient is instructed to take all prescribed medications as recommended. Report any side effects or adverse reactions to your outpatient psychiatrist. Patient is instructed to abstain from alcohol and illegal drugs while on prescription medications. In the event of worsening  symptoms, patient is instructed to call the crisis hotline, 911, or go to the nearest emergency department for evaluation and treatment.  Signed: Aldean Baker, NP 04/06/2019, 10:09 AM

## 2019-04-06 NOTE — BHH Suicide Risk Assessment (Signed)
[facility] Discharge Suicide Risk Assessment   Principal Problem: <principal problem not specified> Discharge Diagnoses: Active Problems:   MDD (major depressive disorder), recurrent episode, severe (HCC)   Psychosis (HCC)   Total Time spent with patient: 20 minutes  Musculoskeletal: Strength & Muscle Tone: within normal limits Gait & Station: normal Patient leans: N/A  Psychiatric Specialty Exam: Review of Systems  All other systems reviewed and are negative.   Blood pressure 111/72, pulse (!) 132, temperature 98.2 F (36.8 C), resp. rate 20, height 5\' 5"  (1.651 m), weight 88.5 kg, SpO2 100 %.Body mass index is 32.45 kg/m.  General Appearance: Casual  Eye Contact::  Good  Speech:  Normal Rate409  Volume:  Normal  Mood:  Euthymic  Affect:  Congruent  Thought Process:  Coherent and Descriptions of Associations: Intact  Orientation:  Full (Time, Place, and Person)  Thought Content:  Logical  Suicidal Thoughts:  No  Homicidal Thoughts:  No  Memory:  Immediate;   Good Recent;   Good Remote;   Good  Judgement:  Intact  Insight:  Fair  Psychomotor Activity:  Normal  Concentration:  Good  Recall:  Good  Fund of Knowledge:Good  Language: Good  Akathisia:  Negative  Handed:  Right  AIMS (if indicated):     Assets:  Desire for Improvement Housing Resilience Social Support Talents/Skills Transportation  Sleep:  Number of Hours: 5  Cognition: WNL  ADL's:  Intact   Mental Status Per Nursing Assessment::   On Admission:     Demographic Factors:  Low socioeconomic status  Loss Factors: NA  Historical Factors: Impulsivity  Risk Reduction Factors:   Living with another person, especially a relative and Positive social support  Continued Clinical Symptoms:  Severe Anxiety and/or Agitation Depression:   Impulsivity  Cognitive Features That Contribute To Risk:  None    Suicide Risk:  Minimal: No identifiable suicidal ideation.  Patients presenting with no risk  factors but with morbid ruminations; may be classified as minimal risk based on the severity of the depressive symptoms  Follow-up Information    [facility] Follow up on 04/14/2019.   Why: You are scheduled for an appointment on 04/14/19 at 11:00 am.  This is a 06/14/19 appointment.  Please have your insurance information and your discharge summary available.  Contact information: [address] [place] [phone]           Plan Of Care/Follow-up recommendations:  Activity:  ad lib  [phone], MD 04/06/2019, 9:13 AM

## 2019-04-06 NOTE — Progress Notes (Signed)
Recreation Therapy Notes  Date:  3.1.21 Time: 0930 Location: [address] Group Room  Group Topic: Stress Management  Goal Area(s) Addresses:  Patient will identify positive stress management techniques. Patient will identify benefits of using stress management post d/c.  Intervention: Stress Management  Activity :  [employer].  LRT played a meditation that focused on bringing the stillness and resilience of mountains into meditation and daily life.  Education:  Stress Management, Discharge Planning.   Education Outcome: Acknowledges Education  Clinical Observations/Feedback:  Pt did not attend session.    Caroll Rancher, LRT/CTRS         Caroll Rancher A 04/06/2019 11:14 AM

## 2019-04-11 ENCOUNTER — Ambulatory Visit (HOSPITAL_COMMUNITY)
Admission: RE | Admit: 2019-04-11 | Discharge: 2019-04-11 | Discharge status: Against Medical Advice | Payer: Self-pay | Attending: Psychiatry | Admitting: Psychiatry

## 2019-04-11 DIAGNOSIS — F603 Borderline personality disorder: Secondary | ICD-10-CM | POA: Insufficient documentation

## 2019-04-11 DIAGNOSIS — Z5329 Procedure and treatment not carried out because of patient's decision for other reasons: Secondary | ICD-10-CM | POA: Insufficient documentation

## 2019-04-11 DIAGNOSIS — Z79899 Other long term (current) drug therapy: Secondary | ICD-10-CM | POA: Insufficient documentation

## 2019-04-11 DIAGNOSIS — F333 Major depressive disorder, recurrent, severe with psychotic symptoms: Secondary | ICD-10-CM | POA: Insufficient documentation

## 2019-04-11 DIAGNOSIS — F419 Anxiety disorder, unspecified: Secondary | ICD-10-CM | POA: Insufficient documentation
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
## 2019-04-13 ENCOUNTER — Encounter (HOSPITAL_COMMUNITY): Payer: Self-pay | Admitting: Psychiatry

## 2019-04-13 ENCOUNTER — Ambulatory Visit (HOSPITAL_COMMUNITY): Admission: EM | Admit: 2019-04-13 | Payer: Medicaid Other | Admitting: Psychiatry

## 2019-04-13 ENCOUNTER — Encounter (HOSPITAL_COMMUNITY): Payer: Self-pay | Admitting: Registered Nurse

## 2019-04-13 ENCOUNTER — Other Ambulatory Visit: Payer: Self-pay

## 2019-04-13 ENCOUNTER — Inpatient Hospital Stay (HOSPITAL_COMMUNITY)
Admission: AD | Admit: 2019-04-13 | Discharge: 2019-04-17 | DRG: 885 | Disposition: A | Discharge status: Home / Self Care | Payer: Federal, State, Local not specified - Other | Source: Intra-hospital | Attending: Psychiatry | Admitting: Psychiatry

## 2019-04-13 DIAGNOSIS — Z79899 Other long term (current) drug therapy: Secondary | ICD-10-CM

## 2019-04-13 DIAGNOSIS — F29 Unspecified psychosis not due to a substance or known physiological condition: Secondary | ICD-10-CM | POA: Diagnosis present

## 2019-04-13 DIAGNOSIS — F319 Bipolar disorder, unspecified: Principal | ICD-10-CM | POA: Diagnosis present

## 2019-04-13 DIAGNOSIS — G4700 Insomnia, unspecified: Secondary | ICD-10-CM | POA: Diagnosis present

## 2019-04-13 DIAGNOSIS — Y92239 Unspecified place in hospital as the place of occurrence of the external cause: Secondary | ICD-10-CM | POA: Diagnosis not present

## 2019-04-13 DIAGNOSIS — W19XXXA Unspecified fall, initial encounter: Secondary | ICD-10-CM | POA: Diagnosis not present

## 2019-04-13 DIAGNOSIS — R45851 Suicidal ideations: Secondary | ICD-10-CM | POA: Diagnosis present

## 2019-04-13 DIAGNOSIS — F603 Borderline personality disorder: Secondary | ICD-10-CM | POA: Diagnosis present

## 2019-04-13 DIAGNOSIS — F410 Panic disorder [episodic paroxysmal anxiety] without agoraphobia: Secondary | ICD-10-CM | POA: Diagnosis present

## 2019-04-13 DIAGNOSIS — Z20822 Contact with and (suspected) exposure to covid-19: Secondary | ICD-10-CM | POA: Diagnosis present

## 2019-04-13 DIAGNOSIS — Z9114 Patient's other noncompliance with medication regimen: Secondary | ICD-10-CM

## 2019-04-13 LAB — COMPREHENSIVE METABOLIC PANEL
ALT: 15 U/L (ref 0–44)
AST: 16 U/L (ref 15–41)
Albumin: 4.2 g/dL (ref 3.5–5.0)
Alkaline Phosphatase: 61 U/L (ref 38–126)
Anion gap: 9 (ref 5–15)
BUN: 9 mg/dL (ref 6–20)
CO2: 23 mmol/L (ref 22–32)
Calcium: 9.3 mg/dL (ref 8.9–10.3)
Chloride: 105 mmol/L (ref 98–111)
Creatinine, Ser: 0.6 mg/dL (ref 0.44–1.00)
GFR calc Af Amer: >60 mL/min (ref >60)
GFR calc non Af Amer: >60 mL/min (ref >60)
Glucose, Bld: 122 mg/dL — ABNORMAL HIGH (ref 70–99)
Potassium: 3.8 mmol/L (ref 3.5–5.1)
Sodium: 137 mmol/L (ref 135–145)
Total Bilirubin: 0.9 mg/dL (ref 0.3–1.2)
Total Protein: 7.8 g/dL (ref 6.5–8.1)

## 2019-04-13 LAB — CBC
HCT: 38 % (ref 36.0–46.0)
Hemoglobin: 13 g/dL (ref 12.0–15.0)
MCH: 31.9 pg (ref 26.0–34.0)
MCHC: 34.2 g/dL (ref 30.0–36.0)
MCV: 93.4 fL (ref 80.0–100.0)
Platelets: 333 10*3/uL (ref 150–400)
RBC: 4.07 MIL/uL (ref 3.87–5.11)
RDW: 12.9 % (ref 11.5–15.5)
WBC: 8.1 10*3/uL (ref 4.0–10.5)
nRBC: 0 % (ref 0.0–0.2)

## 2019-04-13 LAB — RESPIRATORY PANEL BY RT PCR (FLU A&B, COVID)
Influenza A by PCR: NEGATIVE
Influenza B by PCR: NEGATIVE
SARS Coronavirus 2 by RT PCR: NEGATIVE

## 2019-04-13 LAB — ETHANOL: Alcohol, Ethyl (B): <10 mg/dL (ref <10)

## 2019-04-13 MED ORDER — MECLIZINE HCL 12.5 MG PO TABS
12.5000 mg | ORAL_TABLET | Freq: Two times a day (BID) | ORAL | Status: DC
  Administered 2019-04-13 – 2019-04-17 (×8): 12.5 mg via ORAL
  Filled 2019-04-13: qty 1
  Filled 2019-04-13: qty 14
  Filled 2019-04-13 (×3): qty 1
  Filled 2019-04-13: qty 14
  Filled 2019-04-13 (×6): qty 1
  Filled 2019-04-13: qty 14
  Filled 2019-04-13: qty 1
  Filled 2019-04-13: qty 14
  Filled 2019-04-13 (×2): qty 1

## 2019-04-13 MED ORDER — TRAZODONE HCL 50 MG PO TABS: 25.0000 mg | ORAL_TABLET | Freq: Every evening | ORAL | Status: DC | PRN

## 2019-04-13 MED ORDER — RISPERIDONE 0.5 MG PO TABS
0.5000 mg | ORAL_TABLET | Freq: Every morning | ORAL | Status: DC
  Administered 2019-04-14: 07:00:00 0.5 mg via ORAL
  Filled 2019-04-13: qty 1

## 2019-04-13 MED ORDER — ESCITALOPRAM OXALATE 10 MG PO TABS
10.0000 mg | ORAL_TABLET | Freq: Every day | ORAL | Status: DC
  Administered 2019-04-13 – 2019-04-17 (×5): 10 mg via ORAL
  Filled 2019-04-13 (×7): qty 1

## 2019-04-13 MED ORDER — HYDROXYZINE HCL 25 MG PO TABS
25.0000 mg | ORAL_TABLET | Freq: Three times a day (TID) | ORAL | Status: DC | PRN
  Administered 2019-04-14 – 2019-04-15 (×2): 25 mg via ORAL
  Filled 2019-04-13 (×2): qty 1

## 2019-04-13 MED ORDER — RISPERIDONE 0.5 MG PO TABS
1.5000 mg | ORAL_TABLET | Freq: Every day | ORAL | Status: DC
  Administered 2019-04-13: 21:00:00 1.5 mg via ORAL
  Filled 2019-04-13: qty 3
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
## 2019-04-13 NOTE — BHH Counselor (Signed)
Patient seen earlier this date with myself and Denzil Magnuson, NP. Patient discharged to follow up with her [facility] appointment on 3/9. Patient returns 1-2 hours later with an IVC, initiated by roommate. Per IVC: "Respondent diagnosed with bipolar disorder and taking meds as prescribed. This morning respondent reported hearing voices in her brain. She believes that someone is out to get her. She said that she has two sides of herself- one is trying to harm her and the other is trying to save her. She believes her ex is dead and questioned if she killed someone. She is a danger to herself and others."  Shuvon Rankin, NP recommends patient be admitted to [facility] Observation unit pending a negative Covid test.
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
## 2019-04-13 NOTE — Progress Notes (Signed)
Patient ID: Andrea Pugh, female   DOB: 07-05-1995, 24 y.o.   MRN: [id number] Pt awake, alert & responsive, no distress noted, resting comfortably at present.  Denies SI, HI, hearing voices.  Calm & cooperative at present.  Monitoring for safety.

## 2019-04-13 NOTE — Progress Notes (Signed)
Pt transferred from 200 hall to 404-1 for over night observation, pt ambulatory, alert and oriented.  Pt has been cooperative, took all her  meds as scheduled, denied SI/HI at this time and contracted for safety, will continue to monitor.
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
## 2019-04-14 DIAGNOSIS — F603 Borderline personality disorder: Secondary | ICD-10-CM | POA: Diagnosis present

## 2019-04-14 DIAGNOSIS — F319 Bipolar disorder, unspecified: Secondary | ICD-10-CM | POA: Diagnosis present

## 2019-04-14 DIAGNOSIS — F2081 Schizophreniform disorder: Secondary | ICD-10-CM

## 2019-04-14 DIAGNOSIS — Z79899 Other long term (current) drug therapy: Secondary | ICD-10-CM | POA: Diagnosis not present

## 2019-04-14 DIAGNOSIS — Z9114 Patient's other noncompliance with medication regimen: Secondary | ICD-10-CM | POA: Diagnosis not present

## 2019-04-14 DIAGNOSIS — W19XXXA Unspecified fall, initial encounter: Secondary | ICD-10-CM | POA: Diagnosis not present

## 2019-04-14 DIAGNOSIS — Y92239 Unspecified place in hospital as the place of occurrence of the external cause: Secondary | ICD-10-CM | POA: Diagnosis not present

## 2019-04-14 DIAGNOSIS — Z20822 Contact with and (suspected) exposure to covid-19: Secondary | ICD-10-CM | POA: Diagnosis present

## 2019-04-14 DIAGNOSIS — R45851 Suicidal ideations: Secondary | ICD-10-CM | POA: Diagnosis present

## 2019-04-14 DIAGNOSIS — F410 Panic disorder [episodic paroxysmal anxiety] without agoraphobia: Secondary | ICD-10-CM | POA: Diagnosis present

## 2019-04-14 DIAGNOSIS — G4700 Insomnia, unspecified: Secondary | ICD-10-CM | POA: Diagnosis present

## 2019-04-14 DIAGNOSIS — F29 Unspecified psychosis not due to a substance or known physiological condition: Secondary | ICD-10-CM | POA: Diagnosis present

## 2019-04-14 LAB — RAPID URINE DRUG SCREEN, HOSP PERFORMED
Amphetamines: NOT DETECTED
Barbiturates: NOT DETECTED
Benzodiazepines: NOT DETECTED
Cocaine: NOT DETECTED
Opiates: NOT DETECTED
Tetrahydrocannabinol: NOT DETECTED

## 2019-04-14 LAB — URINALYSIS, ROUTINE W REFLEX MICROSCOPIC
Bilirubin Urine: NEGATIVE
Glucose, UA: NEGATIVE mg/dL
Hgb urine dipstick: NEGATIVE
Ketones, ur: 5 mg/dL — AB
Leukocytes,Ua: NEGATIVE
Nitrite: NEGATIVE
Protein, ur: 100 mg/dL — AB
Specific Gravity, Urine: 1.032 — ABNORMAL HIGH (ref 1.005–1.030)
pH: 6 (ref 5.0–8.0)

## 2019-04-14 LAB — PROLACTIN: Prolactin: 66.1 ng/mL — ABNORMAL HIGH (ref 4.8–23.3)

## 2019-04-14 LAB — PREGNANCY, URINE: Preg Test, Ur: NEGATIVE

## 2019-04-14 MED ORDER — BENZTROPINE MESYLATE 0.5 MG PO TABS
0.5000 mg | ORAL_TABLET | Freq: Two times a day (BID) | ORAL | Status: DC
  Administered 2019-04-14 – 2019-04-17 (×6): 0.5 mg via ORAL
  Filled 2019-04-14 (×2): qty 1
  Filled 2019-04-14: qty 14
  Filled 2019-04-14 (×3): qty 1
  Filled 2019-04-14: qty 14
  Filled 2019-04-14 (×2): qty 1
  Filled 2019-04-14 (×2): qty 14
  Filled 2019-04-14: qty 1

## 2019-04-14 MED ORDER — RISPERIDONE 3 MG PO TABS
3.0000 mg | ORAL_TABLET | Freq: Every day | ORAL | Status: DC
  Administered 2019-04-14 – 2019-04-16 (×3): 3 mg via ORAL
  Filled 2019-04-14 (×4): qty 1

## 2019-04-14 MED ORDER — OMEGA-3-ACID ETHYL ESTERS 1 G PO CAPS
1.0000 g | ORAL_CAPSULE | Freq: Two times a day (BID) | ORAL | Status: DC
  Administered 2019-04-17: 10:00:00 1 g via ORAL
  Filled 2019-04-14 (×10): qty 1

## 2019-04-14 MED ORDER — RISPERIDONE 1 MG PO TABS
1.0000 mg | ORAL_TABLET | Freq: Every morning | ORAL | Status: DC
  Administered 2019-04-15 – 2019-04-16 (×2): 1 mg via ORAL
  Filled 2019-04-14 (×3): qty 1

## 2019-04-14 MED ORDER — TRAZODONE HCL 150 MG PO TABS
150.0000 mg | ORAL_TABLET | Freq: Every day | ORAL | Status: DC
  Administered 2019-04-14: 20:00:00 150 mg via ORAL
  Filled 2019-04-14 (×2): qty 1

## 2019-04-14 MED ORDER — ACETAMINOPHEN 325 MG PO TABS
650.0000 mg | ORAL_TABLET | Freq: Four times a day (QID) | ORAL | Status: DC | PRN
  Administered 2019-04-14 – 2019-04-15 (×2): 650 mg via ORAL
  Filled 2019-04-14 (×2): qty 2

## 2019-04-14 NOTE — Tx Team (Signed)
Initial Treatment Plan 04/14/2019 1:59 PM Charlaine Dalton AVW:[id number]    PATIENT STRESSORS: Health problems Medication change or noncompliance Traumatic event   PATIENT STRENGTHS: Ability for insight Communication skills Motivation for treatment/growth Supportive family/friends   PATIENT IDENTIFIED PROBLEMS: anxiety  paranoia  disorientation                 DISCHARGE CRITERIA:  Ability to meet basic life and health needs Improved stabilization in mood, thinking, and/or behavior Medical problems require only outpatient monitoring Motivation to continue treatment in a less acute level of care  PRELIMINARY DISCHARGE PLAN: Attend aftercare/continuing care group Return to previous living arrangement  PATIENT/FAMILY INVOLVEMENT: This treatment plan has been presented to and reviewed with the patient, Andrea Pugh.  The patient and family have been given the opportunity to ask questions and make suggestions.  Raylene Miyamoto, RN 04/14/2019, 1:59 PM

## 2019-04-14 NOTE — [facility] Suicide Risk Assessment (Signed)
[facility] INPATIENT:  Family/Significant Other Suicide Prevention Education  Suicide Prevention Education:  Patient Refusal for Family/Significant Other Suicide Prevention Education: The patient Andrea Pugh has refused to provide written consent for family/significant other to be provided Family/Significant Other Suicide Prevention Education during admission and/or prior to discharge.  Physician notified.  Darreld Mclean 04/14/2019, 3:04 PM

## 2019-04-14 NOTE — H&P (Addendum)
Psychiatric Admission Assessment Adult  Patient Identification: Andrea Pugh MRN:  [id number] Date of Evaluation:  04/14/2019 Chief Complaint: Confusion, reporting auditory hallucinations, reporting reckless driving Principal Diagnosis: Presenting with new onset psychosis Diagnosis: Past diagnoses listed depression recurrent and severe, and borderline personality disorder.    History of Present Illness:  This is a repeat admission for Andrea Pugh, she is 24 years of age and she presented voluntarily on 3/6, once again on 3/8, however she had recently been discharged from our inpatient facility on 3/1.  Though she presented 5 days after her initial discharge complaining of auditory hallucinations, uncertainty as to her medication dosing and regimen, and confusion.  Therefore, she left the facility prior to the completion of the assessment, only to re-present on 3/8.  As discussed her chief complaints revolve around feeling confused and "not knowing what is going on" she describes her self is driving recklessly because voices were telling her which direction to go.  She describes her self is obsessing but cannot elaborate on what that involves.  Patient is employed she works in [employer] she does not abuse drugs.  She has been diagnosed with depression but states she is not particularly depressed at this point time.  Evaluated earlier yesterday, she made some other paranoid statements as below Patient states that she is feeling worse "I feel like I have a split personality.  I go to school with white people and hang around black people and act different.  I have one side telling me to kill myself and another side telling me to save myself.  I don't know what's wrong with me; I feel like I'm going crazy.  I don't know if I hear voices but something telling me to do stuff."  Patient is tearful, rocking back and forth while sitting in chair.  Patient also states that she has not been taking her  medications except for 2 days after she was discharged 04/06/19.    At the present time patient calm and cooperative still making same type of statements but believes she is already better due to getting some rest.  Associated Signs/Symptoms: Depression Symptoms:  insomnia, (Hypo) Manic Symptoms:  Distractibility, Anxiety Symptoms:  Excessive Worry, Psychotic Symptoms:  Hallucinations: Auditory PTSD Symptoms: NA Total Time spent with patient: 45 minutes  Past Psychiatric History:  During the admission of 2/4-3/1 for depression and the presumption was psychosis was secondary to the depression, she believed coworkers were talking about her and accusing her pulse problems, stealing money, and she reported auditory hallucinations described as "inside her head" but also heard the neighbors voices while they were present.  She was treated again for psychotic depression given citalopram  Is the patient at risk to self? Yes.    Has the patient been a risk to self in the past 6 months? No.  Has the patient been a risk to self within the distant past? No.  Is the patient a risk to others? No.  Has the patient been a risk to others in the past 6 months? No.  Has the patient been a risk to others within the distant past? No.   Prior Inpatient Therapy:   Prior Outpatient Therapy:    Alcohol Screening: 1. How often do you have a drink containing alcohol?: 2 to 3 times a week 2. How many drinks containing alcohol do you have on a typical day when you are drinking?: 1 or 2 3. How often do you have six or more drinks on  one occasion?: Never AUDIT-C Score: 3 4. How often during the last year have you found that you were not able to stop drinking once you had started?: Never 5. How often during the last year have you failed to do what was normally expected from you becasue of drinking?: Never 6. How often during the last year have you needed a first drink in the morning to get yourself going after a heavy  drinking session?: Never 7. How often during the last year have you had a feeling of guilt of remorse after drinking?: Never 8. How often during the last year have you been unable to remember what happened the night before because you had been drinking?: Never 9. Have you or someone else been injured as a result of your drinking?: No 10. Has a relative or friend or a doctor or another health worker been concerned about your drinking or suggested you cut down?: No Alcohol Use Disorder Identification Test Final Score (AUDIT): 3 Substance Abuse History in the last 12 months:  No. Consequences of Substance Abuse: NA Previous Psychotropic Medications: Yes  Psychological Evaluations: No  Past Medical History:  Past Medical History:  Diagnosis Date  . Anxiety   . Depression    History reviewed. No pertinent surgical history. Family History: History reviewed. No pertinent family history. Family Psychiatric  History: see eval Tobacco Screening:   Social History:  Social History   Substance and Sexual Activity  Alcohol Use Yes   Comment: occasional     Social History   Substance and Sexual Activity  Drug Use Yes  . Types: Marijuana    Additional Social History:                           Allergies:  No Known Allergies Lab Results:  Results for orders placed or performed during the hospital encounter of 04/13/19 (from the past 48 hour(s))  Respiratory Panel by RT PCR (Flu A&B, Covid) - Nasopharyngeal Swab     Status: None   Collection Time: 04/13/19  4:15 PM   Specimen: Nasopharyngeal Swab  Result Value Ref Range   SARS Coronavirus 2 by RT PCR NEGATIVE NEGATIVE    Comment: (NOTE) SARS-CoV-2 target nucleic acids are NOT DETECTED. The SARS-CoV-2 RNA is generally detectable in upper respiratoy specimens during the acute phase of infection. The lowest concentration of SARS-CoV-2 viral copies this assay can detect is 131 copies/mL. A negative result does not preclude  SARS-Cov-2 infection and should not be used as the sole basis for treatment or other patient management decisions. A negative result may occur with  improper specimen collection/handling, submission of specimen other than nasopharyngeal swab, presence of viral mutation(s) within the areas targeted by this assay, and inadequate number of viral copies (<131 copies/mL). A negative result must be combined with clinical observations, patient history, and epidemiological information. The expected result is Negative. Fact Sheet for Patients:  PinkCheek.be Fact Sheet for Healthcare Providers:  GravelBags.it This test is not yet ap proved or cleared by the [place] FDA and  has been authorized for detection and/or diagnosis of SARS-CoV-2 by FDA under an Emergency Use Authorization (EUA). This EUA will remain  in effect (meaning this test can be used) for the duration of the COVID-19 declaration under Section 564(b)(1) of the Act, 21 U.S.C. section 360bbb-3(b)(1), unless the authorization is terminated or revoked sooner.    Influenza A by PCR NEGATIVE NEGATIVE   Influenza B by PCR  NEGATIVE NEGATIVE    Comment: (NOTE) The Xpert Xpress SARS-CoV-2/FLU/RSV assay is intended as an aid in  the diagnosis of influenza from Nasopharyngeal swab specimens and  should not be used as a sole basis for treatment. Nasal washings and  aspirates are unacceptable for Xpert Xpress SARS-CoV-2/FLU/RSV  testing. Fact Sheet for Patients: https://www.moore.com/ Fact Sheet for Healthcare Providers: https://www.young.biz/ This test is not yet approved or cleared by the [place] FDA and  has been authorized for detection and/or diagnosis of SARS-CoV-2 by  FDA under an Emergency Use Authorization (EUA). This EUA will remain  in effect (meaning this test can be used) for the duration of the  Covid-19 declaration  under Section 564(b)(1) of the Act, 21  U.S.C. section 360bbb-3(b)(1), unless the authorization is  terminated or revoked. Performed at [facility], [address], [place], [place] [address]   Ethanol     Status: None   Collection Time: 04/13/19  6:12 PM  Result Value Ref Range   Alcohol, Ethyl (B) <10 <10 mg/dL    Comment: (NOTE) Lowest detectable limit for serum alcohol is 10 mg/dL. For medical purposes only. Performed at [facility], [address], [place], [place] [address]   Comprehensive metabolic panel     Status: Abnormal   Collection Time: 04/13/19  6:12 PM  Result Value Ref Range   Sodium 137 135 - 145 mmol/L   Potassium 3.8 3.5 - 5.1 mmol/L   Chloride 105 98 - 111 mmol/L   CO2 23 22 - 32 mmol/L   Glucose, Bld 122 (H) 70 - 99 mg/dL    Comment: Glucose reference range applies only to samples taken after fasting for at least 8 hours.   BUN 9 6 - 20 mg/dL   Creatinine, Ser 9.83 0.44 - 1.00 mg/dL   Calcium 9.3 8.9 - 38.2 mg/dL   Total Protein 7.8 6.5 - 8.1 g/dL   Albumin 4.2 3.5 - 5.0 g/dL   AST 16 15 - 41 U/L   ALT 15 0 - 44 U/L   Alkaline Phosphatase 61 38 - 126 U/L   Total Bilirubin 0.9 0.3 - 1.2 mg/dL   GFR calc non Af Amer >60 >60 mL/min   GFR calc Af Amer >60 >60 mL/min   Anion gap 9 5 - 15    Comment: Performed at [facility], [address], [place], [place] [address]  CBC     Status: None   Collection Time: 04/13/19  6:12 PM  Result Value Ref Range   WBC 8.1 4.0 - 10.5 K/uL   RBC 4.07 3.87 - 5.11 MIL/uL   Hemoglobin 13.0 12.0 - 15.0 g/dL   HCT 76.7 34.1 - 93.7 %   MCV 93.4 80.0 - 100.0 fL   MCH 31.9 26.0 - 34.0 pg   MCHC 34.2 30.0 - 36.0 g/dL   RDW 90.2 40.9 - 73.5 %   Platelets 333 150 - 400 K/uL   nRBC 0.0 0.0 - 0.2 %    Comment: Performed at [facility], [address] [place], [place] [address]  Prolactin     Status: Abnormal   Collection Time: 04/13/19  6:12 PM   Result Value Ref Range   Prolactin 66.1 (H) 4.8 - 23.3 ng/mL    Comment: (NOTE) Performed At: [facility] [address] [place], [place] [id number] Jolene Schimke MD QI:[id number]   Urinalysis, Routine w reflex microscopic     Status: Abnormal  Collection Time: 04/13/19 10:00 PM  Result Value Ref Range   Color, Urine YELLOW YELLOW   APPearance CLEAR CLEAR   Specific Gravity, Urine 1.032 (H) 1.005 - 1.030   pH 6.0 5.0 - 8.0   Glucose, UA NEGATIVE NEGATIVE mg/dL   Hgb urine dipstick NEGATIVE NEGATIVE   Bilirubin Urine NEGATIVE NEGATIVE   Ketones, ur 5 (A) NEGATIVE mg/dL   Protein, ur 161 (A) NEGATIVE mg/dL   Nitrite NEGATIVE NEGATIVE   Leukocytes,Ua NEGATIVE NEGATIVE   RBC / HPF 6-10 0 - 5 RBC/hpf   WBC, UA 0-5 0 - 5 WBC/hpf   Bacteria, UA RARE (A) NONE SEEN   Squamous Epithelial / LPF 0-5 0 - 5   Mucus PRESENT    Ca Oxalate Crys, UA PRESENT     Comment: Performed at [facility], [address], [place], [place] [address]  Pregnancy, urine     Status: None   Collection Time: 04/13/19 10:00 PM  Result Value Ref Range   Preg Test, Ur NEGATIVE NEGATIVE    Comment:        THE SENSITIVITY OF THIS METHODOLOGY IS >20 mIU/mL. Performed at [facility], [address] [place], [place] [address]   Urine rapid drug screen (hosp performed)not at [facility]     Status: None   Collection Time: 04/13/19 10:00 PM  Result Value Ref Range   Opiates NONE DETECTED NONE DETECTED   Cocaine NONE DETECTED NONE DETECTED   Benzodiazepines NONE DETECTED NONE DETECTED   Amphetamines NONE DETECTED NONE DETECTED   Tetrahydrocannabinol NONE DETECTED NONE DETECTED   Barbiturates NONE DETECTED NONE DETECTED    Comment: (NOTE) DRUG SCREEN FOR MEDICAL PURPOSES ONLY.  IF CONFIRMATION IS NEEDED FOR ANY PURPOSE, NOTIFY LAB WITHIN 5 DAYS. LOWEST DETECTABLE LIMITS FOR URINE DRUG SCREEN Drug Class                     Cutoff (ng/mL) Amphetamine and metabolites     1000 Barbiturate and metabolites    200 Benzodiazepine                 200 Tricyclics and metabolites     300 Opiates and metabolites        300 Cocaine and metabolites        300 THC                            50 Performed at [facility], [address], [place], [place] [address]     Blood Alcohol level:  Lab Results  Component Value Date   [facility] <10 04/13/2019   ETH <10 04/02/2019    Metabolic Disorder Labs:  Lab Results  Component Value Date   HGBA1C 5.6 04/02/2019   MPG 114.02 04/02/2019   Lab Results  Component Value Date   PROLACTIN 66.1 (H) 04/13/2019   Lab Results  Component Value Date   CHOL 167 04/02/2019   TRIG 44 04/02/2019   HDL 45 04/02/2019   CHOLHDL 3.7 04/02/2019   VLDL 9 04/02/2019   LDLCALC 113 (H) 04/02/2019    Current Medications: Current Facility-Administered Medications  Medication Dose Route Frequency Provider Last Rate Last Admin  . benztropine (COGENTIN) tablet 0.5 mg  0.5 mg Oral BID Malvin Johns, MD      . escitalopram (LEXAPRO) tablet 10 mg  10 mg Oral Daily Rankin, Shuvon B, NP   10 mg at 04/14/19  1601  . hydrOXYzine (ATARAX/VISTARIL) tablet 25 mg  25 mg Oral TID PRN Rankin, Shuvon B, NP      . meclizine (ANTIVERT) tablet 12.5 mg  12.5 mg Oral BID Rankin, Shuvon B, NP   12.5 mg at 04/14/19 0925  . [START ON 04/15/2019] risperiDONE (RISPERDAL) tablet 1 mg  1 mg Oral q AM Malvin Johns, MD      . risperiDONE (RISPERDAL) tablet 3 mg  3 mg Oral QHS Malvin Johns, MD      . traZODone (DESYREL) tablet 25 mg  25 mg Oral QHS PRN Rankin, Shuvon B, NP       PTA Medications: Medications Prior to Admission  Medication Sig Dispense Refill Last Dose  . escitalopram (LEXAPRO) 10 MG tablet Take 1 tablet (10 mg total) by mouth daily. 30 tablet 0   . hydrOXYzine (ATARAX/VISTARIL) 25 MG tablet Take 1 tablet (25 mg total) by mouth 3 (three) times daily as needed for anxiety. 30 tablet 0   . meclizine (ANTIVERT) 12.5 MG tablet Take 1  tablet (12.5 mg total) by mouth 2 (two) times daily. 60 tablet 0   . risperiDONE (RISPERDAL) 0.5 MG tablet Take one tablet (0.5 mg) by mouth every morning and three tablets (1.5 mg) by mouth at bedtime. 120 tablet 0   . traZODone (DESYREL) 50 MG tablet Take 0.5 tablets (25 mg total) by mouth at bedtime as needed for sleep. 15 tablet 0     Musculoskeletal: Strength & Muscle Tone: within normal limits Gait & Station: normal Patient leans: N/A  Psychiatric Specialty Exam: Physical Exam  Nursing note and vitals reviewed. Constitutional: She appears well-developed and well-nourished.  Cardiovascular: Normal rate and regular rhythm.    Review of Systems  Constitutional: Negative.   Eyes: Negative.   Respiratory: Negative.   Cardiovascular: Negative.   Gastrointestinal: Negative.   Endocrine: Negative.   Genitourinary: Negative.     Blood pressure 118/77, pulse 89, temperature (!) 97.4 F (36.3 C), temperature source Oral, resp. rate 18, height 5\' 5"  (1.651 m), weight 88.5 kg, SpO2 100 %.Body mass index is 32.47 kg/m.  General Appearance: Casual  Eye Contact:  Good  Speech:  Clear and Coherent  Volume:  Normal  Mood:  dysphoric  Affect:  Blunt then tearful  Thought Process:  Descriptions of Associations: Circumstantial  Orientation:  Full (Time, Place, and Person)  Thought Content:  Delusions and Hallucinations: Auditory  Suicidal Thoughts:  No  Homicidal Thoughts:  No  Memory:  Immediate;   Fair Recent;   Fair Remote;   Fair  Judgement:  Fair  Insight:  Fair  Psychomotor Activity:  Normal  Concentration:  Concentration: Fair and Attention Span: Fair  Recall:  of Knowledge:  Fair  Language:  Fair  Akathisia:  Negative  Handed:  Right  AIMS (if indicated):     Assets:  Physical Health Resilience Social Support  ADL's:  Intact  Cognition:  WNL  Sleep:       Treatment Plan Summary: Daily contact with patient to assess and evaluate symptoms and progress in  treatment and Medication management  Observation Level/Precautions:  15 minute checks  Laboratory:  UDS  Psychotherapy: Cognitive/reality based  Medications: See eval add risperidone  Consultations:    Discharge Concerns: Diagnostic clarity  Estimated LOS:  Other:     Physician Treatment Plan for Primary Diagnosis:   Is 1 rule out schizophreniform disorder Rule out exacerbation depression with psychosis Rule out bipolar mixed with psychosis Axis  II deferred Axis III medically stable drug screening  Plans continue antidepressant, escalate risperidone  Long Term Goal(s): Improvement in symptoms so as ready for discharge  Short Term Goals: Ability to verbalize feelings will improve, Ability to disclose and discuss suicidal ideas, Ability to demonstrate self-control will improve and Ability to identify and develop effective coping behaviors will improve  Physician Treatment Plan for Secondary Diagnosis: Active Problems:   Psychotic disorder (HCC)   Bipolar disorder (HCC)  Long Term Goal(s): Improvement in symptoms so as ready for discharge  Short Term Goals: Ability to demonstrate self-control will improve, Ability to identify and develop effective coping behaviors will improve and Ability to identify triggers associated with substance abuse/mental health issues will improve  I certify that inpatient services furnished can reasonably be expected to improve the patient's condition.    Malvin Johns, MD 3/9/20212:18 PM
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
## 2019-04-14 NOTE — BH Assessment (Signed)
BHH Assessment Progress Note  Per Malvin Johns, MD, this pt requires psychiatric hospitalization.  Jasmine has assigned pt to [facility] Rm 507-1; the unit will be ready to receive pt at 13:00.  Pt presents under IVC initiated by pt's roommate, and upheld by Dr Jeannine Kitten.  IVC documents may be found on pt's chart.  Pt's nurse, Casimiro Needle, has been notified.   Doylene Canning, [place] [facility] Coordinator [phone]

## 2019-04-14 NOTE — BHH Suicide Risk Assessment (Signed)
[facility] Admission Suicide Risk Assessment   Nursing information obtained from:  Patient, Review of record Demographic factors:  Adolescent or young adult Current Mental Status:  NA Loss Factors:  Decline in physical health Historical Factors:  Impulsivity Risk Reduction Factors:  Sense of responsibility to family, Positive social support, Positive coping skills or problem solving skills, Living with another person, especially a relative, Positive therapeutic relationship  Total Time spent with patient: 45 minutes Principal Problem: <principal problem not specified> Diagnosis:  Active Problems:   Psychotic disorder (HCC)   Bipolar disorder (HCC)  Subjective Data: Presents with a cluster of symptoms involving complaints of confusion, reckless driving, auditory hallucinations possible, level of distress and dysphoria as well  Continued Clinical Symptoms:  Alcohol Use Disorder Identification Test Final Score (AUDIT): 3 The "Alcohol Use Disorders Identification Test", Guidelines for Use in Primary Care, Second Edition.  World Science writer [facility]). Score between 0-7:  no or low risk or alcohol related problems. Score between 8-15:  moderate risk of alcohol related problems. Score between 16-19:  high risk of alcohol related problems. Score 20 or above:  warrants further diagnostic evaluation for alcohol dependence and treatment.   CLINICAL FACTORS:   Schizophrenia:   Paranoid or undifferentiated type  Musculoskeletal: Strength & Muscle Tone: within normal limits Gait & Station: normal Patient leans: N/A  Psychiatric Specialty Exam: Physical Exam  Nursing note and vitals reviewed. Constitutional: She appears well-developed and well-nourished.  Cardiovascular: Normal rate and regular rhythm.    Review of Systems  Constitutional: Negative.   Eyes: Negative.   Respiratory: Negative.   Cardiovascular: Negative.   Gastrointestinal: Negative.   Endocrine: Negative.    Genitourinary: Negative.     Blood pressure 118/77, pulse 89, temperature (!) 97.4 F (36.3 C), temperature source Oral, resp. rate 18, height 5\' 5"  (1.651 m), weight 88.5 kg, SpO2 100 %.Body mass index is 32.47 kg/m.  General Appearance: Casual  Eye Contact:  Good  Speech:  Clear and Coherent  Volume:  Normal  Mood:  dysphoric  Affect:  Blunt then tearful  Thought Process:  Descriptions of Associations: Circumstantial  Orientation:  Full (Time, Place, and Person)  Thought Content:  Delusions and Hallucinations: Auditory  Suicidal Thoughts:  No  Homicidal Thoughts:  No  Memory:  Immediate;   Fair Recent;   Fair Remote;   Fair  Judgement:  Fair  Insight:  Fair  Psychomotor Activity:  Normal  Concentration:  Concentration: Fair and Attention Span: Fair  Recall:  of Knowledge:  Fair  Language:  Fair  Akathisia:  Negative  Handed:  Right  AIMS (if indicated):     Assets:  Physical Health Resilience Social Support  ADL's:  Intact  Cognition:  WNL  Sleep:       COGNITIVE FEATURES THAT CONTRIBUTE TO RISK:  Loss of executive function    SUICIDE RISK:   Minimal: No identifiable suicidal ideation.  Patients presenting with no risk factors but with morbid ruminations; may be classified as minimal risk based on the severity of the depressive symptoms  PLAN OF CARE: see eval  I certify that inpatient services furnished can reasonably be expected to improve the patient's condition.   [employer], MD 04/14/2019, 2:36 PM

## 2019-04-14 NOTE — Discharge Summary (Signed)
  Patient to be transferred to [facility] 507/1 for inpatient psychiatric treatment

## 2019-04-15 ENCOUNTER — Ambulatory Visit (HOSPITAL_COMMUNITY)
Admit: 2019-04-15 | Discharge: 2019-04-15 | Disposition: A | Discharge status: Home / Self Care | Payer: Self-pay | Source: Ambulatory Visit | Attending: Psychiatry | Admitting: Psychiatry

## 2019-04-15 DIAGNOSIS — Y939 Activity, unspecified: Secondary | ICD-10-CM | POA: Insufficient documentation

## 2019-04-15 DIAGNOSIS — Y929 Unspecified place or not applicable: Secondary | ICD-10-CM | POA: Insufficient documentation

## 2019-04-15 DIAGNOSIS — R42 Dizziness and giddiness: Secondary | ICD-10-CM | POA: Insufficient documentation

## 2019-04-15 DIAGNOSIS — W19XXXA Unspecified fall, initial encounter: Secondary | ICD-10-CM | POA: Insufficient documentation

## 2019-04-15 DIAGNOSIS — Y999 Unspecified external cause status: Secondary | ICD-10-CM | POA: Insufficient documentation

## 2019-04-15 MED ORDER — TRAZODONE HCL 100 MG PO TABS
100.0000 mg | ORAL_TABLET | Freq: Every day | ORAL | Status: DC
  Administered 2019-04-15: 21:00:00 100 mg via ORAL
  Filled 2019-04-15 (×3): qty 1

## 2019-04-15 MED ORDER — PRAZOSIN HCL 2 MG PO CAPS
4.0000 mg | ORAL_CAPSULE | Freq: Every day | ORAL | Status: DC
  Administered 2019-04-15 – 2019-04-16 (×2): 4 mg via ORAL
  Filled 2019-04-15: qty 14
  Filled 2019-04-15 (×3): qty 2
  Filled 2019-04-15: qty 14

## 2019-04-15 NOTE — Progress Notes (Signed)
   04/15/19 2013  Psych Admission Type (Psych Patients Only)  Admission Status Involuntary  Psychosocial Assessment  Patient Complaints None  Eye Contact Fair  Facial Expression Anxious;Sad  Affect Anxious;Sad  Speech Logical/coherent;Soft  Interaction Assertive  Motor Activity Slow  Appearance/Hygiene Unremarkable  Behavior Characteristics Cooperative  Mood Sad  Thought Process  Coherency WDL  Content WDL  Delusions None reported or observed  Perception WDL  Hallucination None reported or observed  Judgment Poor  Confusion None  Danger to Self  Current suicidal ideation? Denies  Danger to Others  Danger to Others None reported or observed   Pt states "I feel much better today."

## 2019-04-15 NOTE — Progress Notes (Signed)
[facility] MD Progress Note  04/15/2019 12:26 PM Andrea Pugh  MRN:  [id number] Subjective:   Patient seen in bed she is alert oriented to person place time and situation she reports no current auditory or visual hallucinations and reports no thoughts of harming self and she believes she has made progress and indeed she seems to have made progress her insight is good.  No involuntary movements.  We did add prazosin she has a history of trauma and some nightmares she elaborated more to the female students than to me which is fine Principal Problem: Dysphoria psychosis Diagnosis: Active Problems:   Psychotic disorder ([place])   Bipolar disorder ([place])  Total Time spent with patient: 20 minutes  Past Psychiatric History: see eval  Past Medical History:  Past Medical History:  Diagnosis Date  . Anxiety   . Depression    History reviewed. No pertinent surgical history. Family History: History reviewed. No pertinent family history. Family Psychiatric  History: see eval Social History:  Social History   Substance and Sexual Activity  Alcohol Use Yes   Comment: occasional     Social History   Substance and Sexual Activity  Drug Use Yes  . Types: Marijuana    Social History   Socioeconomic History  . Marital status: Single    Spouse name: Not on file  . Number of children: Not on file  . Years of education: Not on file  . Highest education level: Not on file  Occupational History  . Occupation: Ship broker  Tobacco Use  . Smoking status: Never Smoker  . Smokeless tobacco: Never Used  Substance and Sexual Activity  . Alcohol use: Yes    Comment: occasional  . Drug use: Yes    Types: Marijuana  . Sexual activity: Not Currently  Other Topics Concern  . Not on file  Social History Narrative   Pt currently lives in student housing at [employer].  She is currently contemplating continuing her education.  She also works at a [employer].  Pt currently followed by [facility].   Social  Determinants of Health   Financial Resource Strain:   . Difficulty of Paying Living Expenses: Not on file  Food Insecurity:   . Worried About Charity fundraiser in the Last Year: Not on file  . Ran Out of Food in the Last Year: Not on file  Transportation Needs:   . Lack of Transportation (Medical): Not on file  . Lack of Transportation (Non-Medical): Not on file  Physical Activity:   . Days of Exercise per Week: Not on file  . Minutes of Exercise per Session: Not on file  Stress:   . Feeling of Stress : Not on file  Social Connections:   . Frequency of Communication with Friends and Family: Not on file  . Frequency of Social Gatherings with Friends and Family: Not on file  . Attends Religious Services: Not on file  . Active Member of Clubs or Organizations: Not on file  . Attends Archivist Meetings: Not on file  . Marital Status: Not on file   Additional Social History:                         Sleep: Good  Appetite:  Good  Current Medications: Current Facility-Administered Medications  Medication Dose Route Frequency Provider Last Rate Last Admin  . acetaminophen (TYLENOL) tablet 650 mg  650 mg Oral Q6H PRN Rankin, Shuvon B, NP  650 mg at 04/15/19 1000  . benztropine (COGENTIN) tablet 0.5 mg  0.5 mg Oral BID Malvin Johns, MD   0.5 mg at 04/15/19 0809  . escitalopram (LEXAPRO) tablet 10 mg  10 mg Oral Daily Rankin, Shuvon B, NP   10 mg at 04/15/19 0809  . hydrOXYzine (ATARAX/VISTARIL) tablet 25 mg  25 mg Oral TID PRN Rankin, Shuvon B, NP   25 mg at 04/14/19 2026  . meclizine (ANTIVERT) tablet 12.5 mg  12.5 mg Oral BID Rankin, Shuvon B, NP   12.5 mg at 04/15/19 0810  . omega-3 acid ethyl esters (LOVAZA) capsule 1 g  1 g Oral BID Malvin Johns, MD      . prazosin (MINIPRESS) capsule 4 mg  4 mg Oral QHS Malvin Johns, MD      . risperiDONE (RISPERDAL) tablet 1 mg  1 mg Oral q AM Malvin Johns, MD   1 mg at 04/15/19 0809  . risperiDONE (RISPERDAL) tablet 3 mg   3 mg Oral QHS Malvin Johns, MD   3 mg at 04/14/19 2026  . traZODone (DESYREL) tablet 100 mg  100 mg Oral QHS Malvin Johns, MD        Lab Results:  Results for orders placed or performed during the hospital encounter of 04/13/19 (from the past 48 hour(s))  Respiratory Panel by RT PCR (Flu A&B, Covid) - Nasopharyngeal Swab     Status: None   Collection Time: 04/13/19  4:15 PM   Specimen: Nasopharyngeal Swab  Result Value Ref Range   SARS Coronavirus 2 by RT PCR NEGATIVE NEGATIVE    Comment: (NOTE) SARS-CoV-2 target nucleic acids are NOT DETECTED. The SARS-CoV-2 RNA is generally detectable in upper respiratoy specimens during the acute phase of infection. The lowest concentration of SARS-CoV-2 viral copies this assay can detect is 131 copies/mL. A negative result does not preclude SARS-Cov-2 infection and should not be used as the sole basis for treatment or other patient management decisions. A negative result may occur with  improper specimen collection/handling, submission of specimen other than nasopharyngeal swab, presence of viral mutation(s) within the areas targeted by this assay, and inadequate number of viral copies (<131 copies/mL). A negative result must be combined with clinical observations, patient history, and epidemiological information. The expected result is Negative. Fact Sheet for Patients:  https://www.moore.com/ Fact Sheet for Healthcare Providers:  https://www.young.biz/ This test is not yet ap proved or cleared by the [place] FDA and  has been authorized for detection and/or diagnosis of SARS-CoV-2 by FDA under an Emergency Use Authorization (EUA). This EUA will remain  in effect (meaning this test can be used) for the duration of the COVID-19 declaration under Section 564(b)(1) of the Act, 21 U.S.C. section 360bbb-3(b)(1), unless the authorization is terminated or revoked sooner.    Influenza A by PCR NEGATIVE  NEGATIVE   Influenza B by PCR NEGATIVE NEGATIVE    Comment: (NOTE) The Xpert Xpress SARS-CoV-2/FLU/RSV assay is intended as an aid in  the diagnosis of influenza from Nasopharyngeal swab specimens and  should not be used as a sole basis for treatment. Nasal washings and  aspirates are unacceptable for Xpert Xpress SARS-CoV-2/FLU/RSV  testing. Fact Sheet for Patients: https://www.moore.com/ Fact Sheet for Healthcare Providers: https://www.young.biz/ This test is not yet approved or cleared by the [place] FDA and  has been authorized for detection and/or diagnosis of SARS-CoV-2 by  FDA under an Emergency Use Authorization (EUA). This EUA will remain  in effect (meaning this test can  be used) for the duration of the  Covid-19 declaration under Section 564(b)(1) of the Act, 21  U.S.C. section 360bbb-3(b)(1), unless the authorization is  terminated or revoked. Performed at [facility], [address] [place], [place] [address]   Ethanol     Status: None   Collection Time: 04/13/19  6:12 PM  Result Value Ref Range   Alcohol, Ethyl (B) <10 <10 mg/dL    Comment: (NOTE) Lowest detectable limit for serum alcohol is 10 mg/dL. For medical purposes only. Performed at [facility], [address] [place], [place] [address]   Comprehensive metabolic panel     Status: Abnormal   Collection Time: 04/13/19  6:12 PM  Result Value Ref Range   Sodium 137 135 - 145 mmol/L   Potassium 3.8 3.5 - 5.1 mmol/L   Chloride 105 98 - 111 mmol/L   CO2 23 22 - 32 mmol/L   Glucose, Bld 122 (H) 70 - 99 mg/dL    Comment: Glucose reference range applies only to samples taken after fasting for at least 8 hours.   BUN 9 6 - 20 mg/dL   Creatinine, Ser 0.980.60 0.44 - 1.00 mg/dL   Calcium 9.3 8.9 - 11.910.3 mg/dL   Total Protein 7.8 6.5 - 8.1 g/dL   Albumin 4.2 3.5 - 5.0 g/dL   AST 16 15 - 41 U/L   ALT 15 0 - 44 U/L   Alkaline  Phosphatase 61 38 - 126 U/L   Total Bilirubin 0.9 0.3 - 1.2 mg/dL   GFR calc non Af Amer >60 >60 mL/min   GFR calc Af Amer >60 >60 mL/min   Anion gap 9 5 - 15    Comment: Performed at [facility], [address] [place], [place] [address]  CBC     Status: None   Collection Time: 04/13/19  6:12 PM  Result Value Ref Range   WBC 8.1 4.0 - 10.5 K/uL   RBC 4.07 3.87 - 5.11 MIL/uL   Hemoglobin 13.0 12.0 - 15.0 g/dL   HCT 95.638.0 21.336.0 - 08.646.0 %   MCV 93.4 80.0 - 100.0 fL   MCH 31.9 26.0 - 34.0 pg   MCHC 34.2 30.0 - 36.0 g/dL   RDW 57.812.9 46.911.5 - 62.915.5 %   Platelets 333 150 - 400 K/uL   nRBC 0.0 0.0 - 0.2 %    Comment: Performed at [facility], [address] [place], [place] [address]  Prolactin     Status: Abnormal   Collection Time: 04/13/19  6:12 PM  Result Value Ref Range   Prolactin 66.1 (H) 4.8 - 23.3 ng/mL    Comment: (NOTE) Performed At: [facility] [address] [place], [place] [id number] Jolene SchimkeNagendra Sanjai MD OZ:[id number]:[phone]   Urinalysis, Routine w reflex microscopic     Status: Abnormal   Collection Time: 04/13/19 10:00 PM  Result Value Ref Range   Color, Urine YELLOW YELLOW   APPearance CLEAR CLEAR   Specific Gravity, Urine 1.032 (H) 1.005 - 1.030   pH 6.0 5.0 - 8.0   Glucose, UA NEGATIVE NEGATIVE mg/dL   Hgb urine dipstick NEGATIVE NEGATIVE   Bilirubin Urine NEGATIVE NEGATIVE   Ketones, ur 5 (A) NEGATIVE mg/dL   Protein, ur 259100 (A) NEGATIVE mg/dL   Nitrite NEGATIVE NEGATIVE   Leukocytes,Ua NEGATIVE NEGATIVE   RBC / HPF 6-10 0 - 5 RBC/hpf   WBC, UA 0-5 0 - 5 WBC/hpf   Bacteria, UA RARE (  A) NONE SEEN   Squamous Epithelial / LPF 0-5 0 - 5   Mucus PRESENT    Ca Oxalate Crys, UA PRESENT     Comment: Performed at [facility], [address] [place], [place] [address]  Pregnancy, urine     Status: None   Collection Time: 04/13/19 10:00 PM  Result Value Ref Range   Preg Test, Ur NEGATIVE NEGATIVE     Comment:        THE SENSITIVITY OF THIS METHODOLOGY IS >20 mIU/mL. Performed at [facility], [address] [place], [place] [address]   Urine rapid drug screen (hosp performed)not at [facility]     Status: None   Collection Time: 04/13/19 10:00 PM  Result Value Ref Range   Opiates NONE DETECTED NONE DETECTED   Cocaine NONE DETECTED NONE DETECTED   Benzodiazepines NONE DETECTED NONE DETECTED   Amphetamines NONE DETECTED NONE DETECTED   Tetrahydrocannabinol NONE DETECTED NONE DETECTED   Barbiturates NONE DETECTED NONE DETECTED    Comment: (NOTE) DRUG SCREEN FOR MEDICAL PURPOSES ONLY.  IF CONFIRMATION IS NEEDED FOR ANY PURPOSE, NOTIFY LAB WITHIN 5 DAYS. LOWEST DETECTABLE LIMITS FOR URINE DRUG SCREEN Drug Class                     Cutoff (ng/mL) Amphetamine and metabolites    1000 Barbiturate and metabolites    200 Benzodiazepine                 200 Tricyclics and metabolites     300 Opiates and metabolites        300 Cocaine and metabolites        300 THC                            50 Performed at [facility], [address], [place], [place] [address]     Blood Alcohol level:  Lab Results  Component Value Date   ETH <10 04/13/2019   ETH <10 04/02/2019    Metabolic Disorder Labs: Lab Results  Component Value Date   HGBA1C 5.6 04/02/2019   MPG 114.02 04/02/2019   Lab Results  Component Value Date   PROLACTIN 66.1 (H) 04/13/2019   Lab Results  Component Value Date   CHOL 167 04/02/2019   TRIG 44 04/02/2019   HDL 45 04/02/2019   CHOLHDL 3.7 04/02/2019   VLDL 9 04/02/2019   LDLCALC 113 (H) 04/02/2019    Physical Findings: AIMS: Facial and Oral Movements Muscles of Facial Expression: None, normal Lips and Perioral Area: None, normal Jaw: None, normal Tongue: None, normal,Extremity Movements Upper (arms, wrists, hands, fingers): None, normal Lower (legs, knees, ankles, toes): None, normal, Trunk Movements Neck, shoulders,  hips: None, normal, Overall Severity Severity of abnormal movements (highest score from questions above): None, normal Incapacitation due to abnormal movements: None, normal Patient's awareness of abnormal movements (rate only patient's report): No Awareness, Dental Status Current problems with teeth and/or dentures?: No Does patient usually wear dentures?: No  CIWA:  CIWA-Ar Total: 0 COWS:  COWS Total Score: 1  Musculoskeletal: Strength & Muscle Tone: within normal limits Gait & Station: normal Patient leans: N/A  Psychiatric Specialty Exam: Physical Exam  Review of Systems  Blood pressure 110/81, pulse 86, temperature 99.1 F (37.3 C), temperature source Oral, resp. rate 18, height 5\' 5"  (1.651 m), weight 88.5 kg, last menstrual period 04/15/2019, SpO2 98 %.Body mass  index is 32.47 kg/m.  General Appearance: Casual  Eye Contact:  Good  Speech:  Clear and Coherent  Volume:  Normal  Mood:  Dysphoric  Affect:  Congruent  Thought Process:  Goal Directed  Orientation:  Full (Time, Place, and Person)  Thought Content:  Logical  Suicidal Thoughts:  No  Homicidal Thoughts:  No  Memory:  Immediate;   Fair Recent;   Fair intact but vague  Judgement:  Intact  Insight:  Fair  Psychomotor Activity:  Normal  Concentration:  Concentration: Good and Attention Span: Fair  Recall:  [employer] of Knowledge:  Fair  Language:  Fair  Akathisia:  Negative  Handed:  Right  AIMS (if indicated):     Assets:  Physical Health Resilience Social Support  ADL's:  Intact  Cognition:  WNL  Sleep:  Number of Hours: 8.25     Treatment Plan Summary: Daily contact with patient to assess and evaluate symptoms and progress in treatment and Medication management continue to monitor for safety add prazosin for nightmares no change in precautions  Andrea Robinson, MD 04/15/2019, 12:26 PM

## 2019-04-15 NOTE — Progress Notes (Signed)
Recreation Therapy Notes  Date: 3.10.21 Time: 1000 Location: [address]   Group Topic: Communication, Team Building, Problem Solving  Goal Area(s) Addresses:  Patient will effectively work with peer towards shared goal.  Patient will identify skill used to make activity successful.  Patient will identify how skills used during activity can be used to reach post d/c goals.   Intervention: STEM Activity   Activity: [employer]. Patients were provided the following materials: 5 drinking straws, 5 rubber bands, 5 paper clips, 2 index cards and 2 drinking cups. Using the provided materials patients were asked to build a launching mechanisms to launch a ping pong ball approximately 12 feet. Patients were divided into teams of 3-5.   Education: Pharmacist, community, Building control surveyor.   Education Outcome: Acknowledges education/In group clarification offered/Needs additional education.   Clinical Observations/Feedback:  Pt did not attend group.     Caroll Rancher, LRT/CTRS         Caroll Rancher A 04/15/2019 11:23 AM

## 2019-04-15 NOTE — Progress Notes (Signed)
   04/14/19 2025  Psych Admission Type (Psych Patients Only)  Admission Status Involuntary  Psychosocial Assessment  Patient Complaints Anxiety;Worrying  Eye Contact Fair  Facial Expression Anxious;Pensive;Sullen;Sad  Affect Anxious;Sad;Fearful  Speech Logical/coherent  Interaction Assertive  Motor Activity Slow  Appearance/Hygiene Unremarkable  Behavior Characteristics Cooperative;Anxious  Mood Anxious;Fearful  Thought Process  Coherency Circumstantial  Content WDL  Delusions Paranoid  Perception WDL  Hallucination None reported or observed  Judgment Poor  Confusion None  Danger to Self  Current suicidal ideation? Denies  Danger to Others  Danger to Others None reported or observed   Pt seems very anxious and fearful when seen at her room door. Pt states, "Can I go to the other unit? I am scared here. Why am I over here?" Pt denies SI, HI, AVH. Pt asked about medication compliance and states, "I was taking medicine but stopped because I was taking too many pills. I didn't want to take a lot of pills." Pt advised to speak to provider about alternatives to daily pills for her Risperdal. Pt given Vistaril for anxiety d/t feeling like she wasn't safe on [address].

## 2019-04-15 NOTE — Progress Notes (Signed)
Patient ID: Andrea Pugh, female   DOB: 1995/09/25, 24 y.o.   MRN: [id number] Patient is seen and examined.  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  The patient had a fall this morning which was most likely secondary to orthostasis.  It was a witnessed fall and she did hit her head.  She complains of a headache currently, and given the fall there is concern for possible trauma.  She will be sent to imaging for a CT scan of the head without contrast to assess possible trauma.  Her blood pressure was mildly elevated early this a.m. at 140/113, but is 110/81 right now.  Her pulse is 86.  She has a low-grade fever at 99.1.  Her white blood cell count on admission was 8.1.  Her urine had rare bacteria, but 0-5 white blood cells.  Pregnancy test was negative.  COVID-19 is negative.  Drug screen was negative.

## 2019-04-15 NOTE — Tx Team (Signed)
Interdisciplinary Treatment and Diagnostic Plan Update  04/15/2019 Time of Session: 9:00am Jaskirat Zertuche MRN: [id number]  Principal Diagnosis: <principal problem not specified>  Secondary Diagnoses: Active Problems:   Psychotic disorder (HCC)   Bipolar disorder (HCC)   Current Medications:  Current Facility-Administered Medications  Medication Dose Route Frequency Provider Last Rate Last Admin  . acetaminophen (TYLENOL) tablet 650 mg  650 mg Oral Q6H PRN Rankin, Shuvon B, NP   650 mg at 04/14/19 1854  . benztropine (COGENTIN) tablet 0.5 mg  0.5 mg Oral BID Malvin Johns, MD   0.5 mg at 04/15/19 0809  . escitalopram (LEXAPRO) tablet 10 mg  10 mg Oral Daily Rankin, Shuvon B, NP   10 mg at 04/15/19 0809  . hydrOXYzine (ATARAX/VISTARIL) tablet 25 mg  25 mg Oral TID PRN Rankin, Shuvon B, NP   25 mg at 04/14/19 2026  . meclizine (ANTIVERT) tablet 12.5 mg  12.5 mg Oral BID Rankin, Shuvon B, NP   12.5 mg at 04/15/19 0810  . omega-3 acid ethyl esters (LOVAZA) capsule 1 g  1 g Oral BID Malvin Johns, MD      . risperiDONE (RISPERDAL) tablet 1 mg  1 mg Oral q AM Malvin Johns, MD   1 mg at 04/15/19 0809  . risperiDONE (RISPERDAL) tablet 3 mg  3 mg Oral QHS Malvin Johns, MD   3 mg at 04/14/19 2026  . traZODone (DESYREL) tablet 150 mg  150 mg Oral QHS Malvin Johns, MD   150 mg at 04/14/19 2026   PTA Medications: Medications Prior to Admission  Medication Sig Dispense Refill Last Dose  . escitalopram (LEXAPRO) 10 MG tablet Take 1 tablet (10 mg total) by mouth daily. 30 tablet 0   . hydrOXYzine (ATARAX/VISTARIL) 25 MG tablet Take 1 tablet (25 mg total) by mouth 3 (three) times daily as needed for anxiety. 30 tablet 0   . meclizine (ANTIVERT) 12.5 MG tablet Take 1 tablet (12.5 mg total) by mouth 2 (two) times daily. 60 tablet 0   . risperiDONE (RISPERDAL) 0.5 MG tablet Take one tablet (0.5 mg) by mouth every morning and three tablets (1.5 mg) by mouth at bedtime. 120 tablet 0   . traZODone (DESYREL)  50 MG tablet Take 0.5 tablets (25 mg total) by mouth at bedtime as needed for sleep. 15 tablet 0     Patient Stressors: Health problems Medication change or noncompliance Traumatic event  Patient Strengths: Ability for insight Communication skills Motivation for treatment/growth Supportive family/friends  Treatment Modalities: Medication Management, Group therapy, Case management,  1 to 1 session with clinician, Psychoeducation, Recreational therapy.   Physician Treatment Plan for Primary Diagnosis: <principal problem not specified> Long Term Goal(s): Improvement in symptoms so as ready for discharge Improvement in symptoms so as ready for discharge   Short Term Goals: Ability to verbalize feelings will improve Ability to disclose and discuss suicidal ideas Ability to demonstrate self-control will improve Ability to identify and develop effective coping behaviors will improve Ability to demonstrate self-control will improve Ability to identify and develop effective coping behaviors will improve Ability to identify triggers associated with substance abuse/mental health issues will improve  Medication Management: Evaluate patient's response, side effects, and tolerance of medication regimen.  Therapeutic Interventions: 1 to 1 sessions, Unit Group sessions and Medication administration.  Evaluation of Outcomes: Progressing  Physician Treatment Plan for Secondary Diagnosis: Active Problems:   Psychotic disorder (HCC)   Bipolar disorder (HCC)  Long Term Goal(s): Improvement in symptoms so as ready for  discharge Improvement in symptoms so as ready for discharge   Short Term Goals: Ability to verbalize feelings will improve Ability to disclose and discuss suicidal ideas Ability to demonstrate self-control will improve Ability to identify and develop effective coping behaviors will improve Ability to demonstrate self-control will improve Ability to identify and develop effective  coping behaviors will improve Ability to identify triggers associated with substance abuse/mental health issues will improve     Medication Management: Evaluate patient's response, side effects, and tolerance of medication regimen.  Therapeutic Interventions: 1 to 1 sessions, Unit Group sessions and Medication administration.  Evaluation of Outcomes: Progressing   RN Treatment Plan for Primary Diagnosis: <principal problem not specified> Long Term Goal(s): Knowledge of disease and therapeutic regimen to maintain health will improve  Short Term Goals: Ability to verbalize feelings will improve, Ability to identify and develop effective coping behaviors will improve and Compliance with prescribed medications will improve  Medication Management: RN will administer medications as ordered by provider, will assess and evaluate patient's response and provide education to patient for prescribed medication. RN will report any adverse and/or side effects to prescribing provider.  Therapeutic Interventions: 1 on 1 counseling sessions, Psychoeducation, Medication administration, Evaluate responses to treatment, Monitor vital signs and CBGs as ordered, Perform/monitor CIWA, COWS, AIMS and Fall Risk screenings as ordered, Perform wound care treatments as ordered.  Evaluation of Outcomes: Progressing   LCSW Treatment Plan for Primary Diagnosis: <principal problem not specified> Long Term Goal(s): Safe transition to appropriate next level of care at discharge, Engage patient in therapeutic group addressing interpersonal concerns.  Short Term Goals: Engage patient in aftercare planning with referrals and resources, Increase social support, Identify triggers associated with mental health/substance abuse issues and Increase skills for wellness and recovery  Therapeutic Interventions: Assess for all discharge needs, 1 to 1 time with Social worker, Explore available resources and support systems, Assess for  adequacy in community support network, Educate family and significant other(s) on suicide prevention, Complete Psychosocial Assessment, Interpersonal group therapy.  Evaluation of Outcomes: Progressing  Progress in Treatment: Attending groups: Yes. Participating in groups: Yes. Taking medication as prescribed: Yes. Toleration medication: Yes. Family/Significant other contact made: No, will contact:  SPE reviewed with patient. Patient understands diagnosis: Yes. Discussing patient identified problems/goals with staff: Yes. Medical problems stabilized or resolved: No. Denies suicidal/homicidal ideation: Yes. Issues/concerns per patient self-inventory: Yes.  New problem(s) identified: Yes, Describe:  limited supports, no insurance  New Short Term/Long Term Goal(s): medication management for mood stabilization; elimination of SI thoughts; development of comprehensive mental wellness/sobriety plan.  Patient Goals: "Resolve stress."  Discharge Plan or Barriers: Patient agreeable to be referred for ACTT services.   Reason for Continuation of Hospitalization: Anxiety Delusions  Depression Hallucinations Medication stabilization  Estimated Length of Stay: 5-7 days  Attendees: Patient: Andrea Pugh  04/15/2019 9:56 AM  Physician: Dr.Farah 04/15/2019 9:56 AM  Nursing:  04/15/2019 9:56 AM  RN Care Manager: 04/15/2019 9:56 AM  Social Worker: Stephanie Acre, [place] 04/15/2019 9:56 AM  Recreational Therapist:  04/15/2019 9:56 AM  Other:  04/15/2019 9:56 AM  Other:  04/15/2019 9:56 AM  Other: 04/15/2019 9:56 AM    Scribe for Treatment Team: Joellen Jersey, [place] 04/15/2019 9:56 AM
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
## 2019-04-15 NOTE — [facility] Group Notes (Signed)
LCSW Aftercare Discharge Planning Group Note  04/15/2019   Type of Group and Topic: Psychoeducational Group: Discharge Planning  Participation Level: Did Not Attend  Description of Group  Discharge planning group reviews patient's anticipated discharge plans and assists patients to anticipate and address any barriers to wellness/recovery in the community. Suicide prevention education is reviewed with patients in group.  Therapeutic Goals  1. Patients will state their anticipated discharge plan and mental health aftercare  2. Patients will identify potential barriers to wellness in the community setting  3. Patients will engage in problem solving, solution focused discussion of ways to anticipate and address barriers to wellness/recovery  Summary of Patient Progress  Plan for Discharge/Comments:  Transportation Means:  Supports:  Therapeutic Modalities:  Motivational Interviewing  Meya Clutter C Numa Heatwole, LCSWA  04/15/2019 1:46 PM 

## 2019-04-16 MED ORDER — ZOLPIDEM TARTRATE 5 MG PO TABS
10.0000 mg | ORAL_TABLET | Freq: Every day | ORAL | Status: DC
  Administered 2019-04-16: 21:00:00 10 mg via ORAL
  Filled 2019-04-16: qty 2

## 2019-04-16 NOTE — Progress Notes (Signed)
Recreation Therapy Notes  INPATIENT RECREATION THERAPY ASSESSMENT  Patient Details Name: Andrea Pugh MRN: [id number] DOB: 1995/07/18 Today's Date: 04/16/2019       Information Obtained From: Patient  Able to Participate in Assessment/Interview: Yes  Patient Presentation: Alert  Reason for Admission (Per Patient): Other (Comments)(Pt stated emotional overload)  Patient Stressors: Other (Comment)(Life in general, trying to figure out stuff)  Coping Skills:   Isolation, Journal, Arguments, Music, Exercise, Deep Breathing, Talk, Art, Avoidance, Read, Dance, Hot Bath/Shower, Other (Comment)(Internet)  Leisure Interests (2+):  Individual - Reading, Art - Paint, Art - Draw, Music - Other (Comment), Sports - Exercise (Comment)(Dance, Jumping Jacks, Crunches)  Frequency of Recreation/Participation: Weekly  Awareness of Community Resources:  Yes  Community Resources:  Park, Other (Comment), Mall([address])  Current Use: Yes  If no, Barriers?:    Expressed Interest in [employer] Information: No  [place] of Residence:  [place]  Patient Main Form of Transportation: Car  Patient Strengths:  Being creative; Critical thinking  Patient Identified Areas of Improvement:  Control emotions; Stop focusing on stuff that doesn't involve her  Patient Goal for Hospitalization:  "get better to have strong mental mindset"  Current SI (including self-harm):  No  Current HI:  No  Current AVH: No  Staff Intervention Plan: Group Attendance, Collaborate with Interdisciplinary Treatment Team  Consent to Intern Participation: N/A    Caroll Rancher, LRT/CTRS  Caroll Rancher A 04/16/2019, 2:21 PM

## 2019-04-16 NOTE — Progress Notes (Signed)
Recreation Therapy Notes  Date: 3.11.21 Time: 1000 Location: [address]  Group Topic: Self-Esteem  Goal Area(s) Addresses:  Patient will successfully identify positive attributes about themselves.  Patient will successfully identify benefit of improved self-esteem.   Behavioral Response: Engaged  Intervention: Worksheet, pencils  Activity: My Education officer, environmental.  Patients were given a worksheet divided into 8 categories (good at, compliments, appearance, overcome, helped others, unique, value most and make others happy).  For each categorie, patients were to identify 3 strengths/qualities.  Education:  Self-Esteem, Dentist.   Education Outcome: Acknowledges education/In group clarification offered/Needs additional education  Clinical Observations/Feedback: LRT met with patient 1:1 for activity.  Pt identified things she was good at as being creative and being tech savy;compliments received were beautiful hair, smile, cute freckles; things she likes about her appearance are freckles, figure and eyes; overcame school and trauma; helped others by giving ideas, building furniture and carrying things; what makes her unique is "being smart in my own way" and "how strange I can get"; values family, gratitude for small things and being able to live most; made others happy by being silly and jokes.  Pt expressed activity was difficult because it was hard to find the qualities in her self but stated "it's easier to see the good in others".  Pt went on to say "you know what you like (music, magazines, etc) so you automatically go to it without thinking about it".     Victorino Sparrow, LRT/CTRS     Victorino Sparrow A 04/16/2019 10:51 AM
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
## 2019-04-16 NOTE — [facility] Suicide Risk Assessment (Signed)
[facility] INPATIENT:  Family/Significant Other Suicide Prevention Education  Suicide Prevention Education:  Education Completed; aunt, Annabell Sabal [phone] has been identified by the patient as the family member/significant other with whom the patient will be residing, and identified as the person(s) who will aid the patient in the event of a mental health crisis (suicidal ideations/suicide attempt).  With written consent from the patient, the family member/significant other has been provided the following suicide prevention education, prior to the and/or following the discharge of the patient.  The suicide prevention education provided includes the following:  Suicide risk factors  Suicide prevention and interventions  National Suicide Hotline telephone number  [facility] assessment telephone number  [facility] Emergency Assistance 911  [facility] and/or Residential Mobile Crisis Unit telephone number  Request made of family/significant other to:  Remove weapons (e.g., guns, rifles, knives), all items previously/currently identified as safety concern.    Remove drugs/medications (over-the-counter, prescriptions, illicit drugs), all items previously/currently identified as a safety concern.  The family member/significant other verbalizes understanding of the suicide prevention education information provided.  The family member/significant other agrees to remove the items of safety concern listed above.  Aunt was not aware that patient was inpatient or of patient's recent [facility] admission. Aunt shared that patient lives in [place] with no family nearby and she worries about the patient. Aunt reports she has asked the patient to move home or stay with family, even temporarily, but so far the patient has declined.  Aunt asked but supportive services for the patient, CSW shared that patient was agreeable to an ACTT referral.  No other questions or concerns at this  time for CSW.  Darreld Mclean 04/16/2019, 1:52 PM

## 2019-04-17 MED ORDER — OMEGA-3-ACID ETHYL ESTERS 1 G PO CAPS: 1.0000 g | ORAL_CAPSULE | Freq: Two times a day (BID) | ORAL | 2 refills | Status: DC

## 2019-04-17 MED ORDER — BENZTROPINE MESYLATE 0.5 MG PO TABS: 0.5000 mg | ORAL_TABLET | Freq: Two times a day (BID) | ORAL | 2 refills | Status: DC

## 2019-04-17 MED ORDER — RISPERIDONE 2 MG PO TABS
2.0000 mg | ORAL_TABLET | Freq: Every day | ORAL | Status: DC
  Filled 2019-04-17 (×2): qty 7

## 2019-04-17 MED ORDER — ESCITALOPRAM OXALATE 20 MG PO TABS: 20.0000 mg | ORAL_TABLET | Freq: Every day | ORAL | Status: DC

## 2019-04-17 MED ORDER — ESCITALOPRAM OXALATE 20 MG PO TABS: 20.0000 mg | ORAL_TABLET | Freq: Every day | ORAL | 1 refills | Status: DC

## 2019-04-17 MED ORDER — ZOLPIDEM TARTRATE 10 MG PO TABS: 10.0000 mg | ORAL_TABLET | Freq: Every day | ORAL | 1 refills | Status: DC

## 2019-04-17 MED ORDER — PRAZOSIN HCL 2 MG PO CAPS: 4.0000 mg | ORAL_CAPSULE | Freq: Every day | ORAL | 2 refills | Status: DC

## 2019-04-17 MED ORDER — RISPERIDONE 2 MG PO TABS: 2.0000 mg | ORAL_TABLET | Freq: Every day | ORAL | 1 refills | Status: DC

## 2019-04-17 MED ORDER — MECLIZINE HCL 12.5 MG PO TABS: 12.5000 mg | ORAL_TABLET | Freq: Two times a day (BID) | ORAL | 0 refills | Status: DC

## 2019-04-17 NOTE — [facility] Group Notes (Signed)
LCSW Group Therapy Note  04/17/2019 11:28 AM  Type of Therapy/Topic:  Group Therapy:  Balance in Life  Participation Level:  Active  Description of Group:    This group will address the concept of balance and how it feels and looks when one is unbalanced. Patients will be encouraged to process areas in their lives that are out of balance and identify reasons for remaining unbalanced. Facilitators will guide patients in utilizing problem-solving interventions to address and correct the stressor making their life unbalanced. Understanding and applying boundaries will be explored and addressed for obtaining and maintaining a balanced life. Patients will be encouraged to explore ways to assertively make their unbalanced needs known to significant others in their lives, using other group members and facilitator for support and feedback.  Therapeutic Goals: 1. Patient will identify two or more emotions or situations they have that consume much of in their lives. 2. Patient will identify signs/triggers that life has become out of balance:  3. Patient will identify two ways to set boundaries in order to achieve balance in their lives:  4. Patient will demonstrate ability to communicate their needs through discussion and/or role plays  Summary of Patient Progress: Pt was appropriate and respectful in group. Pt was able to identify the different areas in her life that she feels she is doing well in and the areas she can make some improvements. Pt reported that she mostly feels balanced in life, but think she needs to spend more time on herself and get more help like an ACT or CST team. Pt then started discussing her discharge plan and how she can remain balanced with her plan in place.     Therapeutic Modalities:   Cognitive Behavioral Therapy Solution-Focused Therapy Assertiveness Training  Iris Pert, MSW, LCSW Clinical Social Work 04/17/2019 11:28 AM

## 2019-04-17 NOTE — Progress Notes (Signed)
Recreation Therapy Notes  INPATIENT RECREATION TR PLAN  Patient Details Name: Andrea Pugh MRN: [id number] DOB: 03/10/1995 Today's Date: 04/17/2019  Rec Therapy Plan Is patient appropriate for Therapeutic Recreation?: Yes Treatment times per week: about 3 days Estimated Length of Stay: 5-7 days TR Treatment/Interventions: Group participation (Comment)  Discharge Criteria Pt will be discharged from therapy if:: Discharged Treatment plan/goals/alternatives discussed and agreed upon by:: Patient/family  Discharge Summary Short term goals set: See patient care plan Short term goals met: Adequate for discharge Progress toward goals comments: Groups attended Which groups?: Self-esteem Reason goals not met: Pt attended one group session. Therapeutic equipment acquired: N/A Reason patient discharged from therapy: Discharge from hospital Pt/family agrees with progress & goals achieved: Yes Date patient discharged from therapy: 04/17/19     , LRT/CTRS  ,  A 04/17/2019, 1:24 PM  

## 2019-04-17 NOTE — Progress Notes (Signed)
Recreation Therapy Notes  Date: 3.12.21 Time: 1010 Location: [address] Dayroom  Group Topic: Triggers  Goal Area(s) Addresses:  Patient will identify triggers. Patient will identify how to deal with triggers. Patient will identify benefit of dealing with triggers post d/c.  Intervention:  Worksheet, pencils   Activity: Triggers.  Patients were to identify their biggest triggers, how they avoid/reduce exposure to triggers and how they deal with triggers head on.  Education: Triggers, Discharge Planning  Education Outcome: Acknowledges understanding/In group clarification offered/Needs additional education.   Clinical Observations/Feedback: Pt did not attend group session.    Andrea Pugh, LRT/CTRS         Andrea Pugh 04/17/2019 11:23 AM 
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
## 2019-04-17 NOTE — Progress Notes (Signed)
D: Pt A & O X 3. Denies SI, HI, AVH and pain at this time. D/C home as ordered. Picked up in front of facility by [employer]. Reports she slept well last night with good appetite and "my mood is good right now". A: D/C instructions reviewed with pt including prescriptions, medication samples and follow up appointments; compliance encouraged. All belongings from assigned locker given to pt at time of departure. All scheduled medications administered with verbal education and effects monitored. Safety checks maintained without incident till time of d/c.  R: Pt receptive to care. Compliant with medications when offered. Denies adverse drug reactions when assessed. Verbalized understanding related to d/c instructions. Signed belonging sheet in agreement with items received from assigned locker. Ambulatory with a steady gait. Appears to be in no physical distress at time of departure.

## 2019-04-17 NOTE — Progress Notes (Signed)
  [facility] Adult Case Management Discharge Plan :  Will you be returning to the same living situation after discharge:  Yes,  home At discharge, do you have transportation home?: Yes,  safe transport Do you have the ability to pay for your medications: Yes,  mental health  Release of information consent forms completed and in the chart;    Patient to Follow up at: Follow-up Information    [facility] Follow up on 04/21/2019.   Why: You are scheduled for an appointment on 04/21/19 at 9:15 am.  This will be a virtual tele-health appointment.  Contact information: [address] [place] [address] [phone]        [facility]. Go on 05/05/2019.   Why: You have an assessment scheduled for 05/05/19 at 12:00pm for ACT services. It will be in the office. Please take photo id, any insurance information, and discharge paperwork with you to your appointment. Thank You! Contact information: [address] [place], [place] [address]  TEL: [phone] FAX: [phone]          Next level of care provider has access to [facility] Link:no  Safety Planning and Suicide Prevention discussed: Yes,  SPE completed with pts aunt     Has patient been referred to the Quitline?: Patient refused referral  Patient has been referred for addiction treatment: N/A  Mechele Dawley, LCSW 04/17/2019, 11:51 AM

## 2019-04-17 NOTE — Discharge Summary (Signed)
Physician Discharge Summary Note  Patient:  Andrea Pugh is an 24 y.o., female  MRN:  [id number]  DOB:  [birth year]  Patient phone:  [phone] (home)   Patient address:   [address] [place] [address],   Total Time spent with patient: 15 minutes  Date of Admission:  04/13/2019  Date of Discharge: 04/17/19  Reason for Admission: Worsening symptoms of bipolar disorder.  Principal Problem: Bipolar disorder [facility])  Discharge Diagnoses: Principal Problem:   Bipolar disorder (HCC) Active Problems:   Psychotic disorder (HCC)   Past Psychiatric History: Anxiety disorder, Major depressive disorder. Two prior hospitalizations in June 2019.                                              Self-injurious behaviors (cutting) years ago.  Past Medical History:  Past Medical History:  Diagnosis Date  . Anxiety   . Depression    History reviewed. No pertinent surgical history.  Family History: History reviewed. No pertinent family history.  Family Psychiatric  History: See H&P  Social History:  Social History   Substance and Sexual Activity  Alcohol Use Yes   Comment: occasional     Social History   Substance and Sexual Activity  Drug Use Yes  . Types: Marijuana    Social History   Socioeconomic History  . Marital status: Single    Spouse name: Not on file  . Number of children: Not on file  . Years of education: Not on file  . Highest education level: Not on file  Occupational History  . Occupation: Consulting civil engineer  Tobacco Use  . Smoking status: Never Smoker  . Smokeless tobacco: Never Used  Substance and Sexual Activity  . Alcohol use: Yes    Comment: occasional  . Drug use: Yes    Types: Marijuana  . Sexual activity: Not Currently  Other Topics Concern  . Not on file  Social History Narrative   Pt currently lives in student housing at [employer].  She is currently contemplating continuing her education.  She also works at a [employer].  Pt  currently followed by [facility].   Social Determinants of Health   Financial Resource Strain:   . Difficulty of Paying Living Expenses:   Food Insecurity:   . Worried About Programme researcher, broadcasting/film/video in the Last Year:   . Barista in the Last Year:   Transportation Needs:   . Freight forwarder (Medical):   Marland Kitchen Lack of Transportation (Non-Medical):   Physical Activity:   . Days of Exercise per Week:   . Minutes of Exercise per Session:   Stress:   . Feeling of Stress :   Social Connections:   . Frequency of Communication with Friends and Family:   . Frequency of Social Gatherings with Friends and Family:   . Attends Religious Services:   . Active Member of Clubs or Organizations:   . Attends Banker Meetings:   Marland Kitchen Marital Status:    Hospital Course: (Per Md's admission evaluation): This is a repeat admission for [name], she is 24 years of age and she presented voluntarily on 04/11/19, once again on 04/13/19, however she had recently been discharged from our inpatient facility on 04/06/19.  Though she presented 5 days after her initial discharge complaining of auditory hallucinations, uncertainty as to her medication dosing  and regimen, and confusion.  Therefore, she left the facility prior to the completion of the assessment, only to re-present on 04/13/19. As discussed her chief complaints revolve around feeling confused and "not knowing what is going on" she describes herself is driving recklessly because voices were telling her which direction to go. She describes herself is obsessing but cannot elaborate on what that involves. Patient is employed she works in [employer] she does not abuse drugs.  She has been diagnosed with depression but states she is not particularly depressed at this point time.  This is one of several psychiatric discharge summaries for this 24 year old AA female with hx of chronic mental illness (Bipolar disorder) & multiple psychiatric admissions. She is  known in this [facility] for worsening symptoms of her mental illness. She has been tried on multiple psychotropic medications for her symptoms & it appears nothing has actually been helpful in stabilizing her symptoms or it could be that she may not have been compliant with her treatment regimen. She was brought to the [facility] this time around for evaluation & treatment of her worsening symptoms. Both her toxicology test & UDS showed negative results.  After evaluation of her presenting symptoms as noted on the admission notes above, Teylor was recommended for mood stabilization treatments. The medication regimen for her presenting symptoms were discussed & with her consent initiated. She received, stabilized & was discharged on the medications as listed below on her discharge medication lists. She was also enrolled & participated in the group counseling sessions being offered & held on this unit. She learned coping skills. She presented on this admission, other chronic medical conditions that required treatment & monitoring. She was treated/discharged on all her pertinent home medications for those health issues. She tolerated her treatment regimen without any adverse effects or reactions reported.  Francia's symptoms responded well to her treatment regimen.This is evidenced by her reports of improved mood & symptoms. And during the course of her hospitalization, the 15-minute checks were adequate to ensure her safety.  Patient did not display any dangerous, violent or suicidal behavior on the unit.  She interacted with patients & staff appropriately, participated appropriately in the group sessions/therapies. Her medications were addressed & adjusted to meet her needs. She was recommended for outpatient follow-up care & medication management upon discharge to assure her continuity of care.  At the time of discharge patient is not reporting any acute suicidal/homicidal ideations. She feels more confident about her  self-care & in managing the suicidal thoughts. She currently denies any new issues or concerns. Education and supportive counseling provided throughout her hospital stay & upon discharge.  Today upon her discharge evaluation with the attending psychiatrist, Chiquitta shares she is doing well. She denies any other specific concerns. She is sleeping well. Her appetite is good. She denies other physical complaints. She denies AH/VH. She feels that her medications have been helpful & is in agreement to continue her current treatment regimen as recommended. She was able to engage in safety planning including plan to return to [facility] or contact emergency services if she feels unable to maintain her own safety or the safety of others. Pt had no further questions, comments, or concerns. She left [facility] with all personal belongings in no apparent distress. Transportation per the [employer]..  Physical Findings: AIMS: Facial and Oral Movements Muscles of Facial Expression: None, normal Lips and Perioral Area: None, normal Jaw: None, normal Tongue: None, normal,Extremity Movements Upper (arms, wrists, hands,  fingers): None, normal Lower (legs, knees, ankles, toes): None, normal, Trunk Movements Neck, shoulders, hips: None, normal, Overall Severity Severity of abnormal movements (highest score from questions above): None, normal Incapacitation due to abnormal movements: None, normal Patient's awareness of abnormal movements (rate only patient's report): No Awareness, Dental Status Current problems with teeth and/or dentures?: No Does patient usually wear dentures?: No  CIWA:  CIWA-Ar Total: 0 COWS:  COWS Total Score: 1  Musculoskeletal: Strength & Muscle Tone: within normal limits Gait & Station: normal Patient leans: N/A  Psychiatric Specialty Exam: Physical Exam  Nursing note and vitals reviewed. Constitutional: She is oriented to person, place, and time. She appears well-developed and  well-nourished.  Cardiovascular: Normal rate.  Respiratory: Effort normal.  Genitourinary:    Genitourinary Comments: Deferred   Musculoskeletal:        General: Normal range of motion.     Cervical back: Normal range of motion.  Neurological: She is alert and oriented to person, place, and time.  Skin: Skin is warm and dry.    Review of Systems  Constitutional: Negative.  Negative for chills, diaphoresis and fever.  HENT: Negative for congestion, rhinorrhea, sneezing and sore throat.   Respiratory: Negative for cough, shortness of breath and wheezing.   Cardiovascular: Negative for chest pain and palpitations.  Gastrointestinal: Negative for diarrhea, nausea and vomiting.  Genitourinary: Negative for difficulty urinating.  Allergic/Immunologic: Negative for environmental allergies and food allergies.       NKDA  Neurological: Negative for dizziness.  Psychiatric/Behavioral: Positive for dysphoric mood (Stabilized with medication prior to discharge), hallucinations (Hx. Psychosis (Stabilized with medication prior to discharge)) and sleep disturbance (Stabilized with medication prior to discharge). Negative for agitation, behavioral problems, self-injury and suicidal ideas. The patient is not nervous/anxious (Stable) and is not hyperactive.     Blood prssure 119/75. Heart rate 95. Respirations 20. Temperature 98.2. O2 sat 100%.  See MD's discharge SRA   Has this patient used any form of tobacco in the last 30 days? (Cigarettes, Smokeless Tobacco, Cigars, and/or Pipes)  No  Blood Alcohol level:  Lab Results  Component Value Date   ETH <10 04/13/2019   ETH <10 04/02/2019    Metabolic Disorder Labs:  Lab Results  Component Value Date   HGBA1C 5.6 04/02/2019   MPG 114.02 04/02/2019   Lab Results  Component Value Date   PROLACTIN 66.1 (H) 04/13/2019   Lab Results  Component Value Date   CHOL 167 04/02/2019   TRIG 44 04/02/2019   HDL 45 04/02/2019   CHOLHDL 3.7 04/02/2019    VLDL 9 04/02/2019   LDLCALC 113 (H) 04/02/2019   See Psychiatric Specialty Exam and Suicide Risk Assessment completed by Attending Physician prior to discharge.  Discharge destination:  Home  Is patient on multiple antipsychotic therapies at discharge:  No   Has Patient had three or more failed trials of antipsychotic monotherapy by history:  No  Recommended Plan for Multiple Antipsychotic Therapies: NA  Allergies as of 04/17/2019   No Known Allergies     Medication List    STOP taking these medications   hydrOXYzine 25 MG tablet Commonly known as: ATARAX/VISTARIL   traZODone 50 MG tablet Commonly known as: DESYREL     TAKE these medications     Indication  benztropine 0.5 MG tablet Commonly known as: COGENTIN Take 1 tablet (0.5 mg total) by mouth 2 (two) times daily.  Indication: Extrapyramidal Reaction caused by Medications   escitalopram 20 MG tablet Commonly known  asJudye Bos Take 1 tablet (20 mg total) by mouth daily. What changed:   medication strength  how much to take  Indication: Major Depressive Disorder   meclizine 12.5 MG tablet Commonly known as: ANTIVERT Take 1 tablet (12.5 mg total) by mouth 2 (two) times daily.  Indication: Sensation of Spinning or Whirling   omega-3 acid ethyl esters 1 g capsule Commonly known as: LOVAZA Take 1 capsule (1 g total) by mouth 2 (two) times daily.  Indication: High Amount of Triglycerides in the Blood   prazosin 2 MG capsule Commonly known as: MINIPRESS Take 2 capsules (4 mg total) by mouth at bedtime.  Indication: Frightening Dreams   risperiDONE 2 MG tablet Commonly known as: RISPERDAL Take 1 tablet (2 mg total) by mouth at bedtime. What changed:   medication strength  how much to take  how to take this  when to take this  additional instructions  Indication: Hypomanic Episode of Bipolar Disorder   zolpidem 10 MG tablet Commonly known as: AMBIEN Take 1 tablet (10 mg total) by mouth at  bedtime.  Indication: Trouble Sleeping      Follow-up Information    [facility] Follow up on 04/21/2019.   Why: You are scheduled for an appointment on 04/21/19 at 9:15 am.  This will be a virtual tele-health appointment.  Contact information: [address] [place] [address] [phone]        [facility] Follow up.   Why: A referral has been placed on your behalf.  Please contact this provider for services. Contact information: [address] [place], [place] [address]  TEL: [phone] FAX: [phone]         Follow-up recommendations: Activity:  As tolerated Diet: As recommended by your primary care doctor. Keep all scheduled follow-up appointments as recommended.  Comments: Prescriptions given at discharge.  Patient agreeable to plan.  Given opportunity to ask questions.  Appears to feel comfortable with discharge denies any current suicidal or homicidal thought. Patient is also instructed prior to discharge to: Take all medications as prescribed by his/her mental healthcare provider. Report any adverse effects and or reactions from the medicines to his/her outpatient provider promptly. Patient has been instructed & cautioned: To not engage in alcohol and or illegal drug use while on prescription medicines. In the event of worsening symptoms, patient is instructed to call the crisis hotline, 911 and or go to the nearest ED for appropriate evaluation and treatment of symptoms. To follow-up with his/her primary care provider for your other medical issues, concerns and or health care needs.  Signed: Armandina Stammer, NP, PMHNP, FNP-BC 04/17/2019, 9:18 AM

## 2019-04-17 NOTE — Plan of Care (Signed)
Patient engaged in groups with minimal prompting at completion of recreation therapy group sessions.    Caroll Rancher, LRT/CTRS

## 2019-04-20 ENCOUNTER — Emergency Department (HOSPITAL_COMMUNITY)
Admission: EM | Admit: 2019-04-20 | Discharge: 2019-04-20 | Disposition: A | Discharge status: Home / Self Care | Payer: Medicaid Other | Attending: Emergency Medicine | Admitting: Emergency Medicine

## 2019-04-20 ENCOUNTER — Encounter (HOSPITAL_COMMUNITY): Payer: Self-pay

## 2019-04-20 ENCOUNTER — Emergency Department (HOSPITAL_COMMUNITY)
Admission: EM | Admit: 2019-04-20 | Discharge: 2019-04-21 | Disposition: A | Discharge status: Home / Self Care | Payer: Medicaid Other | Attending: Emergency Medicine | Admitting: Emergency Medicine

## 2019-04-20 ENCOUNTER — Other Ambulatory Visit: Payer: Self-pay

## 2019-04-20 DIAGNOSIS — Z79899 Other long term (current) drug therapy: Secondary | ICD-10-CM | POA: Insufficient documentation

## 2019-04-20 DIAGNOSIS — F310 Bipolar disorder, current episode hypomanic: Secondary | ICD-10-CM

## 2019-04-20 DIAGNOSIS — F419 Anxiety disorder, unspecified: Secondary | ICD-10-CM

## 2019-04-20 DIAGNOSIS — F333 Major depressive disorder, recurrent, severe with psychotic symptoms: Secondary | ICD-10-CM | POA: Diagnosis present

## 2019-04-20 DIAGNOSIS — Z20822 Contact with and (suspected) exposure to covid-19: Secondary | ICD-10-CM | POA: Insufficient documentation

## 2019-04-20 DIAGNOSIS — F319 Bipolar disorder, unspecified: Secondary | ICD-10-CM | POA: Insufficient documentation

## 2019-04-20 LAB — RAPID URINE DRUG SCREEN, HOSP PERFORMED
Amphetamines: NOT DETECTED
Barbiturates: NOT DETECTED
Benzodiazepines: NOT DETECTED
Cocaine: NOT DETECTED
Opiates: NOT DETECTED
Tetrahydrocannabinol: NOT DETECTED

## 2019-04-20 LAB — ETHANOL
Alcohol, Ethyl (B): <10 mg/dL (ref <10)
Alcohol, Ethyl (B): <10 mg/dL (ref <10)

## 2019-04-20 LAB — COMPREHENSIVE METABOLIC PANEL
ALT: 15 U/L (ref 0–44)
AST: 16 U/L (ref 15–41)
Albumin: 4.3 g/dL (ref 3.5–5.0)
Alkaline Phosphatase: 62 U/L (ref 38–126)
Anion gap: 11 (ref 5–15)
BUN: 8 mg/dL (ref 6–20)
CO2: 24 mmol/L (ref 22–32)
Calcium: 9.3 mg/dL (ref 8.9–10.3)
Chloride: 98 mmol/L (ref 98–111)
Creatinine, Ser: 0.67 mg/dL (ref 0.44–1.00)
GFR calc Af Amer: >60 mL/min (ref >60)
GFR calc non Af Amer: >60 mL/min (ref >60)
Glucose, Bld: 113 mg/dL — ABNORMAL HIGH (ref 70–99)
Potassium: 3.8 mmol/L (ref 3.5–5.1)
Sodium: 133 mmol/L — ABNORMAL LOW (ref 135–145)
Total Bilirubin: 1.2 mg/dL (ref 0.3–1.2)
Total Protein: 8.1 g/dL (ref 6.5–8.1)

## 2019-04-20 LAB — CBC WITH DIFFERENTIAL/PLATELET
Abs Immature Granulocytes: 0.02 10*3/uL (ref 0.00–0.07)
Basophils Absolute: 0 10*3/uL (ref 0.0–0.1)
Basophils Relative: 0 %
Eosinophils Absolute: 0 10*3/uL (ref 0.0–0.5)
Eosinophils Relative: 0 %
HCT: 38.8 % (ref 36.0–46.0)
Hemoglobin: 13.3 g/dL (ref 12.0–15.0)
Immature Granulocytes: 0 %
Lymphocytes Relative: 22 %
Lymphs Abs: 1.8 10*3/uL (ref 0.7–4.0)
MCH: 31.7 pg (ref 26.0–34.0)
MCHC: 34.3 g/dL (ref 30.0–36.0)
MCV: 92.4 fL (ref 80.0–100.0)
Monocytes Absolute: 0.6 10*3/uL (ref 0.1–1.0)
Monocytes Relative: 8 %
Neutro Abs: 5.6 10*3/uL (ref 1.7–7.7)
Neutrophils Relative %: 70 %
Platelets: 373 10*3/uL (ref 150–400)
RBC: 4.2 MIL/uL (ref 3.87–5.11)
RDW: 12.9 % (ref 11.5–15.5)
WBC: 8 10*3/uL (ref 4.0–10.5)
nRBC: 0 % (ref 0.0–0.2)

## 2019-04-20 LAB — I-STAT BETA HCG BLOOD, ED (MC, WL, AP ONLY)
I-stat hCG, quantitative: <5 m[IU]/mL (ref <5)
I-stat hCG, quantitative: <5 m[IU]/mL (ref <5)

## 2019-04-20 LAB — RESPIRATORY PANEL BY RT PCR (FLU A&B, COVID)
Influenza A by PCR: NEGATIVE
Influenza B by PCR: NEGATIVE
SARS Coronavirus 2 by RT PCR: NEGATIVE

## 2019-04-20 NOTE — ED Provider Notes (Signed)
[facility]-EMERGENCY DEPT Provider Note   CSN: [id number] Arrival date & time: 04/20/19  1016     History Chief Complaint  Patient presents with  . Hallucinations    Larenda Reedy is a 24 y.o. female.  HPI 25 year old female history of bipolar disorder presents today with reports that she was confused loss and voices were talking to her.  Patient was seen earlier today in the ED at which time she was seen by behavioral health and not felt to meet inpatient psychiatric criteria.  They recommended follow-up tomorrow at 316.  Patient was discharged this am and now represents with co hearing voices, recurrent hallucinations, confusion, and lost.    Past Medical History:  Diagnosis Date  . Anxiety   . Depression     Patient Active Problem List   Diagnosis Date Noted  . Bipolar disorder (HCC) 04/14/2019  . Psychotic disorder (HCC) 04/13/2019  . Depression, major, recurrent, severe with psychosis (HCC)   . Psychosis (HCC) 04/03/2019  . MDD (major depressive disorder), recurrent episode, severe (HCC) 04/01/2019  . Major depressive disorder, recurrent severe without psychotic features (HCC) 07/31/2017    No past surgical history on file.   OB History   No obstetric history on file.     No family history on file.  Social History   Tobacco Use  . Smoking status: Never Smoker  . Smokeless tobacco: Never Used  Substance Use Topics  . Alcohol use: Yes    Comment: occasional  . Drug use: Yes    Types: Marijuana    Home Medications Prior to Admission medications   Medication Sig Start Date End Date Taking? Authorizing Provider  benztropine (COGENTIN) 0.5 MG tablet Take 1 tablet (0.5 mg total) by mouth 2 (two) times daily. 04/17/19   Malvin Johns, MD  escitalopram (LEXAPRO) 20 MG tablet Take 1 tablet (20 mg total) by mouth daily. 04/17/19   Malvin Johns, MD  meclizine (ANTIVERT) 12.5 MG tablet Take 1 tablet (12.5 mg total) by mouth 2 (two) times  daily. 04/17/19   Malvin Johns, MD  omega-3 acid ethyl esters (LOVAZA) 1 g capsule Take 1 capsule (1 g total) by mouth 2 (two) times daily. 04/17/19   Malvin Johns, MD  prazosin (MINIPRESS) 2 MG capsule Take 2 capsules (4 mg total) by mouth at bedtime. 04/17/19   Malvin Johns, MD  risperiDONE (RISPERDAL) 2 MG tablet Take 1 tablet (2 mg total) by mouth at bedtime. 04/17/19   Malvin Johns, MD  zolpidem (AMBIEN) 10 MG tablet Take 1 tablet (10 mg total) by mouth at bedtime. 04/17/19   Malvin Johns, MD    Allergies    Patient has no known allergies.  Review of Systems   Review of Systems  Physical Exam Updated Vital Signs BP 115/83   Pulse 93   Temp 98.2 F (36.8 C) (Oral)   Resp 16   LMP 04/15/2019 (Approximate)   SpO2 100%   Physical Exam Vitals reviewed.  Constitutional:      General: She is not in acute distress.    Appearance: She is normal weight. She is not ill-appearing.  HENT:     Head: Normocephalic.     Right Ear: External ear normal.     Left Ear: External ear normal.     Nose: Nose normal.     Mouth/Throat:     Mouth: Mucous membranes are moist.  Eyes:     Extraocular Movements: Extraocular movements intact.     Pupils: Pupils  are equal, round, and reactive to light.  Cardiovascular:     Rate and Rhythm: Normal rate and regular rhythm.     Pulses: Normal pulses.  Pulmonary:     Effort: Pulmonary effort is normal.  Abdominal:     General: Bowel sounds are normal.     Palpations: Abdomen is soft.     Tenderness: There is no abdominal tenderness.  Musculoskeletal:     Cervical back: Normal range of motion.  Skin:    General: Skin is warm.     Capillary Refill: Capillary refill takes less than 2 seconds.  Neurological:     General: No focal deficit present.     Mental Status: She is alert.     Cranial Nerves: No cranial nerve deficit.     Sensory: No sensory deficit.     Motor: No weakness.  Psychiatric:        Attention and Perception: She is inattentive.         Mood and Affect: Affect is flat.        Speech: She is noncommunicative. Speech is delayed.        Behavior: Behavior normal.        Cognition and Memory: Cognition is impaired.     ED Results / Procedures / Treatments   Labs (all labs ordered are listed, but only abnormal results are displayed) Labs Reviewed - No data to display Labs done earlier same day reviewed and normal EKG None  Radiology No results found.  Procedures Procedures (including critical care time)  Medications Ordered in ED Medications - No data to display  ED Course  I have reviewed the triage vital signs and the nursing notes.  Pertinent labs & imaging results that were available during my care of the patient were reviewed by me and considered in my medical decision making (see chart for details).    MDM Rules/Calculators/A&P                      Patient confused, slowed with psychosis. Plan psych evaluation Final Clinical Impression(s) / ED Diagnoses Final diagnoses:  Bipolar affective disorder, current episode hypomanic [facility])    Rx / DC Orders ED Discharge Orders    None       Pattricia Boss, MD 04/20/19 1246

## 2019-04-20 NOTE — Discharge Instructions (Addendum)
Please follow-up at [facility] on March 16 at 9:15 AM.

## 2019-04-20 NOTE — ED Notes (Signed)
Patient continues to take BP and cords off.   Patient given water, crackers, and sandwich. Food tray ordered.   Patient comfortable at this time and denies any needs.

## 2019-04-20 NOTE — Progress Notes (Signed)
Received Andrea Pugh awake in her bed watching TV with the sitter at the bedside. She slept throughout the night.

## 2019-04-20 NOTE — ED Notes (Signed)
Pt requesting help with a ride home. Taxi services called for her. She states that she has a card to pay with. She is agreeable to taking a taxi.

## 2019-04-20 NOTE — ED Notes (Signed)
TTS consult in progress. °
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
## 2019-04-20 NOTE — ED Notes (Addendum)
Pt requesting water in triage because she feels lightheaded, informed pt to wait until seen by a provider before drinking anything.

## 2019-04-20 NOTE — ED Notes (Signed)
During standing BP pt states that she is feeling very dizzy and needs to sit down. BP at 3 minute mark not obtained due to same
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
## 2019-04-20 NOTE — ED Triage Notes (Addendum)
Patient arrived via [facility] from [facility].   Policed called ems.  Patient was told voices told her to go to [employer]. Patient drove her car there per ems. Patient is confused and "lost"   C/O recurrent hallucinations   Patient discharged this morning for same. Patient reports taking her medications at 5am this morning after picking up prescription from [facility] long out patient pharmacy.    Previous blood work from [facility] in chart.   Please see TTS notes from earlier today.  Patient denies SI or HI Patient denies chest pain   Patient ambulated A/oX4  EMS: BP-138/90 HR-122 99% RA 97.8 temp   Pt has phone keys, cell phone, and wallet.  Patient changed into gown.
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
## 2019-04-21 ENCOUNTER — Encounter: Payer: Self-pay | Admitting: General Practice

## 2019-04-21 ENCOUNTER — Encounter (HOSPITAL_COMMUNITY): Payer: Self-pay | Admitting: Registered Nurse

## 2019-04-21 DIAGNOSIS — F333 Major depressive disorder, recurrent, severe with psychotic symptoms: Secondary | ICD-10-CM

## 2019-04-21 MED ORDER — OMEGA-3-ACID ETHYL ESTERS 1 G PO CAPS: 1.0000 g | ORAL_CAPSULE | Freq: Two times a day (BID) | ORAL | 0 refills | Status: DC

## 2019-04-21 MED ORDER — PRAZOSIN HCL 2 MG PO CAPS: 4.0000 mg | ORAL_CAPSULE | Freq: Every day | ORAL | 0 refills | Status: DC

## 2019-04-21 MED ORDER — ZOLPIDEM TARTRATE 10 MG PO TABS: 5.0000 mg | ORAL_TABLET | Freq: Every day | ORAL | 0 refills | Status: DC

## 2019-04-21 MED ORDER — ESCITALOPRAM OXALATE 20 MG PO TABS: 20.0000 mg | ORAL_TABLET | Freq: Every day | ORAL | 0 refills | Status: DC

## 2019-04-21 MED ORDER — MECLIZINE HCL 12.5 MG PO TABS: 12.5000 mg | ORAL_TABLET | Freq: Two times a day (BID) | ORAL | 0 refills | Status: DC

## 2019-04-21 MED ORDER — BENZTROPINE MESYLATE 0.5 MG PO TABS: 0.5000 mg | ORAL_TABLET | Freq: Two times a day (BID) | ORAL | 0 refills | Status: DC

## 2019-04-21 MED ORDER — ZOLPIDEM TARTRATE 10 MG PO TABS: 5.0000 mg | ORAL_TABLET | Freq: Every day | ORAL | 1 refills | Status: DC

## 2019-04-21 MED ORDER — RISPERIDONE 2 MG PO TABS: 2.0000 mg | ORAL_TABLET | Freq: Every day | ORAL | 0 refills | Status: DC

## 2019-04-21 NOTE — BH Assessment (Signed)
[facility] Assessment Progress Note  Per Shuvon Rankin, FNP, this pt does not require psychiatric hospitalization at this time.  Pt is to be discharged from [facility] with recommendation to follow up with an outpatient provider.  Pt reportedly intends to return to her family's household in [place], [place].  Pt has been referred to [facility] in [place], who will reportedly call her in the next few days to schedule an intake appointment.  This has been included in pt's discharge instructions, along with contact information for the provider.  Pt's nurse, Kendal Hymen, has been notified.  Doylene Canning, MA Triage Specialist [phone]

## 2019-04-21 NOTE — Progress Notes (Signed)
TOC CM spoke to pt's Aunt, Annabell Sabal. She is requesting a PCP in [place]. Appt arranged for [facility] for 05/11/2019 at 1:30 pm. Pt will have a $120 copay. She does not have insurance. Called Aunt to give information on appt time and location. Isidoro Donning RN CCM, [facility] ED TOC CM [phone]

## 2019-04-21 NOTE — ED Notes (Signed)
Phone and car key sent with cousin Terese Door per patients request.

## 2019-04-21 NOTE — Discharge Instructions (Signed)
For your behavioral health needs, you are advised to follow up with an outpatient provider.  You have been referred to [facility] in [place], [place].  You should receive a call from them to schedule an intake appointment in the next few days.  If you do not, or if you have any questions, call them at the number listed below:       [facility]      [address]      [place], [place] [address]      [phone]
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
## 2019-04-22 NOTE — Progress Notes (Addendum)
04/22/2019 1:02 pm TOC CM received call from Kathleen Argue and states they did not receive any written Rx or they were not called into pharmacy. States pt does not have Medicaid or insurance. She plans to apply for her to get Medicaid, but will need assistance with her medications. Provided Aunt with information on her appts, contact info and time. Isidoro Donning RN CCM, [facility] ED TOC [place] [phone]  04/22/2019 3:20 pm Medications were escribed to pharmacy by NP. Contacted [facility] and her medication total is $120 with goodrx coupons, contacted Aunt and left HIPAA complaint voice message for a return call. Isidoro Donning RN CCM, [facility] ED TOC CM [phone]

## 2019-05-18 ENCOUNTER — Ambulatory Visit: Payer: Medicaid Other | Admitting: Family Medicine

## 2020-03-07 ENCOUNTER — Encounter (HOSPITAL_COMMUNITY): Payer: Self-pay

## 2020-03-07 ENCOUNTER — Ambulatory Visit (HOSPITAL_COMMUNITY)
Admission: EM | Admit: 2020-03-07 | Discharge: 2020-03-08 | Disposition: A | Discharge status: Other Acute Inpatient Hospital | Payer: No Payment, Other | Attending: Psychiatry | Admitting: Psychiatry

## 2020-03-07 ENCOUNTER — Other Ambulatory Visit: Payer: Self-pay

## 2020-03-07 DIAGNOSIS — F312 Bipolar disorder, current episode manic severe with psychotic features: Secondary | ICD-10-CM | POA: Diagnosis not present

## 2020-03-07 DIAGNOSIS — Z20822 Contact with and (suspected) exposure to covid-19: Secondary | ICD-10-CM | POA: Diagnosis not present

## 2020-03-07 DIAGNOSIS — F22 Delusional disorders: Secondary | ICD-10-CM

## 2020-03-07 DIAGNOSIS — F3110 Bipolar disorder, current episode manic without psychotic features, unspecified: Secondary | ICD-10-CM

## 2020-03-07 DIAGNOSIS — F29 Unspecified psychosis not due to a substance or known physiological condition: Secondary | ICD-10-CM

## 2020-03-07 LAB — CBC WITH DIFFERENTIAL/PLATELET
Abs Immature Granulocytes: 0.04 10*3/uL (ref 0.00–0.07)
Basophils Absolute: 0 10*3/uL (ref 0.0–0.1)
Basophils Relative: 0 %
Eosinophils Absolute: 0 10*3/uL (ref 0.0–0.5)
Eosinophils Relative: 0 %
HCT: 37.8 % (ref 36.0–46.0)
Hemoglobin: 12.9 g/dL (ref 12.0–15.0)
Immature Granulocytes: 1 %
Lymphocytes Relative: 24 %
Lymphs Abs: 2 10*3/uL (ref 0.7–4.0)
MCH: 30.6 pg (ref 26.0–34.0)
MCHC: 34.1 g/dL (ref 30.0–36.0)
MCV: 89.8 fL (ref 80.0–100.0)
Monocytes Absolute: 0.6 10*3/uL (ref 0.1–1.0)
Monocytes Relative: 7 %
Neutro Abs: 5.6 10*3/uL (ref 1.7–7.7)
Neutrophils Relative %: 68 %
Platelets: 422 10*3/uL — ABNORMAL HIGH (ref 150–400)
RBC: 4.21 MIL/uL (ref 3.87–5.11)
RDW: 12.4 % (ref 11.5–15.5)
WBC: 8.3 10*3/uL (ref 4.0–10.5)
nRBC: 0 % (ref 0.0–0.2)

## 2020-03-07 LAB — LIPID PANEL
Cholesterol: 187 mg/dL (ref 0–200)
HDL: 58 mg/dL (ref >40)
LDL Cholesterol: 119 mg/dL — ABNORMAL HIGH (ref 0–99)
Total CHOL/HDL Ratio: 3.2 RATIO
Triglycerides: 49 mg/dL (ref <150)
VLDL: 10 mg/dL (ref 0–40)

## 2020-03-07 LAB — ETHANOL: Alcohol, Ethyl (B): <10 mg/dL (ref <10)

## 2020-03-07 LAB — COMPREHENSIVE METABOLIC PANEL
ALT: 29 U/L (ref 0–44)
AST: 24 U/L (ref 15–41)
Albumin: 4.4 g/dL (ref 3.5–5.0)
Alkaline Phosphatase: 72 U/L (ref 38–126)
Anion gap: 13 (ref 5–15)
BUN: 10 mg/dL (ref 6–20)
CO2: 24 mmol/L (ref 22–32)
Calcium: 9.6 mg/dL (ref 8.9–10.3)
Chloride: 101 mmol/L (ref 98–111)
Creatinine, Ser: 0.74 mg/dL (ref 0.44–1.00)
GFR, Estimated: >60 mL/min (ref >60)
Glucose, Bld: 85 mg/dL (ref 70–99)
Potassium: 3.8 mmol/L (ref 3.5–5.1)
Sodium: 138 mmol/L (ref 135–145)
Total Bilirubin: 1.7 mg/dL — ABNORMAL HIGH (ref 0.3–1.2)
Total Protein: 7.6 g/dL (ref 6.5–8.1)

## 2020-03-07 LAB — RESP PANEL BY RT-PCR (FLU A&B, COVID) ARPGX2
Influenza A by PCR: NEGATIVE
Influenza B by PCR: NEGATIVE
SARS Coronavirus 2 by RT PCR: NEGATIVE

## 2020-03-07 LAB — TSH: TSH: 0.914 u[IU]/mL (ref 0.350–4.500)

## 2020-03-07 LAB — HEMOGLOBIN A1C
Hgb A1c MFr Bld: 6 % — ABNORMAL HIGH (ref 4.8–5.6)
Mean Plasma Glucose: 125.5 mg/dL

## 2020-03-07 LAB — MAGNESIUM: Magnesium: 2.1 mg/dL (ref 1.7–2.4)

## 2020-03-07 LAB — POC SARS CORONAVIRUS 2 AG -  ED: SARS Coronavirus 2 Ag: NEGATIVE

## 2020-03-07 LAB — POC SARS CORONAVIRUS 2 AG: SARS Coronavirus 2 Ag: NEGATIVE
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
## 2020-03-07 MED ORDER — TRAZODONE HCL 50 MG PO TABS
50.0000 mg | ORAL_TABLET | Freq: Every evening | ORAL | Status: DC | PRN
  Filled 2020-03-07: qty 1

## 2020-03-07 MED ORDER — HALOPERIDOL LACTATE 5 MG/ML IJ SOLN: 5.0000 mg | Freq: Once | INTRAMUSCULAR | Status: DC | PRN

## 2020-03-07 MED ORDER — PRAZOSIN HCL 2 MG PO CAPS
4.0000 mg | ORAL_CAPSULE | Freq: Every day | ORAL | Status: DC
  Administered 2020-03-07: 4 mg via ORAL
  Filled 2020-03-07: qty 2

## 2020-03-07 MED ORDER — OMEGA-3-ACID ETHYL ESTERS 1 G PO CAPS
1.0000 g | ORAL_CAPSULE | Freq: Two times a day (BID) | ORAL | Status: DC
  Filled 2020-03-07 (×2): qty 1

## 2020-03-07 MED ORDER — ACETAMINOPHEN 325 MG PO TABS: 650.0000 mg | ORAL_TABLET | Freq: Four times a day (QID) | ORAL | Status: DC | PRN

## 2020-03-07 MED ORDER — ESCITALOPRAM OXALATE 10 MG PO TABS
20.0000 mg | ORAL_TABLET | Freq: Every day | ORAL | Status: DC
  Administered 2020-03-08: 20 mg via ORAL
  Filled 2020-03-07 (×2): qty 2

## 2020-03-07 MED ORDER — ALUM & MAG HYDROXIDE-SIMETH 200-200-20 MG/5ML PO SUSP: 30.0000 mL | ORAL | Status: DC | PRN

## 2020-03-07 MED ORDER — BENZTROPINE MESYLATE 0.5 MG PO TABS
0.5000 mg | ORAL_TABLET | Freq: Two times a day (BID) | ORAL | Status: DC
  Administered 2020-03-07 – 2020-03-08 (×2): 0.5 mg via ORAL
  Filled 2020-03-07 (×2): qty 1

## 2020-03-07 MED ORDER — RISPERIDONE 2 MG PO TABS
2.0000 mg | ORAL_TABLET | Freq: Every day | ORAL | Status: DC
  Administered 2020-03-07: 2 mg via ORAL
  Filled 2020-03-07: qty 1

## 2020-03-07 MED ORDER — LORAZEPAM 2 MG/ML IJ SOLN: 2.0000 mg | Freq: Once | INTRAMUSCULAR | Status: DC | PRN

## 2020-03-07 MED ORDER — MAGNESIUM HYDROXIDE 400 MG/5ML PO SUSP: 30.0000 mL | Freq: Every day | ORAL | Status: DC | PRN

## 2020-03-07 NOTE — BH Assessment (Signed)
Comprehensive Clinical Assessment (CCA) Note  03/07/2020 Andrea Pugh [id number] Patient presents to [facility] by [employer] with IVC. Per IVC respondent is said to be experiencing delusional thought content believing that somebody has been shot and she was found by [employer] to be driving around in her car looking for that individual. She said she was going to her [place] but provided a address that does not exist. Respondent appears to be disorganized and does not appear to be oriented to place or situation. On arrival at [facility] patient contnues to be disorganized although reports that it is January 2022. Patient states "she is in a mental place" when asked in reference to her current location. Patient is observed to be exhibiting some thought blocking and renders limited history. Patient does not seem to process the content of this writer's questions at times and has delayed responses. Patient denies any S/I, H/I or AVH. Patient does report she receives OP services from "[facility] by phone" although cannot recall her medication regimen or the last time she met with that provider. Patient when asked in reference to her mental health diagnosis states "anxiety." Patient denies any SA history. Patient states she currently resides with a roommate and denies the content of the IVC stating, "I don't think I did that." Due to current AMS information to complete assessment was obtained from chart review.   Per history patient was last seen on 04/20/19 when she presented to [facility] with similar symptoms. Per that note Devoria Glassing [facility] writes: Andrea Pugh is an 25 y.o. single female who presents unaccompanied to [facility] ED reporting that she believes her roommates poisoned her food. She says she didn't see them put anything in her food but believes she overheard them talking about doing so. Pt has a diagnosis of bipolar disorder and a history of persecutory delusions. She was discharged from [facility] 04/17/19 had has been assessed  by TTS several times recently. She says she feels lightheaded and feels "sedated." She describes her mood as anxious and acknowledges symptoms including crying spells, social withdrawal, fatigue, irritability, decreased concentration, decreased sleep, and decreased appetite. She denies current suicidal ideation or history of suicide attempts. Protective factors against suicide include good family support, future orientation, therapeutic relationship, no access to firearms, spiritual convictions and no prior attempts.  She denies current homicidal ideation or history of aggression. She denies current auditory or visual hallucinations. She denies alcohol or other substances use.  See detailed discharge summary note from [facility] NP 04/17/19.  Patient is observed to be disorganized this date with recent memory impaired. She is   oriented x3 and speaks in a  soft tone, at moderate volume and normal pace. Patient displays active thought blocking and is difficult to redirect. Eye contact is minimal. Patient's mood is anxious and affect is congruent with mood. There is no indication from patient's behavior that she is currently responding to internal stimuli. Case was staffed with [facility] NP who recommends a inpatient admission for stabilization.    Chief Complaint:  Chief Complaint  Patient presents with  . Delusional   Visit Diagnosis: Unspecified psychosis     CCA Screening, Triage and Referral (STR)  Patient Reported Information How did you hear about [place]? -- (Pt brought in by law enforcement)  Referral name: Pt brought in by [employer] with IVC  Referral phone number: No data recorded  Whom do you see for routine medical problems? I don't have a doctor  Practice/Facility Name: No data recorded Practice/Facility Phone Number:  No data recorded Name of Contact: No data recorded Contact Number: No data recorded Contact Fax Number: No data recorded Prescriber Name: No data recorded Prescriber Address  (if known): No data recorded  What Is the Reason for Your Visit/Call Today? No data recorded How Long Has This Been Causing You Problems? No data recorded What Do You Feel Would Help You the Most Today? -- (UTA)   Have You Recently Been in Any Inpatient Treatment (Hospital/Detox/Crisis Center/28-Day Program)? No  Name/Location of Program/Hospital:No data recorded How Long Were You There? No data recorded When Were You Discharged? No data recorded  Have You Ever Received Services From [facility] Before? Yes  Who Do You See at [facility]? Pt has been seen multiple times presenting with similar symptoms   Have You Recently Had Any Thoughts About Hurting Yourself? No  Are You Planning to Commit Suicide/Harm Yourself At This time? No   Have you Recently Had Thoughts About [place]? No  Explanation: No data recorded  Have You Used Any Alcohol or Drugs in the Past 24 Hours? No  How Long Ago Did You Use Drugs or Alcohol? No data recorded What Did You Use and How Much? No data recorded  Do You Currently Have a Therapist/Psychiatrist? No  Name of Therapist/Psychiatrist: No data recorded  Have You Been Recently Discharged From Any Office Practice or Programs? No  Explanation of Discharge From Practice/Program: No data recorded    CCA Screening Triage Referral Assessment Type of Contact: Face-to-Face  Is this Initial or Reassessment? No data recorded Date Telepsych consult ordered in [facility]:  No data recorded Time Telepsych consult ordered in [facility]:  No data recorded  Patient Reported Information Reviewed? Yes  Patient Left Without Being Seen? No data recorded Reason for Not Completing Assessment: No data recorded  Collateral Involvement: No data recorded  Does Patient Have a [place]? No data recorded Name and Contact of Legal Guardian: Self  If Minor and Not Living with Parent(s), Who has Custody? NA  Is CPS involved or ever been  involved? Never  Is APS involved or ever been involved? Never   Patient Determined To Be At Risk for Harm To Self or Others Based on Review of Patient Reported Information or Presenting Complaint? No  Method: No data recorded Availability of Means: No data recorded Intent: No data recorded Notification Required: No data recorded Additional Information for Danger to Others Potential: No data recorded Additional Comments for Danger to Others Potential: No data recorded Are There Guns or Other Weapons in Your Home? No data recorded Types of Guns/Weapons: No data recorded Are These Weapons Safely Secured?                            No data recorded Who Could Verify You Are Able To Have These Secured: No data recorded Do You Have any Outstanding Charges, Pending Court Dates, Parole/Probation? No data recorded Contacted To Inform of Risk of Harm To Self or Others: -- (NA)   Location of Assessment: [facility] Assessment Services   Does Patient Present under Involuntary Commitment? Yes  IVC Papers Initial File Date: 03/07/2020   [place] of Residence: [place]   Patient Currently Receiving the Following Services: -- (UTA)   Determination of Need: -- (To be determined)   Options For Referral: -- (To be determined)     CCA Biopsychosocial Intake/Chief Complaint:  Pt presents with IVC  Current Symptoms/Problems: Ongoing  thought blocking   Patient Reported Schizophrenia/Schizoaffective Diagnosis in Past: No   Strengths: No data recorded Preferences: No data recorded Abilities: No data recorded  Type of Services Patient Feels are Needed: No data recorded  Initial Clinical Notes/Concerns: No data recorded  Mental Health Symptoms Depression:  -- (UTA)   Duration of Depressive symptoms: No data recorded  Mania:  None   Anxiety:   Difficulty concentrating   Psychosis:  Delusions   Duration of Psychotic symptoms: Less than six months   Trauma:  None   Obsessions:   None   Compulsions:  None   Inattention:  None   Hyperactivity/Impulsivity:  N/A   Oppositional/Defiant Behaviors:  None   Emotional Irregularity:  None   Other Mood/Personality Symptoms:  No data recorded   Mental Status Exam Appearance and self-care  Stature:  Average   Weight:  Obese   Clothing:  Disheveled   Grooming:  Neglected   Cosmetic use:  No data recorded  Posture/gait:  Bizarre   Motor activity:  Restless   Sensorium  Attention:  Confused   Concentration:  Anxiety interferes   Orientation:  X5   Recall/memory:  Defective in Immediate   Affect and Mood  Affect:  Anxious   Mood:  Anxious   Relating  Eye contact:  Fleeting   Facial expression:  Constricted   Attitude toward examiner:  Cooperative   Thought and Language  Speech flow: Flight of Ideas   Thought content:  Suspicious   Preoccupation:  -- (NA)   Hallucinations:  None   Organization:  No data recorded  Transport planner of Knowledge:  Poor   Intelligence:  Average   Abstraction:  Abstract   Judgement:  Impaired   Reality Testing:  Distorted   Insight:  Poor   Decision Making:  Confused   Social Functioning  Social Maturity:  Responsible   Social Judgement:  Normal   Stress  Stressors:  -- Special educational needs teacher)   Coping Ability:  Overwhelmed   Skill Deficits:  Decision making   Supports:  Family     Religion: Religion/Spirituality Are You A Religious Person?: No  Leisure/Recreation: Leisure / Recreation Do You Have Hobbies?: No  Exercise/Diet: Exercise/Diet Do You Exercise?: No Have You Gained or Lost A Significant Amount of Weight in the Past Six Months?: No Do You Follow a Special Diet?: No Do You Have Any Trouble Sleeping?: No   CCA Employment/Education Employment/Work Situation: Employment / Work Situation Employment situation: Unemployed  Education:     CCA Family/Childhood History Family and Relationship History: Family  history Marital status: Single  Childhood History:  Childhood History Does patient have siblings?: No Did patient suffer any verbal/emotional/physical/sexual abuse as a child?: No Did patient suffer from severe childhood neglect?: No Has patient ever been sexually abused/assaulted/raped as an adolescent or adult?: No Was the patient ever a victim of a crime or a disaster?: No Witnessed domestic violence?: No Has patient been affected by domestic violence as an adult?: No  Child/Adolescent Assessment:     CCA Substance Use Alcohol/Drug Use:                           ASAM's:  Six Dimensions of Multidimensional Assessment  Dimension 1:  Acute Intoxication and/or Withdrawal Potential:      Dimension 2:  Biomedical Conditions and Complications:      Dimension 3:  Emotional, Behavioral, or Cognitive Conditions and Complications:  Dimension 4:  Readiness to Change:     Dimension 5:  Relapse, Continued use, or Continued Problem Potential:     Dimension 6:  Recovery/Living Environment:     ASAM Severity Score:    ASAM Recommended Level of Treatment:     Substance use Disorder (SUD)    Recommendations for Services/Supports/Treatments:    DSM5 Diagnoses: Patient Active Problem List   Diagnosis Date Noted  . Bipolar disorder ([place]) 04/14/2019  . Psychotic disorder ([place]) 04/13/2019  . Depression, major, recurrent, severe with psychosis ([place])   . Psychosis ([place]) 04/03/2019  . MDD (major depressive disorder), recurrent episode, severe ([place]) 04/01/2019  . 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

## 2020-03-07 NOTE — ED Notes (Signed)
Pt admitted to continuous assessment for overnight observation due to delusional thought process. Denies SI/HI/AVS but thought blocking observed during assessment. Pt oriented to unit rules. Will monitor for safety.

## 2020-03-07 NOTE — ED Notes (Addendum)
Pt walked up to nurse's station stating "the spirits and my visitors are telling me I'm not supposed to be here." Thought blocking observed. Staff explained to pt that she will be staying at [facility] for observation and that she is safe and will be reassessed in the morning. RN offered PRN medication but pt refused. Will continue to monitor for safety.

## 2020-03-07 NOTE — ED Notes (Addendum)
Pt laying on bed then suddenly jumped up waving arms and blanket around. RN able to verbally redirect pt back to bed and provided ice water per her request. RN offered PRN medication but pt refused. Pt now calm and laying down on bed. Will continue to monitor for safety.

## 2020-03-07 NOTE — ED Notes (Signed)
Pt sitting up on bed, A&O x3, calm and cooperative. No signs of acute distress noted. Will continue to monitor for safety.

## 2020-03-07 NOTE — ED Provider Notes (Signed)
Behavioral Health Admission H&P [facility])  Date: 03/07/20 Patient Name: Andrea Pugh MRN: [id number] Chief Complaint:  Chief Complaint  Patient presents with  . Delusional   Chief Complaint/Presenting Problem: Pt presents with IVC  Diagnoses:  Final diagnoses:  Delusional disorder ([place])    HPI: Andrea Pugh, 25 y.o., female patient presents to [facility] as a walk in via police under IVC with complaints of bizarre behavior.  Patient driving around thinking someone has been shot and she is trying to find a person who has been shot.  Patient seen face to face by this provider, consulted with Dr. Serafina Mitchell; and chart reviewed on 03/07/20.  On evaluation Andrea Pugh reports she was brought in by the police.  Patient states that she was pulled over the by police because she was acting anxious.  Patient is unable to give information on what has been going on.  Patient states " I was [place].  I was driving my car.  I was anxious.  I was going to my uncle's house.  I do not remember much."  Patient states she lives with a roommate but unable to give the name of her roommate or where she lives. During evaluation Andrea Pugh is alert/oriented x 3; calm/cooperative.  Patient does appear to have auditory/visual hallucinations with delusional thinking.  Patient also presented with thought blocking, and disorganization.  Patient denies suicidal/homicidal ideation.  Patient also states that she has outpatient psychiatric services unable to tell who services are with and also states that she is taking psychotropic medications but is unsure what medications she is taking.  Recommending psychiatric inpatient admission.  PHQ 2-9:   [facility] from 03/07/2020 in [facility] Admission (Discharged) from OP Visit from 04/13/2019 in [place] [facility] Admission (Discharged) from 07/31/2017 in [place] [facility]   C-SSRS RISK CATEGORY No Risk No Risk Low Risk       Total Time spent with patient: 45 minutes  Musculoskeletal  Strength & Muscle Tone: within normal limits Gait & Station: normal Patient leans: N/A  Psychiatric Specialty Exam  Presentation General Appearance: Appropriate for Environment; Casual  Eye Contact:Good  Speech:Blocked; Clear and Coherent  Speech Volume:Decreased  Handedness:Right   Mood and Affect  Mood:Anxious; Depressed; Labile  Affect:Congruent   Thought Process  Thought Processes:Disorganized; Linear  Descriptions of Associations:Circumstantial  Orientation:Full (Time, Place and Person)  Thought Content:Delusions; Paranoid Ideation  Hallucinations:Hallucinations: Auditory; Tactile Description of Auditory Hallucinations: Patient unable to describe hallucinations but she is fanning at something, like an insect flying to close to face and she is swatting at it.  Ideas of Reference:Delusions  Suicidal Thoughts:Suicidal Thoughts: No  Homicidal Thoughts:Homicidal Thoughts: No   Sensorium  Memory:Immediate Fair; Recent Fair  Judgment:Fair  Insight:Fair   Executive Functions  Concentration:Fair  Attention Span:Good  [place]  Language:Good   Psychomotor Activity  Psychomotor Activity:Psychomotor Activity: Normal   Assets  Assets:Communication Skills; Desire for Improvement; Housing; Social Support   Sleep  Sleep:Sleep: Fair   Physical Exam Vitals and nursing note reviewed. Exam conducted with a chaperone present.  Constitutional:      General: She is not in acute distress.    Appearance: Normal appearance. She is not ill-appearing.  HENT:     Head: Normocephalic.  Eyes:     Pupils: Pupils are equal, round, and reactive to light.  Cardiovascular:     Rate and Rhythm: Normal rate.  Pulmonary:     Effort: Pulmonary effort is normal.  Musculoskeletal:        General: Normal range of motion.      Cervical back: Normal range of motion.  Skin:    General: Skin is warm and dry.  Neurological:     Mental Status: She is alert and oriented to person, place, and time.  Psychiatric:        Attention and Perception: She perceives auditory and visual hallucinations.        Mood and Affect: Mood is anxious. Affect is labile.        Speech: Speech is delayed.        Behavior: Behavior is cooperative.        Thought Content: Thought content is paranoid and delusional.        Cognition and Memory: Cognition normal.        Judgment: Judgment is impulsive.    Review of Systems  Constitutional: Negative.   HENT: Negative.   Eyes: Negative.   Respiratory: Negative.   Cardiovascular: Negative.   Gastrointestinal: Negative.   Genitourinary: Negative.   Musculoskeletal: Negative.   Skin: Negative.   Neurological: Negative.   Endo/Heme/Allergies: Negative.   Psychiatric/Behavioral: Positive for depression and hallucinations. Negative for suicidal ideas. The patient is nervous/anxious and has insomnia.     Blood pressure 99/62, pulse (!) 111, temperature 98.3 F (36.8 C), temperature source Oral, resp. rate 20, SpO2 100 %. There is no height or weight on file to calculate BMI.  Past Psychiatric History: Major depressive disorder with psychotic features, bipolar disorder  Is the patient at risk to self? Yes  Has the patient been a risk to self in the past 6 months? No .    Has the patient been a risk to self within the distant past? No   Is the patient a risk to others? No   Has the patient been a risk to others in the past 6 months? No   Has the patient been a risk to others within the distant past? No   Past Medical History:  Past Medical History:  Diagnosis Date  . Anxiety   . Depression    History reviewed. No pertinent surgical history.  Family History: History reviewed. No pertinent family history.  Social History:  Social History   Socioeconomic History  . Marital  status: Single    Spouse name: Not on file  . Number of children: Not on file  . Years of education: Not on file  . Highest education level: Not on file  Occupational History  . Occupation: Ship broker  Tobacco Use  . Smoking status: Never Smoker  . Smokeless tobacco: Never Used  Vaping Use  . Vaping Use: Never used  Substance and Sexual Activity  . Alcohol use: Yes    Comment: occasional  . Drug use: Yes    Types: Marijuana  . Sexual activity: Not Currently  Other Topics Concern  . Not on file  Social History Narrative   Pt currently lives in student housing at [employer].  She is currently contemplating continuing her education.  She also works at a [employer].  Pt currently followed by [facility].   Social Determinants of Health   Financial Resource Strain: Not on file  Food Insecurity: Not on file  Transportation Needs: Not on file  Physical Activity: Not on file  Stress: Not on file  Social Connections: Not on file  Intimate Partner Violence: Not on file  SDOH:  SDOH Screenings   Alcohol Screen: Low Risk   . Last Alcohol Screening Score (AUDIT): 3  Depression (PHQ2-9): Not on file  Financial Resource Strain: Not on file  Food Insecurity: Not on file  Housing: Not on file  Physical Activity: Not on file  Social Connections: Not on file  Stress: Not on file  Tobacco Use: Low Risk   . Smoking Tobacco Use: Never Smoker  . Smokeless Tobacco Use: Never Used  Transportation Needs: Not on file    Last Labs:  No visits with results within 6 Month(s) from this visit.  Latest known visit with results is:  Admission on 04/20/2019, Discharged on 04/21/2019  Component Date Value Ref Range Status  . Opiates 04/20/2019 NONE DETECTED  NONE DETECTED Final  . Cocaine 04/20/2019 NONE DETECTED  NONE DETECTED Final  . Benzodiazepines 04/20/2019 NONE DETECTED  NONE DETECTED Final  . Amphetamines 04/20/2019 NONE DETECTED  NONE DETECTED Final  . Tetrahydrocannabinol 04/20/2019  NONE DETECTED  NONE DETECTED Final  . Barbiturates 04/20/2019 NONE DETECTED  NONE DETECTED Final   Comment: (NOTE) DRUG SCREEN FOR MEDICAL PURPOSES ONLY.  IF CONFIRMATION IS NEEDED FOR ANY PURPOSE, NOTIFY LAB WITHIN 5 DAYS. LOWEST DETECTABLE LIMITS FOR URINE DRUG SCREEN Drug Class                     Cutoff (ng/mL) Amphetamine and metabolites    1000 Barbiturate and metabolites    200 Benzodiazepine                 200 Tricyclics and metabolites     300 Opiates and metabolites        300 Cocaine and metabolites        300 THC                            50 Performed at [facility], [address] [place], [place] [address]   . Alcohol, Ethyl (B) 04/20/2019 <10  <10 mg/dL Final   Comment: (NOTE) Lowest detectable limit for serum alcohol is 10 mg/dL. For medical purposes only. Performed at [facility], [address], [place], [place] [address]   . SARS Coronavirus 2 by RT PCR 04/20/2019 NEGATIVE  NEGATIVE Final   Comment: (NOTE) SARS-CoV-2 target nucleic acids are NOT DETECTED. The SARS-CoV-2 RNA is generally detectable in upper respiratoy specimens during the acute phase of infection. The lowest concentration of SARS-CoV-2 viral copies this assay can detect is 131 copies/mL. A negative result does not preclude SARS-Cov-2 infection and should not be used as the sole basis for treatment or other patient management decisions. A negative result may occur with  improper specimen collection/handling, submission of specimen other than nasopharyngeal swab, presence of viral mutation(s) within the areas targeted by this assay, and inadequate number of viral copies (<131 copies/mL). A negative result must be combined with clinical observations, patient history, and epidemiological information. The expected result is Negative. Fact Sheet for Patients:  https://www.moore.com/ Fact Sheet for Healthcare Providers:   https://www.young.biz/ This test is not yet ap                          proved or cleared by the [place] FDA and  has been authorized for detection and/or diagnosis of SARS-CoV-2 by FDA under an Emergency Use Authorization (EUA). This EUA will remain  in effect (meaning this test  can be used) for the duration of the COVID-19 declaration under Section 564(b)(1) of the Act, 21 U.S.C. section 360bbb-3(b)(1), unless the authorization is terminated or revoked sooner.   . Influenza A by PCR 04/20/2019 NEGATIVE  NEGATIVE Final  . Influenza B by PCR 04/20/2019 NEGATIVE  NEGATIVE Final   Comment: (NOTE) The Xpert Xpress SARS-CoV-2/FLU/RSV assay is intended as an aid in  the diagnosis of influenza from Nasopharyngeal swab specimens and  should not be used as a sole basis for treatment. Nasal washings and  aspirates are unacceptable for Xpert Xpress SARS-CoV-2/FLU/RSV  testing. Fact Sheet for Patients: PinkCheek.be Fact Sheet for Healthcare Providers: GravelBags.it This test is not yet approved or cleared by the [place] FDA and  has been authorized for detection and/or diagnosis of SARS-CoV-2 by  FDA under an Emergency Use Authorization (EUA). This EUA will remain  in effect (meaning this test can be used) for the duration of the  Covid-19 declaration under Section 564(b)(1) of the Act, 21  U.S.C. section 360bbb-3(b)(1), unless the authorization is  terminated or revoked. Performed at [facility], [place] [address] [place], [place] [address]   . I-stat hCG, quantitative 04/20/2019 <5.0  <5 mIU/mL Final  . Comment 3 04/20/2019          Final   Comment:   GEST. AGE      CONC.  (mIU/mL)   <=1 WEEK        5 - 50     2 WEEKS       50 - 500     3 WEEKS       100 - 10,000     4 WEEKS     1,000 - 30,000        FEMALE AND NON-PREGNANT FEMALE:     LESS THAN 5 mIU/mL     Allergies: Patient  has no known allergies.  PTA Medications: (Not in a hospital admission)   Medical Decision Making  Patient admitted to continuous assessment unit while awaiting appropriate bed for psychiatric admission  Lab Orders     Resp Panel by RT-PCR (Flu A&B, Covid) Nasopharyngeal Swab     CBC with Differential/Platelet     Comprehensive metabolic panel     Hemoglobin A1c     Ethanol     TSH     Lipid panel     Magnesium     Pregnancy, urine     Urinalysis, Routine w reflex microscopic Urine, Clean Catch     POC SARS Coronavirus 2 Ag-ED - Nasal Swab (BD Veritor Kit)     POCT Urine Drug Screen - (ICup)    Patient restarted on home medications Meds ordered this encounter  Medications  . acetaminophen (TYLENOL) tablet 650 mg  . alum & mag hydroxide-simeth (MAALOX/MYLANTA) 200-200-20 MG/5ML suspension 30 mL  . magnesium hydroxide (MILK OF MAGNESIA) suspension 30 mL  . benztropine (COGENTIN) tablet 0.5 mg  . escitalopram (LEXAPRO) tablet 20 mg  . omega-3 acid ethyl esters (LOVAZA) capsule 1 g  . prazosin (MINIPRESS) capsule 4 mg  . risperiDONE (RISPERDAL) tablet 2 mg  . traZODone (DESYREL) tablet 50 mg   Recommendations  Based on my evaluation the patient does not appear to have an emergency medical condition.  Kern Gingras, NP 03/07/20  6:42 PM

## 2020-03-07 NOTE — ED Notes (Signed)
Patient lying on pull out. Jumped up suddenly and said loudly 'it's the demons' with fearful demeanor. Staff verbally deescalated and given HS meds. Did attempt to refuse, staff encouraged patient to take meds that would help her manage her hallucinations & patient complied. Snack of cereal bar provided.

## 2020-03-07 NOTE — ED Triage Notes (Signed)
25 yo, accompanied by [employer]. [employer] officer states, "She was driving around asking people if anyone had been shot. We IVC'ed her because it is too dangerous for her to be driving around in this state". Pt presented with thought blocking when staff asked about the incident. Pt stated she broke her cellphone and don't know anybody number by heart. Officer stated pt gave them a address that was in [place]. Urgent need.

## 2020-03-08 ENCOUNTER — Other Ambulatory Visit: Payer: Self-pay | Admitting: Psychiatric/Mental Health

## 2020-03-08 ENCOUNTER — Encounter (HOSPITAL_COMMUNITY): Payer: Self-pay | Admitting: Registered Nurse

## 2020-03-08 ENCOUNTER — Inpatient Hospital Stay (HOSPITAL_COMMUNITY)
Admission: RE | Admit: 2020-03-08 | Discharge: 2020-03-18 | DRG: 885 | Disposition: A | Discharge status: Home / Self Care | Payer: Federal, State, Local not specified - Other | Source: Intra-hospital | Attending: Psychiatry | Admitting: Psychiatry

## 2020-03-08 DIAGNOSIS — D75839 Thrombocytosis, unspecified: Secondary | ICD-10-CM | POA: Diagnosis present

## 2020-03-08 DIAGNOSIS — F22 Delusional disorders: Secondary | ICD-10-CM | POA: Diagnosis present

## 2020-03-08 DIAGNOSIS — F202 Catatonic schizophrenia: Secondary | ICD-10-CM | POA: Diagnosis not present

## 2020-03-08 DIAGNOSIS — F419 Anxiety disorder, unspecified: Secondary | ICD-10-CM | POA: Diagnosis present

## 2020-03-08 DIAGNOSIS — F29 Unspecified psychosis not due to a substance or known physiological condition: Secondary | ICD-10-CM

## 2020-03-08 DIAGNOSIS — F200 Paranoid schizophrenia: Principal | ICD-10-CM | POA: Diagnosis present

## 2020-03-08 DIAGNOSIS — F339 Major depressive disorder, recurrent, unspecified: Secondary | ICD-10-CM | POA: Diagnosis present

## 2020-03-08 DIAGNOSIS — F312 Bipolar disorder, current episode manic severe with psychotic features: Secondary | ICD-10-CM | POA: Diagnosis not present

## 2020-03-08 DIAGNOSIS — F209 Schizophrenia, unspecified: Secondary | ICD-10-CM

## 2020-03-08 DIAGNOSIS — E119 Type 2 diabetes mellitus without complications: Secondary | ICD-10-CM | POA: Diagnosis present

## 2020-03-08 DIAGNOSIS — F319 Bipolar disorder, unspecified: Secondary | ICD-10-CM | POA: Diagnosis present

## 2020-03-08 DIAGNOSIS — Z9152 Personal history of nonsuicidal self-harm: Secondary | ICD-10-CM | POA: Diagnosis not present

## 2020-03-08 MED ORDER — RISPERIDONE 2 MG PO TBDP
2.0000 mg | ORAL_TABLET | Freq: Every day | ORAL | Status: DC
  Filled 2020-03-08: qty 1

## 2020-03-08 MED ORDER — HYDROXYZINE HCL 25 MG PO TABS
25.0000 mg | ORAL_TABLET | Freq: Three times a day (TID) | ORAL | Status: DC | PRN
  Administered 2020-03-13 – 2020-03-17 (×5): 25 mg via ORAL
  Filled 2020-03-08 (×6): qty 1

## 2020-03-08 MED ORDER — OMEGA-3-ACID ETHYL ESTERS 1 G PO CAPS
1.0000 g | ORAL_CAPSULE | Freq: Two times a day (BID) | ORAL | Status: DC
  Administered 2020-03-09 – 2020-03-10 (×2): 1 g via ORAL
  Filled 2020-03-08 (×8): qty 1

## 2020-03-08 MED ORDER — PRAZOSIN HCL 2 MG PO CAPS
4.0000 mg | ORAL_CAPSULE | Freq: Every day | ORAL | Status: DC
  Filled 2020-03-08 (×2): qty 2

## 2020-03-08 MED ORDER — ACETAMINOPHEN 325 MG PO TABS: 650.0000 mg | ORAL_TABLET | Freq: Four times a day (QID) | ORAL | Status: DC | PRN

## 2020-03-08 MED ORDER — BENZTROPINE MESYLATE 0.5 MG PO TABS
0.5000 mg | ORAL_TABLET | Freq: Two times a day (BID) | ORAL | Status: DC
  Filled 2020-03-08 (×2): qty 1

## 2020-03-08 MED ORDER — PRAZOSIN HCL 2 MG PO CAPS
4.0000 mg | ORAL_CAPSULE | Freq: Every day | ORAL | Status: DC
  Filled 2020-03-08: qty 2

## 2020-03-08 MED ORDER — TRAZODONE HCL 50 MG PO TABS
50.0000 mg | ORAL_TABLET | Freq: Every evening | ORAL | Status: DC | PRN
  Administered 2020-03-14 – 2020-03-15 (×2): 50 mg via ORAL
  Filled 2020-03-08 (×2): qty 1

## 2020-03-08 MED ORDER — ALUM & MAG HYDROXIDE-SIMETH 200-200-20 MG/5ML PO SUSP: 30.0000 mL | ORAL | Status: DC | PRN

## 2020-03-08 MED ORDER — MAGNESIUM HYDROXIDE 400 MG/5ML PO SUSP: 30.0000 mL | Freq: Every day | ORAL | Status: DC | PRN

## 2020-03-08 MED ORDER — TRAZODONE HCL 50 MG PO TABS: 50.0000 mg | ORAL_TABLET | Freq: Every evening | ORAL | Status: DC | PRN

## 2020-03-08 MED ORDER — BENZTROPINE MESYLATE 0.5 MG PO TABS
0.5000 mg | ORAL_TABLET | Freq: Two times a day (BID) | ORAL | Status: DC
  Administered 2020-03-09 – 2020-03-16 (×14): 0.5 mg via ORAL
  Filled 2020-03-08 (×20): qty 1

## 2020-03-08 MED ORDER — HYDROXYZINE HCL 25 MG PO TABS: 25.0000 mg | ORAL_TABLET | Freq: Three times a day (TID) | ORAL | Status: DC | PRN

## 2020-03-08 MED ORDER — RISPERIDONE 2 MG PO TABS
2.0000 mg | ORAL_TABLET | Freq: Every day | ORAL | Status: DC
  Administered 2020-03-08 – 2020-03-09 (×2): 2 mg via ORAL
  Filled 2020-03-08 (×5): qty 1

## 2020-03-08 MED ORDER — ESCITALOPRAM OXALATE 20 MG PO TABS
20.0000 mg | ORAL_TABLET | Freq: Every day | ORAL | Status: DC
  Administered 2020-03-09: 20 mg via ORAL
  Filled 2020-03-08 (×2): qty 1

## 2020-03-08 NOTE — Progress Notes (Signed)
Andrea Pugh is a 25 year old female being involuntarily to 27-2 from [facility].  She presented to [facility] for  delusional thought content believing that somebody has been shot and she was found by [employer] to be driving around in her car looking for that individual.  She appeared to be disorganized and exhibiting some thought blocking and poor historian. She denied SI/HI or A/V hallucinations.  During [facility] admission, she was very guarded.  Minimal with answering questions.  She appears to be thought blocking. She denied SI/HI or A/V hallucinations.  She does not appear to be responding to internal stimuli.  Oriented her to the unit.  Admission paperwork was reviewed but she refused to sign.  Belongings search and secured in locker #16.  Skin assessment completed and no skin issues noted except multiple tattoos.  Q 15 minute checks initiated for safety.  We will continue to monitor the progress towards her goals.

## 2020-03-08 NOTE — ED Notes (Addendum)
Patient pacing unit, continually asking to be allowed to leave because she has visitors. Reminded that she cannot leave due to IVC  and reassured that there are no visitors waiting on her. Asked to speak to 'police' so deputy came to unit to talk to patient, reinforcing what patient had already been told. Patient repeatedly offered food/drink and reassured that she is safe and we will do our best to help her be comfortable through night. Continues to refuse to lie down, take PRNs for sleep. Thought blocking and RIS with hand gestures observed. NP Nira Conn notified of behaviors and additional PRN orders placed.

## 2020-03-08 NOTE — ED Notes (Signed)
PATIENT STATES THAT " PLEASE LET ME GO OUTSIDE THE DOOR TO MY FRIEND. MY FRIEND IS INVINCIBLE, SHE IS STANDING BY THE DOOR WAITING FOR ME". PATIENT CONTINUED TO PUSH ALL DOORS OPEN TO GO MEET HER INVINCIBLE FRIEND.

## 2020-03-08 NOTE — Progress Notes (Signed)
Pt accepted to [facility] Adult room 507-2  Earlene Plater, MD  is the accepting provider.     Call report to [phone]    Marland Mcalpine, LPN @[facility]  notified.     Pt is IVC.    Pt may be transported by   Signed:  [employer], MSW, [place], LCASA 03/08/2020 12:25 PM

## 2020-03-08 NOTE — ED Notes (Signed)
Patient transferred to [facility] via [employer] in no acute distress. Patient denied SI/HI, A/VH upon discharge. Continue responding to internal stimuli upon transport but denies AVH. Patient verbalized understanding of all discharge instructions explained by staff, to include follow up care and safety plan. Pt belongings given to [facility] officer from locker #29 intact. Patient escorted to [place] via [facility] for transport to [facility] via [employer]. Safety maintained.

## 2020-03-08 NOTE — ED Notes (Signed)
Pt resting with eyes closed. Rise and fall of chest noted. Has not made any attempts to elope at this time. Will continue to monitor for safety

## 2020-03-08 NOTE — Discharge Instructions (Addendum)
Transfer to [facility] °

## 2020-03-08 NOTE — Tx Team (Signed)
Initial Treatment Plan 03/08/2020 4:47 PM Andrea Pugh DTO:[id number]    PATIENT STRESSORS: Medication change or noncompliance Other: [place] would not answer any more   PATIENT STRENGTHS: Physical Health Supportive family/friends   PATIENT IDENTIFIED PROBLEMS: Delusional/Paranoia  Depression    She would not answer what goals she has               DISCHARGE CRITERIA:  Improved stabilization in mood, thinking, and/or behavior Need for constant or close observation no longer present Reduction of life-threatening or endangering symptoms to within safe limits Verbal commitment to aftercare and medication compliance  PRELIMINARY DISCHARGE PLAN: Outpatient therapy  PATIENT/FAMILY INVOLVEMENT: This treatment plan has been presented to and reviewed with the patient, Andrea Pugh.  The patient and family have been given the opportunity to ask questions and make suggestions.  Levin Bacon, RN 03/08/2020, 4:47 PM

## 2020-03-08 NOTE — ED Notes (Signed)
Report called to [facility] nurse, Lanora Manis, RN. [employer] called for transport

## 2020-03-08 NOTE — ED Notes (Signed)
Escorted to back [place] to be transported to [facility]. Ambulated per self. No s/s pain, discomfort, or acute distress. A&Ox4.  Medically stable at time of d/c

## 2020-03-08 NOTE — Progress Notes (Signed)
Pt had minimal responses. Pt refused her medications. Pt only wanted to take her Risperdal.
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
## 2020-03-08 NOTE — ED Notes (Signed)
Had to explain to pt about her meds multiple times. Pt finally took meds

## 2020-03-09 DIAGNOSIS — F29 Unspecified psychosis not due to a substance or known physiological condition: Secondary | ICD-10-CM | POA: Diagnosis not present

## 2020-03-09 DIAGNOSIS — F209 Schizophrenia, unspecified: Secondary | ICD-10-CM

## 2020-03-09 LAB — RAPID URINE DRUG SCREEN, HOSP PERFORMED
Amphetamines: NOT DETECTED
Barbiturates: NOT DETECTED
Benzodiazepines: NOT DETECTED
Cocaine: NOT DETECTED
Opiates: NOT DETECTED
Tetrahydrocannabinol: NOT DETECTED

## 2020-03-09 LAB — URINALYSIS, ROUTINE W REFLEX MICROSCOPIC
Bilirubin Urine: NEGATIVE
Glucose, UA: NEGATIVE mg/dL
Ketones, ur: 20 mg/dL — AB
Leukocytes,Ua: NEGATIVE
Nitrite: NEGATIVE
Protein, ur: 100 mg/dL — AB
Specific Gravity, Urine: 1.032 — ABNORMAL HIGH (ref 1.005–1.030)
pH: 5 (ref 5.0–8.0)

## 2020-03-09 LAB — PREGNANCY, URINE: Preg Test, Ur: NEGATIVE

## 2020-03-09 MED ORDER — PRAZOSIN HCL 1 MG PO CAPS
1.0000 mg | ORAL_CAPSULE | Freq: Every day | ORAL | Status: DC
  Administered 2020-03-09: 1 mg via ORAL
  Filled 2020-03-09 (×3): qty 1

## 2020-03-09 NOTE — Progress Notes (Addendum)
[facility] MD Progress Note  Andrea Pugh  MRN:  [id number]  03/10/20  Chief Complaint: paranoia and psychosis  Subjective:  Andrea Pugh is a 24y/o female with past psychiatric history of MDD with psychotic features, anxiety, previous cutting behaviors, bipolar d/o with psychotic features, and multiple past psychiatric hospitalizations, who was admitted after being brought in by police for bizarre behaviors. Prior to admission she was reportedly found by police driving around looking for someone she thought had been shot. She then told police she was driving to her Uncle's but provided an address that did not exist. Her records state that while at the [facility] she was reporting VH of seeing demons, had thought blocking and disorganized behaviors, and attempted to elope.The patient is currently on Hospital Day 1.   Chart Review from last 24 hours:  The patient's chart was reviewed and nursing notes were reviewed. The patient's case was discussed in multidisciplinary team meeting. Per nursing notes, the patient has been paranoid and disorganized on the unit and required prompting to comply with medications. She has had evidence of continued thought blocking. Per MAR she refused her morning Cogentin yesterday, refused 2 doses of her Omega-3 capsule but was compliant with other scheduled medications. She required no PRN medications in last 24 hours.  Information Obtained Today During Patient Interview: The patient was seen and evaluated on the unit in the presence of the medical student. Patient is generally uncooperative for questioning and requires multiple prompts to open her eyes and engage with the team. When questioned about her mood she would not comment. She denies SI or HI. She states she "needs rest" and is in the hospital because "of anxiety." When questioned about AVH, ideas of reference, or first rank symptoms she would not answer. When questioned about sleep, appetite, or physical complaints she  refused to answer. She has periods of thought blocking on exam with hesitation and delayed response to questions.   Principal Problem: Schizophrenia spectrum disorder with psychotic disorder type not yet determined (HCC) Diagnosis: Principal Problem:   Schizophrenia spectrum disorder with psychotic disorder type not yet determined (HCC)  Total Time Spent in Direct Patient Care:  I personally spent 25 minutes on the unit in direct patient care. The direct patient care time included face-to-face time with the patient, reviewing the patient's chart, communicating with other professionals, and coordinating care. Greater than 50% of this time was spent in counseling or coordinating care with the patient regarding goals of hospitalization, psycho-education, and discharge planning needs.  Past Psychiatric History: see admission H&P  Past Medical History: see admission H&P  Family History: see admission H&P  Family Psychiatric  History: see admission H&P  Social History: works at a [employer]; graduated from college and living with a roommate in [place]; denies drug or alcohol use  Sleep: Good per nursing report   Appetite: patient refused to answer  Current Medications: Current Facility-Administered Medications  Medication Dose Route Frequency Provider Last Rate Last Admin  . acetaminophen (TYLENOL) tablet 650 mg  650 mg Oral Q6H PRN Estella Husk, MD      . alum & mag hydroxide-simeth (MAALOX/MYLANTA) 200-200-20 MG/5ML suspension 30 mL  30 mL Oral Q4H PRN Estella Husk, MD      . benztropine (COGENTIN) tablet 0.5 mg  0.5 mg Oral BID Estella Husk, MD      . hydrOXYzine (ATARAX/VISTARIL) tablet 25 mg  25 mg Oral TID PRN Estella Husk, MD      .  magnesium hydroxide (MILK OF MAGNESIA) suspension 30 mL  30 mL Oral Daily PRN Estella Husk, MD      . omega-3 acid ethyl esters (LOVAZA) capsule 1 g  1 g Oral BID Marciano Sequin E, NP      . prazosin  (MINIPRESS) capsule 1 mg  1 mg Oral QHS Early Ord E, MD      . risperiDONE (RISPERDAL) tablet 2 mg  2 mg Oral QHS Aldean Baker, NP   2 mg at 03/08/20 2112  . traZODone (DESYREL) tablet 50 mg  50 mg Oral QHS PRN Aldean Baker, NP       Lab Results:  Results for orders placed or performed during the hospital encounter of 03/08/20 (from the past 48 hour(s))  Urinalysis, Routine w reflex microscopic     Status: Abnormal   Collection Time: 03/08/20  7:33 PM  Result Value Ref Range   Color, Urine AMBER (A) YELLOW    Comment: BIOCHEMICALS MAY BE AFFECTED BY COLOR   APPearance CLOUDY (A) CLEAR   Specific Gravity, Urine 1.032 (H) 1.005 - 1.030   pH 5.0 5.0 - 8.0   Glucose, UA NEGATIVE NEGATIVE mg/dL   Hgb urine dipstick SMALL (A) NEGATIVE   Bilirubin Urine NEGATIVE NEGATIVE   Ketones, ur 20 (A) NEGATIVE mg/dL   Protein, ur 876 (A) NEGATIVE mg/dL   Nitrite NEGATIVE NEGATIVE   Leukocytes,Ua NEGATIVE NEGATIVE   RBC / HPF 11-20 0 - 5 RBC/hpf   WBC, UA 6-10 0 - 5 WBC/hpf   Bacteria, UA FEW (A) NONE SEEN   Squamous Epithelial / LPF 6-10 0 - 5   Mucus PRESENT     Comment: Performed at [facility], [address], [place], [place] [address]  Pregnancy, urine     Status: None   Collection Time: 03/08/20  7:33 PM  Result Value Ref Range   Preg Test, Ur NEGATIVE NEGATIVE    Comment: THE SENSITIVITY OF THIS METHODOLOGY IS >20 mIU/mL. Performed at [facility], [address] [place], [place] [address]   Urine rapid drug screen (hosp performed)not at [facility]     Status: None   Collection Time: 03/08/20  7:33 PM  Result Value Ref Range   Opiates NONE DETECTED NONE DETECTED   Cocaine NONE DETECTED NONE DETECTED   Benzodiazepines NONE DETECTED NONE DETECTED   Amphetamines NONE DETECTED NONE DETECTED   Tetrahydrocannabinol NONE DETECTED NONE DETECTED   Barbiturates NONE DETECTED NONE DETECTED    Comment: (NOTE) DRUG SCREEN FOR MEDICAL PURPOSES ONLY.   IF CONFIRMATION IS NEEDED FOR ANY PURPOSE, NOTIFY LAB WITHIN 5 DAYS.  LOWEST DETECTABLE LIMITS FOR URINE DRUG SCREEN Drug Class                     Cutoff (ng/mL) Amphetamine and metabolites    1000 Barbiturate and metabolites    200 Benzodiazepine                 200 Tricyclics and metabolites     300 Opiates and metabolites        300 Cocaine and metabolites        300 THC                            50 Performed at [facility], [address], [place], [place] [address]    Blood Alcohol level:  Lab Results  Component  Value Date   ETH <10 03/07/2020   ETH <10 04/20/2019   Metabolic Disorder Labs: Lab Results  Component Value Date   HGBA1C 6.0 (H) 03/07/2020   MPG 125.5 03/07/2020   MPG 114.02 04/02/2019   Lab Results  Component Value Date   PROLACTIN 66.1 (H) 04/13/2019   Lab Results  Component Value Date   CHOL 187 03/07/2020   TRIG 49 03/07/2020   HDL 58 03/07/2020   CHOLHDL 3.2 03/07/2020   VLDL 10 03/07/2020   LDLCALC 119 (H) 03/07/2020   LDLCALC 113 (H) 04/02/2019   Physical Findings: AIMS: Facial and Oral Movements Muscles of Facial Expression: None, normal Lips and Perioral Area: None, normal Jaw: None, normal Tongue: None, normal,Extremity Movements Upper (arms, wrists, hands, fingers): None, normal Lower (legs, knees, ankles, toes): None, normal, Trunk Movements Neck, shoulders, hips: None, normal, Overall Severity Severity of abnormal movements (highest score from questions above): None, normal Incapacitation due to abnormal movements: None, normal Patient's awareness of abnormal movements (rate only patient's report): No Awareness, Dental Status Current problems with teeth and/or dentures?: No Does patient usually wear dentures?: No      Musculoskeletal: Strength & Muscle Tone: uncooperative for testing Gait & Station: uncooperative for testing Patient leans: N/A  Psychiatric Specialty Exam: Physical Exam Vitals  reviewed.  Constitutional:      Appearance: Normal appearance.  HENT:     Head: Normocephalic.  Pulmonary:     Effort: Pulmonary effort is normal.     Review of Systems - uncooperative for questioning  Blood pressure (!) 123/92, pulse (!) 105, temperature (!) 97.5 F (36.4 C), temperature source Oral, resp. rate 18, height 5\' 5"  (1.651 m), weight 97.1 kg, SpO2 97 %.Body mass index is 35.61 kg/m.  General Appearance: lying in bed with minimal engagement with team; disheveled appearing with uncombed hair   Eye Contact:  Minimal  Speech:  periods of halting speech; nonspontaneous and will answer select questions with few word answers and delayed responses  Volume:  Decreased  Mood:  Irritable  Affect:  Flat  Thought Process:  Concrete with periods of thought blocking  Orientation:  Would not cooperate for questioning  Thought Content:  Denied SI or HI; would not comment when questioned about AVH, ideas of reference, or first rank symptoms; has thought blocking on exam suggestive of response intermittently to internal stimuli  Suicidal Thoughts:  Denied  Homicidal Thoughts:  Denied  Memory:  Limited secondary to psychosis  Judgement:  Impaired  Insight:  Lacking  Psychomotor Activity:  Decreased  Concentration:  Concentration: Poor  Recall:  Poor  Fund of Knowledge:  Poor  Language:  Fair  Akathisia:  Negative  Assets:  Desire for Improvement Resilience  ADL's:  Impaired  Cognition:  Impaired,  Mild  Sleep:  Number of Hours: 7.25   Treatment Plan Summary:  ASSESSMENT: Diagnoses / Active Problems: Unspecified schizophrenia spectrum and other psychotic d/o (r/o MDD with psychotic features, r/o bipolar with psychotic features, r/o schizoaffective d/o)  PLAN: 1. Safety and Monitoring:  -- Involuntary admission to inpatient psychiatric unit for safety, stabilization and treatment  -- Daily contact with patient to assess and evaluate symptoms and progress in treatment  --  Patient's case to be discussed in multi-disciplinary team meeting  -- Observation Level : q15 minute checks  -- Vital signs:  q12 hours  -- Precautions: suicide and elopement  2. Psychiatric Diagnoses and Treatment:  Unspecified schizophrenia spectrum and other psychotic d/o (r/o MDD with psychotic features,  r/o bipolar with psychotic features, r/o schizoaffective d/o)  -- Increase Risperdal M tabs to 1mg  po qam and 2mg  po qhs for residual psychosis -- Continue Cogentin 0.5mg  bid -- Does not appear to have acute mania so will restart home Lexapro 10mg  daily in the event this is related to MDD with psychosis and to help with reported anxiety -- Discontinue Prazosin 1mg  qhs in the event this is contributing to drowsiness in combination with antipsychotic - compliance with med prior to admission is also questionable -- Discontinue Omega 3 due to reduce polypharmacy given her recent paranoia with medications and refusal of med -- Attempting to get collateral from [facility] for recent diagnosis and medication trials  -- Metabolic profile and EKG monitoring obtained while on an atypical antipsychotic (BMI:35.61; Lipid Panel:cholesterol 187, HDL 58, LDL 119, triglycerides 49; HbgA1c:6.0 QTc:457)   -- Encouraged patient to participate in unit milieu and in scheduled group therapies   -- Short Term Goals: Ability to identify changes in lifestyle to reduce recurrence of condition will improve and Ability to demonstrate self-control will improve  -- Long Term Goals: Improvement in symptoms so as ready for discharge  3. Medical Issues Being Addressed:   Questionable clean catch contaminant on UA  -- Repeat clean catch UA and urine culture pending when she will comply with giving specimen   Elevated total bilirubin on admission (1.7) - resolved  -- Hepatic function panel on 03/10/20 shows total bili 0.9 and remainder of panel WNL   Thrombocytosis (platelets 422)  -- Repeat CBC on 03/10/20 shows platelets 411  with remainder of CBC WNL  -- will need f/u with PCP after discharge for trending and f/u as an outpatient  4. Discharge Planning:   -- Social work and case management to assist with discharge planning and identification of hospital follow-up needs prior to discharge  -- Estimated LOS: 5-7 days  -- Discharge Concerns: Need to establish a safety plan; Medication compliance and effectiveness  -- Discharge Goals: Return home with outpatient referrals for mental health follow-up including medication management/psychotherapy  , MD, FAPA 03/09/2020, 4:43 PM

## 2020-03-09 NOTE — H&P (Signed)
Psychiatric Admission Assessment Adult  Patient Identification: Andrea Pugh  MRN:  [id number]  Date of Evaluation:  03/09/2020  Chief Complaint:  Bipolar disorder (HCC) [F31.9] Delusional disorder (HCC) [F22]  Principal Diagnosis: <principal problem not specified>  Diagnosis:  Active Problems:   Bipolar disorder (HCC)   Delusional disorder (HCC)  History of Present Illness: (Per Md's admission SRA notes): Patient is a 25y/o female with past psychiatric history of MDD with psychotic features, anxiety, previous cutting behaviors, bipolar d/o with psychotic features, and multiple past psychiatric hospitalizations, who was admitted after being brought in by police for bizarre behaviors. The patient is a poor historian and will only answer limited questions. She does not know why police brought her to the ED or how police were called. According to her [facility] note, she was found by police driving around looking for an individual she believed had been shot. She then told police she was driving to her Uncle's house but provided an address that does not exist. When questioned as to why she is in the hospital, she states she was admitted for "anxiety" and "for freaking out about something personal." She thinks she has a diagnosis of bipolar d/o and states she is followed with online psychiatric appointments with a provider at [facility] in [place], [place]. She cannot recall her provider's name and does not know her current or past medication trials. She is vague as to whether or not she has been taking any recent psychotropic medications. She denies AVH, ideas of reference, first rank symptoms or paranoia, but she has obvious thought blocking and paranoia on exam. Her records state that she was reporting VH of seeing demons, had thought blocking and disorganized behaviors, and attempted to elope from the ED prior to admission. The patient denies SI or HI. She denies drug or alcohol use. She reports that she has a  roommate and lives in a student apartment after graduating from a local college in the area. 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  (Hypo) Manic Symptoms:  Delusions, Labiality of Mood,  Anxiety Symptoms:  Excessive Worry,  Psychotic Symptoms:  Delusions,  Duration of Psychotic Symptoms: Less than six months  PTSD Symptoms: Patient at this time is unable to provide this informations. She presents with delusions & thought blocking episodes.  Total Time spent with patient: 1 hour  Past Psychiatric History: Bipolar 1 disorder.  Is the patient at risk to self? No.  Has the patient been a risk to self in the past 6 months? No.  Has the patient been a risk to self within the distant past? Yes.    Is the patient a risk to others? No.  Has the patient been a risk to others in the past 6 months? No.  Has the patient been a risk to others within the distant past? No.   Prior Inpatient Therapy: [facility] x multiple times. Prior Outpatient Therapy: Yes   Alcohol Screening: 1. How often do you have a drink containing alcohol?: Never 2. How many drinks containing alcohol do you have on a typical day when you are drinking?: 1 or 2 3. How often do you have six or more drinks on one occasion?: Never AUDIT-C Score: 0 9. Have you or someone else been injured as a result of your drinking?: No 10. Has a relative or friend or a doctor or another  health worker been concerned about your drinking or suggested you cut down?: No Alcohol Use Disorder Identification Test Final Score (AUDIT): 0 Alcohol Brief Interventions/Follow-up: AUDIT Score <7 follow-up not indicated  Substance Abuse History in the last 12 months:  No.  Consequences of Substance Abuse: NA  Previous Psychotropic Medications: Yes, (Risperdal, Lexapro, Vistaril)  Psychological  Evaluations: No   Past Medical History:  Past Medical History:  Diagnosis Date  . Anxiety   . Depression    History reviewed. No pertinent surgical history.  Family History: History reviewed. No pertinent family history.  Family Psychiatric  History: Patient at this time is unable to provide this information due to presentation of thought blocking episodes.  Tobacco Screening: Have you used any form of tobacco in the last 30 days? (Cigarettes, Smokeless Tobacco, Cigars, and/or Pipes): No  Social History: Single. Has no children, Lives in [place], [place]. Social History   Substance and Sexual Activity  Alcohol Use Yes   Comment: occasional     Social History   Substance and Sexual Activity  Drug Use Yes  . Types: Marijuana    Additional Social History:  Allergies:  No Known Allergies  Lab Results:  Results for orders placed or performed during the hospital encounter of 03/08/20 (from the past 48 hour(s))  Urinalysis, Routine w reflex microscopic     Status: Abnormal   Collection Time: 03/08/20  7:33 PM  Result Value Ref Range   Color, Urine AMBER (A) YELLOW    Comment: BIOCHEMICALS MAY BE AFFECTED BY COLOR   APPearance CLOUDY (A) CLEAR   Specific Gravity, Urine 1.032 (H) 1.005 - 1.030   pH 5.0 5.0 - 8.0   Glucose, UA NEGATIVE NEGATIVE mg/dL   Hgb urine dipstick SMALL (A) NEGATIVE   Bilirubin Urine NEGATIVE NEGATIVE   Ketones, ur 20 (A) NEGATIVE mg/dL   Protein, ur 505 (A) NEGATIVE mg/dL   Nitrite NEGATIVE NEGATIVE   Leukocytes,Ua NEGATIVE NEGATIVE   RBC / HPF 11-20 0 - 5 RBC/hpf   WBC, UA 6-10 0 - 5 WBC/hpf   Bacteria, UA FEW (A) NONE SEEN   Squamous Epithelial / LPF 6-10 0 - 5   Mucus PRESENT     Comment: Performed at [facility], [address], [place], [place] [address]  Pregnancy, urine     Status: None   Collection Time: 03/08/20  7:33 PM  Result Value Ref Range   Preg Test, Ur NEGATIVE NEGATIVE    Comment: THE SENSITIVITY OF  THIS METHODOLOGY IS >20 mIU/mL. Performed at [facility], [address] [place], [place] [address]   Urine rapid drug screen (hosp performed)not at [facility]     Status: None   Collection Time: 03/08/20  7:33 PM  Result Value Ref Range   Opiates NONE DETECTED NONE DETECTED   Cocaine NONE DETECTED NONE DETECTED   Benzodiazepines NONE DETECTED NONE DETECTED   Amphetamines NONE DETECTED NONE DETECTED   Tetrahydrocannabinol NONE DETECTED NONE DETECTED   Barbiturates NONE DETECTED NONE DETECTED    Comment: (NOTE) DRUG SCREEN FOR MEDICAL PURPOSES ONLY.  IF CONFIRMATION IS NEEDED FOR ANY PURPOSE, NOTIFY LAB WITHIN 5 DAYS.  LOWEST DETECTABLE LIMITS FOR URINE DRUG SCREEN Drug Class                     Cutoff (ng/mL) Amphetamine and metabolites    1000 Barbiturate and metabolites    200 Benzodiazepine  200 Tricyclics and metabolites     300 Opiates and metabolites        300 Cocaine and metabolites        300 THC                            50 Performed at [facility], [address], [place], [place] [address]    Blood Alcohol level:  Lab Results  Component Value Date   [facility] <10 03/07/2020   ETH <10 04/20/2019   Metabolic Disorder Labs:  Lab Results  Component Value Date   HGBA1C 6.0 (H) 03/07/2020   MPG 125.5 03/07/2020   MPG 114.02 04/02/2019   Lab Results  Component Value Date   PROLACTIN 66.1 (H) 04/13/2019   Lab Results  Component Value Date   CHOL 187 03/07/2020   TRIG 49 03/07/2020   HDL 58 03/07/2020   CHOLHDL 3.2 03/07/2020   VLDL 10 03/07/2020   LDLCALC 119 (H) 03/07/2020   LDLCALC 113 (H) 04/02/2019   Current Medications: Current Facility-Administered Medications  Medication Dose Route Frequency Provider Last Rate Last Admin  . acetaminophen (TYLENOL) tablet 650 mg  650 mg Oral Q6H PRN Estella Husk, MD      . alum & mag hydroxide-simeth (MAALOX/MYLANTA) 200-200-20 MG/5ML suspension 30 mL  30 mL  Oral Q4H PRN Estella Husk, MD      . benztropine (COGENTIN) tablet 0.5 mg  0.5 mg Oral BID Estella Husk, MD      . escitalopram (LEXAPRO) tablet 20 mg  20 mg Oral Daily Aldean Baker, NP   20 mg at 03/09/20 0950  . hydrOXYzine (ATARAX/VISTARIL) tablet 25 mg  25 mg Oral TID PRN Estella Husk, MD      . magnesium hydroxide (MILK OF MAGNESIA) suspension 30 mL  30 mL Oral Daily PRN Estella Husk, MD      . omega-3 acid ethyl esters (LOVAZA) capsule 1 g  1 g Oral BID Aldean Baker, NP      . prazosin (MINIPRESS) capsule 4 mg  4 mg Oral QHS Aldean Baker, NP      . risperiDONE (RISPERDAL) tablet 2 mg  2 mg Oral QHS Aldean Baker, NP   2 mg at 03/08/20 2112  . traZODone (DESYREL) tablet 50 mg  50 mg Oral QHS PRN Aldean Baker, NP       PTA Medications: No medications prior to admission.   Musculoskeletal: Strength & Muscle Tone: within normal limits Gait & Station: normal Patient leans: N/A  Psychiatric Specialty Exam: Physical Exam Vitals and nursing note reviewed.  HENT:     Head: Normocephalic.     Nose: Nose normal.     Mouth/Throat:     Pharynx: Oropharynx is clear.  Eyes:     Pupils: Pupils are equal, round, and reactive to light.  Pulmonary:     Effort: Pulmonary effort is normal.  Genitourinary:    Comments: Deferred Musculoskeletal:        General: Normal range of motion.     Cervical back: Normal range of motion.  Skin:    General: Skin is warm and dry.  Neurological:     General: No focal deficit present.     Mental Status: She is alert and oriented to person, place, and time.     Review of Systems  Constitutional: Negative for chills, diaphoresis and fever.  HENT: Negative  for congestion, rhinorrhea, sneezing and sore throat.   Eyes: Negative for discharge.  Respiratory: Negative for cough, shortness of breath and wheezing.   Cardiovascular: Negative for chest pain and palpitations.  Gastrointestinal: Negative for diarrhea,  nausea and vomiting.  Endocrine: Negative for cold intolerance.  Genitourinary: Negative for difficulty urinating.  Musculoskeletal: Negative for arthralgias and myalgias.  Skin: Negative.   Allergic/Immunologic: Negative for environmental allergies and food allergies.       Allergies: NKDA  Neurological: Negative for dizziness, tremors, seizures, syncope, facial asymmetry, speech difficulty, weakness, light-headedness, numbness and headaches.  Psychiatric/Behavioral: Positive for confusion, decreased concentration, dysphoric mood and sleep disturbance. Negative for agitation and behavioral problems. The patient is nervous/anxious. The patient is not hyperactive.     Blood pressure (!) 123/92, pulse (!) 105, temperature (!) 97.5 F (36.4 C), temperature source Oral, resp. rate 18, height 5\' 5"  (1.651 m), weight 97.1 kg, SpO2 97 %.Body mass index is 35.61 kg/m.  General Appearance: Casual and Fairly Groomed  Eye Contact:  Minimal  Speech:  Slow and soft  Volume:  Decreased  Mood:  Anxious and Dysphoric  Affect:  Blunt  Thought Process:  Disorganized and Descriptions of Associations: Tangential  Orientation:  Full (Time, Place, and Person)  Thought Content:  Delusions, Hallucinations: responding to some internal stimuli. and Paranoid Ideation  Suicidal Thoughts:  Denies  Homicidal Thoughts:  Denies  Memory:  Immediate;   Poor Recent;   Poor Remote;   Poor  Judgement:  Impaired  Insight:  Lacking  Psychomotor Activity:  Decreased  Concentration: Poor  Recall:  Poor  Fund of Knowledge:  Poor  Language:  Fair  Akathisia:  Negative  Handed:  Right  AIMS (if indicated):     Assets:  Desire for Improvement Physical Health Resilience  ADL's:  Intact  Cognition:  Impaired,  Mild  Sleep:  Number of Hours: 7.25   Treatment Plan Summary: Daily contact with patient to assess and evaluate symptoms and progress in treatment and Medication management.  Treatment Plan/Recommendations:   1. Admit for crisis management and stabilization, estimated length of stay 3-5 days.    2. Medication management to reduce current symptoms to base line and improve the patient's overall level of functioning: See MAR, Md's SRA & treatment plan.   Observation Level/Precautions:  15 minute checks  Laboratory:  Per ED, current lab results reviewed.  Psychotherapy: Group sessions  Medications: See MAR  Consultations: As needed.  Discharge Concerns: Safety, mood stabilization.    Estimated LOS: 5-7 days  Other: Admit to the 500 hall.     Physician Treatment Plan for Primary Diagnosis: <principal problem not specified>  Long Term Goal(s): Improvement in symptoms so as ready for discharge  Short Term Goals: Ability to verbalize feelings will improve, Ability to demonstrate self-control will improve and Ability to identify and develop effective coping behaviors will improve  Physician Treatment Plan for Secondary Diagnosis: Active Problems:   Bipolar disorder (HCC)   Delusional disorder (HCC)  Long Term Goal(s): Improvement in symptoms so as ready for discharge  Short Term Goals: Ability to identify and develop effective coping behaviors will improve, Ability to maintain clinical measurements within normal limits will improve and Compliance with prescribed medications will improve  I certify that inpatient services furnished can reasonably be expected to improve the patient's condition.    Armandina Stammer, NP, PMHNP, FNP-BC 2/2/202210:04 AM

## 2020-03-09 NOTE — Progress Notes (Signed)
Recreation Therapy Notes  Patient admitted to unit 2.1.22. Due to admission within last year, no new recreation therapy assessment conducted at this time. Last assessment conducted 3.11.21. Patient reports not being able to think of any stressors at this time.  Reason for admission per patient is anxiety.  Patient stated coping skills continue to be isolation, journal, arguments, music, exercise, deep breathing, talk, art, avoidance, read, dance, hot bath/shower and the Internet.  Leisure interests are still reading, painting, drawing, exercise, dance, jumping jacks and crunches.  Patient strengths remain being creative and critical thinking.  Patient areas of improvement are still controlling emotions, stop focusing on things that don't involve her and added more positive thinking.  Patient reports goal of meditating more.  Patient denies SI, HI, AVH at this time.      Caroll Rancher, LRT/CTRS   Caroll Rancher A 03/09/2020 12:33 PM

## 2020-03-09 NOTE — Progress Notes (Signed)
Recreation Therapy Notes  Date: 2.2.22 Time: 1000 Location: 500 Hall Dayroom  Group Topic: Self-Esteem  Goal Area(s) Addresses:  Patient will identify positive traits about themselves. Patient will identify what influences how they feel about themselves. Patient will identify how positive self esteem can be used post d/c.  Intervention: Blank facial outlines  Activity: How I See Me.  Patients were given a blank outline of a face.  Patients were to use words/drawings to highlight the positives about themselves.  Education:  Self-esteem, Discharge planning  Education Outcome: Acknowledges understanding/In group clarification offered/Needs additional education  Clinical Observations/Feedback: Pt did not attend group session.    Caroll Rancher, LRT/CTRS         Caroll Rancher A 03/09/2020 11:43 AM

## 2020-03-09 NOTE — [facility] Group Notes (Signed)
LCSW Group Therapy Note  Type of Therapy/Topic: Group Therapy: Six Dimensions of Wellness  Participation Level: None  Description of Group: This group will address the concept of wellness and the six concepts of wellness: occupational, physical, social, intellectual, spiritual, and emotional. Patients will be encouraged to process areas in their lives that are out of balance and identify reasons for remaining unbalanced. Patients will be encouraged to explore ways to practice healthy habits daily to attain better physical and mental health outcomes.  Therapeutic Goals: 1. Identify aspects of wellness that they are doing well. 2. Identify aspects of wellness that they would like to improve upon. 3. Identify one action they can take to improve an aspect of wellness in their lives.   Summary of Patient Progress: Patient came to group, however left at the very beginning and did not return.   Therapeutic Modalities: Cognitive Behavioral Therapy Solution-Focused Therapy Relapse Prevention   Ruthann Cancer MSW, LCSW Clincal Social Worker  [employer]

## 2020-03-09 NOTE — Progress Notes (Signed)
   03/09/20 0700  Sleep  Number of Hours 7.25

## 2020-03-09 NOTE — [facility] Suicide Risk Assessment (Addendum)
[facility] Admission Suicide Risk Assessment   Nursing information obtained from:  Chart review and limited answers from patient Demographic factors:  Young adult Current Mental Status: paranoia, thought blocking, report of VH prior to admission  Loss Factors:  Unknown - patient unable to answer Historical Factors:  Impulsivity, h/o previous psychiatric admissions for bipolar d/o, MDD, and psychosis; h/o past self-injurious behaviors (cutting);  Risk Reduction Factors:  Lives with a roommate; previous response to medication trials; employed  Total Time Spent in Direct Patient Care:  I personally spent 40 minutes on the unit in direct patient care. The direct patient care time included face-to-face time with the patient, reviewing the patient's chart, communicating with other professionals, and coordinating care. Greater than 50% of this time was spent in counseling or coordinating care with the patient regarding goals of hospitalization, psycho-education, and discharge planning needs.  Principal Problem: Schizophrenia spectrum disorder with psychotic disorder type not yet determined (HCC) Diagnosis:  Principal Problem:   Schizophrenia spectrum disorder with psychotic disorder type not yet determined (HCC)  Subjective Data: Patient is a 24y/o female with past psychiatric history of MDD with psychotic features, anxiety, previous cutting behaviors, bipolar d/o with psychotic features, and multiple past psychiatric hospitalizations, who was admitted after being brought in by police for bizarre behaviors. The patient is a poor historian and will only answer limited questions. She does not know why police brought her to the ED or how police were called. According to her [facility] note, she was found by police driving around looking for an individual she believed had been shot. She then told police she was driving to her Uncle's house but provided an address that does not exist. When questioned as to why she is in the  hospital, she states she was admitted for "anxiety" and "for freaking out about something personal." She thinks she has a diagnosis of bipolar d/o and states she is followed with online psychiatric appointments with a provider at [facility] in [place], [place]. She cannot recall her provider's name and does not know her current or past medication trials. She is vague as to whether or not she has been taking any recent psychotropic medications. She denies AVH, ideas of reference, first rank symptoms or paranoia, but she has obvious thought blocking and paranoia on exam. Her records state that she was reporting VH of seeing demons, had thought blocking and disorganized behaviors, and attempted to elope from the ED prior to admission. The patient denies SI or HI. She denies drug or alcohol use. She reports that she has a roommate and lives in a student apartment after graduating from a local college in the area. 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  World Science writer [facility]). Score between 0-7:  no or low risk or alcohol related problems. Score between 8-15:  moderate risk of alcohol related problems. Score between 16-19:  high risk of alcohol related problems. Score 20 or above:  warrants further diagnostic evaluation for alcohol dependence and treatment.  CLINICAL FACTORS:   More than one psychiatric diagnosis Currently Psychotic Previous Psychiatric Diagnoses and Treatments  Musculoskeletal: Strength & Muscle Tone: within normal limits Gait & Station: normal , steady Patient leans: N/A  Psychiatric Specialty Exam: Physical Exam Vitals reviewed.  HENT:     Head: Normocephalic.  Pulmonary:  Effort: Pulmonary effort is normal.  Neurological:     Mental Status: She is alert.     ROS:  would not cooperate to answer questions  Blood pressure (!) 123/92, pulse (!) 105, temperature (!) 97.5 F (36.4 C), temperature source Oral, resp. rate 18, height 5\' 5"  (1.651 m), weight 97.1 kg, SpO2 97 %.Body mass index is 35.61 kg/m.  General Appearance: causally dressed, fair hygiene, appears stated age  Eye Contact:  Minimal - frequently looks about the room  Speech:  Frequently halting with long pauses; non-spontaneous and only answers direct questions in short phrases  Volume:  Decreased  Mood:  Anxious and guarded  Affect:  suspicous, guarded, restricted  Thought Process:  Disorganized  Orientation:  Oriented to month, self, year and city  Thought Content:  Denies AVH, ideas of reference, paranoia or first rank symptoms but during interview has thought blocking and eyes darting about the room as if responding to internal stimuli and appears paranoid on exam  Suicidal Thoughts:  Denied  Homicidal Thoughts:  Denied  Memory:  Would not cooperate for questioning  Judgement:  Impaired  Insight:  Lacking  Psychomotor Activity:  Normal  Concentration:  Concentration: Poor and Attention Span: Poor  Recall:  Poor  Fund of Knowledge:  Limited in context of psychosis  Language:  Fair  Akathisia:  Negative  Assets:  Desire for Improvement Resilience  ADL's:  Intact  Cognition:  Impaired,  Mild  Sleep:  Number of Hours: 7.25   COGNITIVE FEATURES THAT CONTRIBUTE TO RISK:  Loss of executive function    SUICIDE RISK:   Moderate:  Frequent suicidal ideation with limited intensity, and duration, some specificity in terms of plans, no associated intent, good self-control, limited dysphoria/symptomatology, some risk factors present, and identifiable protective factors, including available and accessible social support.  PLAN OF CARE: Patient will remain under IVC. Her admission labs were reviewed and TSH was 0.914, Pregnancy test negative, lipid panel WNL except for LDL 119, UDS negative,  alcohol <10, CMP WNL except for total bili 1.7, CBC WNL except for platelets 422, HbgA1c 6.0. EKG shows NSR with sinus arrhthymia and QTc 457. Will check LFTs and recheck CBC for monitoring. Questionable clean catch contaminant on UA - will send for urine culture and attempt clean catch when she can cooperate for sample. We will hold her Lexapro in the event she is having a bipolar manic episode and will continue Risperdal 2mg  qhs and Cogentin 0.5mg  bid titrating up as needed for psychosis. Since it is unclear if she has been taking her Prazosin 4mg  prior to admission, we will reduce this to 1mg  qhs and re-titrate up as needed. We will attempt to get collateral history from her outpatient provider with her consent.   I certify that inpatient services furnished can reasonably be expected to improve the patient's condition.   , MD, FAPA 03/09/2020, 10:46 AM

## 2020-03-09 NOTE — Progress Notes (Signed)
   03/09/20 0000  Psych Admission Type (Psych Patients Only)  Admission Status Involuntary  Psychosocial Assessment  Patient Complaints Disorientation  Eye Contact Darting  Facial Expression Flat  Affect Flat  Speech Slow  Interaction No initiation;Minimal  Motor Activity Slow  Appearance/Hygiene Unremarkable  Behavior Characteristics Unwilling to participate  Mood Suspicious;Anxious  Thought Process  Coherency Blocking;Disorganized  Content Delusions  Delusions UTA  Perception Derealization  Hallucination None reported or observed  Judgment Poor  Confusion Moderate  Danger to Self  Current suicidal ideation? Denies  Danger to Others  Danger to Others None reported or observed

## 2020-03-09 NOTE — Progress Notes (Signed)
Pt would only take her Risperdal. When writer asked why she would not take Cogentin or Prazosin , pt would not respond .

## 2020-03-09 NOTE — Progress Notes (Addendum)
Pt presents with constricted affect, she's guarded, isolative to room majority of this shift and is hypervigilant and suspicious on interactions. Denies SI, HI, AVH and pain when assessed. Observed with delayed responses as if she's thought blocking. Out of room when prompted for medications and meals. Only took her Lexapro this morning "I don't want nothing else. I only want my anxiety medication". Refused evening medications on initial offers X3. Later came back and took her medications after being informed that she's not leaving today after multiple demands for d/c "well I will take it then". Support, encouragement and reassurance offered. All medications offered as ordered. Urine cup given and pt informed of the need to provide sample. Q 15 minutes safety checks maintained.  Pt tolerates meals well. Remains safe on unit. Did not attend scheduled groups despite multiple prompts.
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
## 2020-03-09 NOTE — Tx Team (Signed)
Interdisciplinary Treatment and Diagnostic Plan Update  03/09/2020 Time of Session: 10:35am Andrea Pugh MRN: [id number]  Principal Diagnosis: Schizophrenia spectrum disorder with psychotic disorder type not yet determined [facility])  Secondary Diagnoses: Principal Problem:   Schizophrenia spectrum disorder with psychotic disorder type not yet determined (HCC)   Current Medications:  Current Facility-Administered Medications  Medication Dose Route Frequency Provider Last Rate Last Admin  . acetaminophen (TYLENOL) tablet 650 mg  650 mg Oral Q6H PRN Estella Husk, MD      . alum & mag hydroxide-simeth (MAALOX/MYLANTA) 200-200-20 MG/5ML suspension 30 mL  30 mL Oral Q4H PRN Estella Husk, MD      . benztropine (COGENTIN) tablet 0.5 mg  0.5 mg Oral BID Estella Husk, MD      . hydrOXYzine (ATARAX/VISTARIL) tablet 25 mg  25 mg Oral TID PRN Estella Husk, MD      . magnesium hydroxide (MILK OF MAGNESIA) suspension 30 mL  30 mL Oral Daily PRN Estella Husk, MD      . omega-3 acid ethyl esters (LOVAZA) capsule 1 g  1 g Oral BID Marciano Sequin E, NP      . prazosin (MINIPRESS) capsule 1 mg  1 mg Oral QHS Singleton, Amy E, MD      . risperiDONE (RISPERDAL) tablet 2 mg  2 mg Oral QHS Aldean Baker, NP   2 mg at 03/08/20 2112  . traZODone (DESYREL) tablet 50 mg  50 mg Oral QHS PRN Aldean Baker, NP       PTA Medications: No medications prior to admission.    Patient Stressors: Medication change or noncompliance Other: Andrea Pugh would not answer any more  Patient Strengths: Physical Health Supportive family/friends  Treatment Modalities: Medication Management, Group therapy, Case management,  1 to 1 session with clinician, Psychoeducation, Recreational therapy.   Physician Treatment Plan for Primary Diagnosis: Schizophrenia spectrum disorder with psychotic disorder type not yet determined (HCC) Long Term Goal(s): Improvement in symptoms so as ready for  discharge Improvement in symptoms so as ready for discharge   Short Term Goals: Ability to verbalize feelings will improve Ability to demonstrate self-control will improve Ability to identify and develop effective coping behaviors will improve Ability to identify and develop effective coping behaviors will improve Ability to maintain clinical measurements within normal limits will improve Compliance with prescribed medications will improve  Medication Management: Evaluate patient's response, side effects, and tolerance of medication regimen.  Therapeutic Interventions: 1 to 1 sessions, Unit Group sessions and Medication administration.  Evaluation of Outcomes: Progressing  Physician Treatment Plan for Secondary Diagnosis: Principal Problem:   Schizophrenia spectrum disorder with psychotic disorder type not yet determined (HCC)  Long Term Goal(s): Improvement in symptoms so as ready for discharge Improvement in symptoms so as ready for discharge   Short Term Goals: Ability to verbalize feelings will improve Ability to demonstrate self-control will improve Ability to identify and develop effective coping behaviors will improve Ability to identify and develop effective coping behaviors will improve Ability to maintain clinical measurements within normal limits will improve Compliance with prescribed medications will improve     Medication Management: Evaluate patient's response, side effects, and tolerance of medication regimen.  Therapeutic Interventions: 1 to 1 sessions, Unit Group sessions and Medication administration.  Evaluation of Outcomes: Progressing   RN Treatment Plan for Primary Diagnosis: Schizophrenia spectrum disorder with psychotic disorder type not yet determined (HCC) Long Term Goal(s): Knowledge of disease and therapeutic regimen to maintain health will  improve  Short Term Goals: Ability to demonstrate self-control, Ability to participate in decision making will  improve and Ability to verbalize feelings will improve  Medication Management: RN will administer medications as ordered by provider, will assess and evaluate patient's response and provide education to patient for prescribed medication. RN will report any adverse and/or side effects to prescribing provider.  Therapeutic Interventions: 1 on 1 counseling sessions, Psychoeducation, Medication administration, Evaluate responses to treatment, Monitor vital signs and CBGs as ordered, Perform/monitor CIWA, COWS, AIMS and Fall Risk screenings as ordered, Perform wound care treatments as ordered.  Evaluation of Outcomes: Progressing   LCSW Treatment Plan for Primary Diagnosis: Schizophrenia spectrum disorder with psychotic disorder type not yet determined (HCC) Long Term Goal(s): Safe transition to appropriate next level of care at discharge, Engage patient in therapeutic group addressing interpersonal concerns.  Short Term Goals: Engage patient in aftercare planning with referrals and resources, Increase social support and Increase ability to appropriately verbalize feelings  Therapeutic Interventions: Assess for all discharge needs, 1 to 1 time with Social worker, Explore available resources and support systems, Assess for adequacy in community support network, Educate family and significant other(s) on suicide prevention, Complete Psychosocial Assessment, Interpersonal group therapy.  Evaluation of Outcomes: Progressing   Progress in Treatment: Attending groups: No. Participating in groups: No. Taking medication as prescribed: No. Toleration medication: No. Family/Significant other contact made: Yes, individual(s) contacted:  will obtain consents from pt Patient understands diagnosis: No. Discussing patient identified problems/goals with staff: Yes. Medical problems stabilized or resolved: Yes. Denies suicidal/homicidal ideation: Yes. Issues/concerns per patient self-inventory: No. Other:  None  New problem(s) identified: No, Describe:  CSW will continue to assess  New Short Term/Long Term Goal(s):medication stabilization, elimination of SI thoughts, development of comprehensive mental wellness plan.  Patient Goals:  "to work on my breathing techniques."  Discharge Plan or Barriers: Patient recently admitted. CSW will continue to follow and assess for appropriate referrals and possible discharge planning.  Reason for Continuation of Hospitalization: Delusions  Medication stabilization  Estimated Length of Stay: 3-5 days  Attendees: Patient: Andrea Pugh 03/09/2020   Physician: Dr. Cindi Carbon 03/09/2020   Nursing:  03/09/2020   RN Care Manager: 03/09/2020   Social Worker: Fredirick Lathe, Theresia Majors 03/09/2020   Recreational Therapist:  03/09/2020   Other:  03/09/2020   Other:  03/09/2020   Other: 03/09/2020       Scribe for Treatment Team: Felizardo Hoffmann, Theresia Majors 03/09/2020 2:02 PM

## 2020-03-10 LAB — HEPATIC FUNCTION PANEL
ALT: 27 U/L (ref 0–44)
AST: 17 U/L (ref 15–41)
Albumin: 4.4 g/dL (ref 3.5–5.0)
Alkaline Phosphatase: 68 U/L (ref 38–126)
Bilirubin, Direct: <0.1 mg/dL (ref 0.0–0.2)
Total Bilirubin: 0.9 mg/dL (ref 0.3–1.2)
Total Protein: 8 g/dL (ref 6.5–8.1)

## 2020-03-10 LAB — CBC WITH DIFFERENTIAL/PLATELET
Abs Immature Granulocytes: 0.02 10*3/uL (ref 0.00–0.07)
Basophils Absolute: 0 10*3/uL (ref 0.0–0.1)
Basophils Relative: 0 %
Eosinophils Absolute: 0 10*3/uL (ref 0.0–0.5)
Eosinophils Relative: 0 %
HCT: 36.9 % (ref 36.0–46.0)
Hemoglobin: 12.7 g/dL (ref 12.0–15.0)
Immature Granulocytes: 0 %
Lymphocytes Relative: 34 %
Lymphs Abs: 2.1 10*3/uL (ref 0.7–4.0)
MCH: 31.2 pg (ref 26.0–34.0)
MCHC: 34.4 g/dL (ref 30.0–36.0)
MCV: 90.7 fL (ref 80.0–100.0)
Monocytes Absolute: 0.6 10*3/uL (ref 0.1–1.0)
Monocytes Relative: 9 %
Neutro Abs: 3.6 10*3/uL (ref 1.7–7.7)
Neutrophils Relative %: 57 %
Platelets: 411 10*3/uL — ABNORMAL HIGH (ref 150–400)
RBC: 4.07 MIL/uL (ref 3.87–5.11)
RDW: 12.7 % (ref 11.5–15.5)
WBC: 6.3 10*3/uL (ref 4.0–10.5)
nRBC: 0 % (ref 0.0–0.2)

## 2020-03-10 MED ORDER — RISPERIDONE 2 MG PO TBDP
2.0000 mg | ORAL_TABLET | Freq: Every day | ORAL | Status: DC
  Administered 2020-03-10 – 2020-03-14 (×5): 2 mg via ORAL
  Filled 2020-03-10 (×6): qty 1

## 2020-03-10 MED ORDER — RISPERIDONE 1 MG PO TBDP
1.0000 mg | ORAL_TABLET | Freq: Every day | ORAL | Status: DC
  Administered 2020-03-10 – 2020-03-14 (×5): 1 mg via ORAL
  Filled 2020-03-10 (×9): qty 1

## 2020-03-10 MED ORDER — ESCITALOPRAM OXALATE 10 MG PO TABS
10.0000 mg | ORAL_TABLET | Freq: Every day | ORAL | Status: DC
  Administered 2020-03-10 – 2020-03-16 (×7): 10 mg via ORAL
  Filled 2020-03-10 (×8): qty 1

## 2020-03-10 NOTE — [facility] Group Notes (Signed)
Occupational Therapy Group Note Date: 03/10/2020 Group Topic/Focus: Self-Care  Group Description: Group encouraged increased engagement and participation through discussion and art activity focused on self-care. Patients were educated and provided information on the five subcategories of self-care including physical, emotional, social, spiritual, and professional. Group members were then encouraged to create a visual self-care chart to represent what each category of self-care looks like for themselves or where they would like it to be. Patients were encouraged to share their work post-activity.      Patient did not attend OT group session despite personal invitation.      03/10/2020  Landen Knoedler, MOT, OTR/L   

## 2020-03-10 NOTE — [facility] Group Notes (Signed)
The focus of this group is to help patients establish daily goals to achieve during treatment and discuss how the patient can incorporate goal setting into their daily lives to aide in recovery.  Pt did not attend group 

## 2020-03-10 NOTE — Progress Notes (Signed)
   03/10/20 1505  Vital Signs  Temp 98 F (36.7 C)  Temp Source Oral  Pulse Rate 93  Pulse Rate Source Dinamap  Resp 18  BP 132/84  BP Location Left Arm  BP Method Automatic  Patient Position (if appropriate) Sitting  Oxygen Therapy  SpO2 100 %  Pain Assessment  Pain Scale 0-10  Pain Score 0   D: Patient denies SI/HI/AVH. Pt. In the morning refused to talk and  Was guarded. Pt. Isolated in her room in the morning. Pt came out in the late afternoon and wanted to go home.  A:  Patient took scheduled medicine.  Support and encouragement provided Routine safety checks conducted every 15 minutes. Patient  Informed to notify staff with any concerns.   R: Safety maintained.

## 2020-03-10 NOTE — Progress Notes (Signed)
Recreation Therapy Notes  Date: 2.3.22 Time: 0945 Location: [address] Dayroom  Group Topic: Communication, Team Building, Problem Solving  Goal Area(s) Addresses:  Patient will effectively work with peer towards shared goal.  Patient will identify skill used to make activity successful.  Patient will identify how skills used during activity can be used to reach post d/c goals.   Intervention: STEM Activity   Activity:  In groups, patients were given 12 pipe cleaner to create the tallest free standing tower.  Patients were to work together to create a tower that could stand on its own.   Education: Pharmacist, community, Building control surveyor.   Education Outcome: Acknowledges education  Clinical Observations/Feedback:  Pt did not attend group session.    Caroll Rancher, LRT/CTRS         Caroll Rancher A 03/10/2020 12:03 PM

## 2020-03-10 NOTE — Progress Notes (Signed)
   03/10/20 0700  Sleep  Number of Hours 6.25

## 2020-03-10 NOTE — Progress Notes (Signed)
Pt very suspicious and paranoid , especially about taking her medications. Pt did take her HS medications after much prompting.      03/10/20 0000  Psych Admission Type (Psych Patients Only)  Admission Status Involuntary  Psychosocial Assessment  Patient Complaints Suspiciousness  Eye Contact Darting  Facial Expression Worried  Affect Constricted  Speech Slow  Interaction No initiation;Minimal;Guarded;Isolative  Motor Activity Slow  Appearance/Hygiene Unremarkable  Behavior Characteristics Guarded  Mood Suspicious  Thought Process  Coherency Blocking;Disorganized  Content Preoccupation  Delusions None reported or observed  Perception Derealization  Hallucination None reported or observed  Judgment Poor  Confusion Moderate  Danger to Self  Current suicidal ideation? Denies  Danger to Others  Danger to Others None reported or observed

## 2020-03-11 LAB — URINALYSIS, COMPLETE (UACMP) WITH MICROSCOPIC
Bilirubin Urine: NEGATIVE
Glucose, UA: NEGATIVE mg/dL
Hgb urine dipstick: NEGATIVE
Ketones, ur: NEGATIVE mg/dL
Leukocytes,Ua: NEGATIVE
Nitrite: NEGATIVE
Protein, ur: NEGATIVE mg/dL
Specific Gravity, Urine: 1.017 (ref 1.005–1.030)
pH: 6 (ref 5.0–8.0)

## 2020-03-11 MED ORDER — ZIPRASIDONE MESYLATE 20 MG IM SOLR: 20.0000 mg | INTRAMUSCULAR | Status: DC | PRN

## 2020-03-11 MED ORDER — OLANZAPINE 5 MG PO TBDP: 5.0000 mg | ORAL_TABLET | Freq: Three times a day (TID) | ORAL | Status: DC | PRN

## 2020-03-11 MED ORDER — LORAZEPAM 1 MG PO TABS: 1.0000 mg | ORAL_TABLET | ORAL | Status: DC | PRN

## 2020-03-11 NOTE — Progress Notes (Signed)
[facility] MD Progress Note  Andrea Pugh  MRN:  [id number]  03/11/20  Chief Complaint: paranoia and psychosis  Subjective:  Andrea Pugh is a 24y/o female with past psychiatric history of MDD with psychotic features, anxiety, previous cutting behaviors, bipolar d/o with psychotic features, and multiple past psychiatric hospitalizations, who was admitted after being brought in by police for bizarre behaviors. Prior to admission she was reportedly found by police driving around looking for someone she thought had been shot. She then told police she was driving to her Uncle's but provided an address that did not exist. Her records state that while at the [facility] she was reporting VH of seeing demons, had thought blocking and disorganized behaviors, and attempted to elope.The patient is currently on Hospital Day 3.  Chart Review from last 24 hours:  The patient's chart was reviewed and nursing notes were reviewed. The patient's case was discussed in multidisciplinary team meeting. Per nursing she was hyper-religous with delusional thought on night shift. She was anxious, guarded, and isolative to her room. She did not attend groups. Per MAR she was compliant with scheduled medications and did not require PRN medications.   Information Obtained Today During Patient Interview: The patient was seen and evaluated on the unit. She is more conversant today but still has periods of thought blocking on exam. She states she does not recall events that occurred prior to admission and denies having AVH, ideas of reference, or first rank symptoms. She states her mood "is normal" but cannot give additional details. She reports improvement in her anxiety. She reports good sleep and appetite. She denies SI or HI. She cannot tell me her doctor's name, and she does not recall her most recent psychotropic medications.She knows she stopped her medications in the last 2 months when she ran out of money. She denies currently  feeling depressed but is unable to give specifics as to depressive symptoms prior to admission. She voices no physical complaints and specifically denies dysuria, urinary hesitancy/urgency, or frequency. She denies medication side-effects. She derails during the interview to say she "missed an appointment today" and with prompting was able to recall that this appointment was with her therapist. She could not tell me therapist's name.  Principal Problem: Schizophrenia spectrum disorder with psychotic disorder type not yet determined (HCC) Diagnosis: Principal Problem:   Schizophrenia spectrum disorder with psychotic disorder type not yet determined (HCC)  Total Time Spent in Direct Patient Care:  I personally spent 30 minutes on the unit in direct patient care. The direct patient care time included face-to-face time with the patient, reviewing the patient's chart, communicating with other professionals, and coordinating care. Greater than 50% of this time was spent in counseling or coordinating care with the patient regarding goals of hospitalization, psycho-education, and discharge planning needs.  Past Psychiatric History: see admission H&P  Past Medical History: see admission H&P  Family History: see admission H&P  Family Psychiatric  History: see admission H&P  Social History: works at a [employer]; graduated from college and living with a roommate in [place]; denies drug or alcohol use  Sleep: Good per patient report  Appetite: Good per patient report  Current Medications: Current Facility-Administered Medications  Medication Dose Route Frequency Provider Last Rate Last Admin  . acetaminophen (TYLENOL) tablet 650 mg  650 mg Oral Q6H PRN Estella Husk, MD      . alum & mag hydroxide-simeth (MAALOX/MYLANTA) 200-200-20 MG/5ML suspension 30 mL  30 mL Oral Q4H PRN Bronwen Betters,  Philmore Pali, MD      . benztropine (COGENTIN) tablet 0.5 mg  0.5 mg Oral BID Estella Husk, MD    0.5 mg at 03/11/20 0750  . escitalopram (LEXAPRO) tablet 10 mg  10 mg Oral Daily Comer Locket, MD   10 mg at 03/11/20 0750  . hydrOXYzine (ATARAX/VISTARIL) tablet 25 mg  25 mg Oral TID PRN Estella Husk, MD      . magnesium hydroxide (MILK OF MAGNESIA) suspension 30 mL  30 mL Oral Daily PRN Estella Husk, MD      . risperiDONE (RISPERDAL M-TABS) disintegrating tablet 1 mg  1 mg Oral Daily Mason Jim, Knut Rondinelli E, MD   1 mg at 03/11/20 0750  . risperiDONE (RISPERDAL M-TABS) disintegrating tablet 2 mg  2 mg Oral QHS Bartholomew Crews E, MD   2 mg at 03/10/20 2059  . traZODone (DESYREL) tablet 50 mg  50 mg Oral QHS PRN Aldean Baker, NP       Lab Results:  Results for orders placed or performed during the hospital encounter of 03/08/20 (from the past 48 hour(s))  Hepatic function panel     Status: None   Collection Time: 03/10/20  6:24 AM  Result Value Ref Range   Total Protein 8.0 6.5 - 8.1 g/dL   Albumin 4.4 3.5 - 5.0 g/dL   AST 17 15 - 41 U/L   ALT 27 0 - 44 U/L   Alkaline Phosphatase 68 38 - 126 U/L   Total Bilirubin 0.9 0.3 - 1.2 mg/dL   Bilirubin, Direct <1.6 0.0 - 0.2 mg/dL   Indirect Bilirubin NOT CALCULATED 0.3 - 0.9 mg/dL    Comment: Performed at [facility], [address], [place], [place] [address]  CBC with Differential/Platelet     Status: Abnormal   Collection Time: 03/10/20  6:24 AM  Result Value Ref Range   WBC 6.3 4.0 - 10.5 K/uL   RBC 4.07 3.87 - 5.11 MIL/uL   Hemoglobin 12.7 12.0 - 15.0 g/dL   HCT 45.4 09.8 - 11.9 %   MCV 90.7 80.0 - 100.0 fL   MCH 31.2 26.0 - 34.0 pg   MCHC 34.4 30.0 - 36.0 g/dL   RDW 14.7 82.9 - 56.2 %   Platelets 411 (H) 150 - 400 K/uL   nRBC 0.0 0.0 - 0.2 %   Neutrophils Relative % 57 %   Neutro Abs 3.6 1.7 - 7.7 K/uL   Lymphocytes Relative 34 %   Lymphs Abs 2.1 0.7 - 4.0 K/uL   Monocytes Relative 9 %   Monocytes Absolute 0.6 0.1 - 1.0 K/uL   Eosinophils Relative 0 %   Eosinophils Absolute 0.0 0.0 - 0.5 K/uL    Basophils Relative 0 %   Basophils Absolute 0.0 0.0 - 0.1 K/uL   Immature Granulocytes 0 %   Abs Immature Granulocytes 0.02 0.00 - 0.07 K/uL    Comment: Performed at [facility], [address], [place], [place] [address]   Blood Alcohol level:  Lab Results  Component Value Date   [facility] <10 03/07/2020   ETH <10 04/20/2019   Metabolic Disorder Labs: Lab Results  Component Value Date   HGBA1C 6.0 (H) 03/07/2020   MPG 125.5 03/07/2020   MPG 114.02 04/02/2019   Lab Results  Component Value Date   PROLACTIN 66.1 (H) 04/13/2019   Lab Results  Component Value Date   CHOL 187 03/07/2020   TRIG 49 03/07/2020   HDL  58 03/07/2020   CHOLHDL 3.2 03/07/2020   VLDL 10 03/07/2020   LDLCALC 119 (H) 03/07/2020   LDLCALC 113 (H) 04/02/2019   Physical Findings: AIMS: Facial and Oral Movements Muscles of Facial Expression: None, normal Lips and Perioral Area: None, normal Jaw: None, normal Tongue: None, normal,Extremity Movements Upper (arms, wrists, hands, fingers): None, normal Lower (legs, knees, ankles, toes): None, normal, Trunk Movements Neck, shoulders, hips: None, normal, Overall Severity Severity of abnormal movements (highest score from questions above): None, normal Incapacitation due to abnormal movements: None, normal Patient's awareness of abnormal movements (rate only patient's report): No Awareness, Dental Status Current problems with teeth and/or dentures?: No Does patient usually wear dentures?: No      Musculoskeletal: Strength & Muscle Tone: uncooperative for testing Gait & Station: uncooperative for testing Patient leans: N/A  Psychiatric Specialty Exam: Physical Exam Vitals reviewed.  Constitutional:      Appearance: Normal appearance.  HENT:     Head: Normocephalic.  Pulmonary:     Effort: Pulmonary effort is normal.     Review of Systems  Constitutional: Negative for fever.  Respiratory: Negative for cough and shortness of  breath.   Cardiovascular: Negative for chest pain.  Gastrointestinal: Negative for nausea and vomiting.    Blood pressure 117/81, pulse (!) 107, temperature 98.5 F (36.9 C), temperature source Oral, resp. rate 18, height 5\' 5"  (1.651 m), weight 97.1 kg, SpO2 98 %.Body mass index is 35.61 kg/m.  General Appearance: improved hygiene, sitting outside her room in hallway; minimal engagement with examiner  Eye Contact:  Minimal  Speech:  Periods of halting speech at times but overall more conversant today; normal fluency when she speaks and clear and coherent speech when speaking  Volume:  Decreased  Mood: described as "normal" - appears anxious and guarded  Affect:  Restricted, guarded  Thought Process:  Concrete with periods of thought blocking  Orientation:  Would not cooperate for questioning  Thought Content:  Denied AVH, ideas of reference, or first rank symptoms; has periods of thought blocking to suggest response to internal stimuli at points during exam  Suicidal Thoughts:  Denied  Homicidal Thoughts:  Denied  Memory:  Limited secondary to psychosis  Judgement:  Impaired  Insight:  Lacking  Psychomotor Activity:  Decreased, no tremors  Concentration:  Concentration: Poor  Recall:  Poor  Fund of Knowledge:  Poor  Language:  Fair  Akathisia:  Negative  Assets:  Desire for Improvement Resilience  ADL's:  Impaired  Cognition:  Impaired,  Mild  Sleep:  Number of Hours: 6.5   Treatment Plan Summary:  ASSESSMENT: Diagnoses / Active Problems: Unspecified schizophrenia spectrum and other psychotic d/o (r/o MDD with psychotic features, r/o bipolar with psychotic features, r/o schizoaffective d/o)  PLAN: 1. Safety and Monitoring:  -- Involuntary admission to inpatient psychiatric unit for safety, stabilization and treatment  -- Daily contact with patient to assess and evaluate symptoms and progress in treatment  -- Patient's case to be discussed in multi-disciplinary team  meeting  -- Observation Level : q15 minute checks  -- Vital signs:  q12 hours  -- Precautions: suicide and elopement  2. Psychiatric Diagnoses and Treatment:  Unspecified schizophrenia spectrum and other psychotic d/o (r/o MDD with psychotic features, r/o bipolar with psychotic features, r/o schizoaffective d/o)  -- Continue Risperdal M tabs 1mg  po qam and 2mg  po qhs and monitor for clinical improvement with recent dose increase - titrate up as indicated -- Continue Cogentin 0.5mg  bid -- Continue Lexapro  10mg  daily in the event this is related to MDD with psychosis and to help with reported anxiety -- Discontinued Prazosin 1mg  qhs in the event this is contributing to drowsiness in combination with antipsychotic - compliance with med prior to admission is also questionable -- Discontinued Omega 3 due to reduce polypharmacy given her recent paranoia with medications and refusal of med  -- Metabolic profile and EKG monitoring obtained while on an atypical antipsychotic (BMI:35.61; Lipid Panel:cholesterol 187, HDL 58, LDL 119, triglycerides 49; HbgA1c:6.0 QTc:457)   -- Encouraged patient to participate in unit milieu and in scheduled group therapies   -- Short Term Goals: Ability to identify changes in lifestyle to reduce recurrence of condition will improve and Ability to demonstrate self-control will improve  -- Long Term Goals: Improvement in symptoms so as ready for discharge  3. Medical Issues Being Addressed:   Questionable clean catch contaminant on UA  -- Repeat clean catch UA and urine culture pending when she will comply with giving specimen - currently asymptomatic  Elevated total bilirubin on admission (1.7) - resolved  -- Hepatic function panel on 03/10/20 shows total bili 0.9 and remainder of panel WNL   Thrombocytosis (platelets 422)  -- Repeat CBC on 03/10/20 shows platelets 411 with remainder of CBC WNL  -- will need f/u with PCP after discharge for trending and f/u as an  outpatient  4. Discharge Planning:   -- Social work and case management to assist with discharge planning and identification of hospital follow-up needs prior to discharge  -- Estimated LOS: 5-7 days  -- Discharge Concerns: Need to establish a safety plan; Medication compliance and effectiveness  -- Discharge Goals: Return home with outpatient referrals for mental health follow-up including medication management/psychotherapy  Comer Locket, MD, FAPA 03/11/2020, 8:12 AM

## 2020-03-11 NOTE — Progress Notes (Signed)
   03/11/20 2040  COVID-19 Daily Checkoff  If you have had runny nose, nasal congestion, sneezing in the past 24 hours, has it worsened? No  COVID-19 EXPOSURE  Have you traveled outside the state in the past 14 days? No  Have you been in contact with someone with a confirmed diagnosis of COVID-19 or PUI in the past 14 days without wearing appropriate PPE? No  Have you been living in the same home as a person with confirmed diagnosis of COVID-19 or a PUI (household contact)? No  Have you been diagnosed with COVID-19? No

## 2020-03-11 NOTE — Progress Notes (Signed)
Recreation Therapy Notes  Date: 2.4.22 Time: 1000 Location: 500 Hall Dayroom  Group Topic: Stress Management  Goal Area(s) Addresses:  Patient will identify positive stress management techniques. Patient will identify benefits of using stress management post d/c.  Behavioral Response: Engaged  Intervention: Stress Management  Activity: Mindful Walking.  LRT played a meditation that took patients on a imaginary peaceful walk.  Patients were to envision all of the muscles working as they took their walk.  Patients were to also clear their minds as they pictured being in a peaceful setting.    Education:  Stress Management, Discharge Planning.   Education Outcome: Acknowledges Education  Clinical Observations/Feedback: Pt came in for the last 15 minutes of group.  Pt was able to sit quietly and engage in the meditation.  Pt was quiet and appropriate during group session.    Caroll Rancher, LRT/CTRS         Caroll Rancher A 03/11/2020 11:51 AM

## 2020-03-11 NOTE — Progress Notes (Signed)
   03/11/20 2040  Psych Admission Type (Psych Patients Only)  Admission Status Involuntary  Psychosocial Assessment  Patient Complaints None  Eye Contact Fair  Facial Expression Anxious  Affect Anxious;Preoccupied  Speech Slow  Interaction Avoidant  Motor Activity Slow  Appearance/Hygiene Unremarkable  Behavior Characteristics Cooperative;Appropriate to situation  Mood Anxious;Pleasant  Thought Process  Coherency Blocking;Disorganized;Tangential  Content Obsessions;Magical thinking;Preoccupation  Delusions None reported or observed  Perception WDL  Hallucination None reported or observed  Judgment Impaired  Confusion Moderate  Danger to Self  Current suicidal ideation? Denies  Danger to Others  Danger to Others None reported or observed

## 2020-03-11 NOTE — Plan of Care (Signed)
Continues to experience delusional thoughts. Hyper religious  and preoccupied. Anxious  and restless. Patient was mostly in room. Received bedtime medications and went back to her room. Had no concern. Currently in bed sleeping. Safety precautions maintained.

## 2020-03-11 NOTE — [facility] Group Notes (Signed)
[facility] LCSW Group Therapy  Type of Therapy/Topic: Identifying Irrational Beliefs/Thoughts  Participation Level: None  Description of Group: The purpose of this group is to assist patients in learning to identify irrational beliefs and thoughts that contribute to their negative emotions and experience positive emotions. Patients will be guided to discuss ways in which they have been effected by irrational thoughts and beliefs and how to transform those irrational beliefs into rational ones. Newly identified rational beliefs will be juxtaposed with experiences of positive emotions or situations, and patients will be challenged to use rational beliefs or thoughts to combat negative ones. Special emphasis will be placed on coping with irrational beliefs in conflict situations, and patients will process healthy conflict resolution skills.  Therapeutic Goals: 1. Patient will identify two irrational thoughts or beliefs  to reflect on in order to balance out those thoughts 2. Patient will label two or more irrational thoughts/beliefs that they find the most difficult to cope with 3. Patient will demonstrate positive conflict resolution skills through discussion and/or role plays that will assist in transforming irrational thoughts or beliefs into positive ones.  Summary of Patient Progress: Patient attended group, however did not participate and left group towards the end.    Therapeutic Modalities: Cognitive Behavioral Therapy Feelings Identification Dialectical Behavioral Therapy   Ruthann Cancer MSW, LCSW Clincal Social Worker  [facility]

## 2020-03-11 NOTE — Progress Notes (Signed)
   03/11/20 0642  Vital Signs  Temp 98.5 F (36.9 C)  Temp Source Oral  Pulse Rate (!) 109  Pulse Rate Source Monitor  Resp 18  BP 136/81  BP Location Right Arm  BP Method Automatic  Patient Position (if appropriate) Sitting  Oxygen Therapy  SpO2 98 %  O2 Device Room Air    D: Patient denies SI/HI/AVH. Patient rated anxiety 2/10 and denied depression. Pt. Was isolative to her room. A:  Patient took scheduled medicine.  Support and encouragement provided Routine safety checks conducted every 15 minutes. Patient  Informed to notify staff with any concerns.   R: Safety maintained.

## 2020-03-12 DIAGNOSIS — F22 Delusional disorders: Secondary | ICD-10-CM

## 2020-03-12 DIAGNOSIS — F312 Bipolar disorder, current episode manic severe with psychotic features: Secondary | ICD-10-CM

## 2020-03-12 NOTE — [facility] Group Notes (Signed)
  [facility] LCSW Group Therapy Note  Date/Time:  03/12/2020 11:15AM-12:00PM  Type of Therapy and Topic:  Group Therapy:  Feelings About Hospitalization  Participation Level:  Did Not Attend   Description of Group This process group involved patients discussing their feelings related to being hospitalized, as well as the benefits they see to being in the hospital.  These feelings and benefits were itemized.  The group then brainstormed specific ways in which they could seek those same benefits when they discharge and return home.  Therapeutic Goals 1. Patient will identify and describe positive and negative feelings related to hospitalization 2. Patient will verbalize benefits of hospitalization to themselves personally 3. Patients will brainstorm together ways they can obtain similar benefits in the outpatient setting, identify barriers to wellness and possible solutions  Summary of Patient Progress:  The patient was invited to group, did not attend.  Therapeutic Modalities Cognitive Behavioral Therapy Motivational Interviewing    Ambrose Mantle, LCSW 03/12/2020, 1:44 PM

## 2020-03-12 NOTE — Progress Notes (Signed)
   03/12/20 0900  Psych Admission Type (Psych Patients Only)  Admission Status Involuntary  Psychosocial Assessment  Patient Complaints None  Eye Contact Fair  Facial Expression Anxious  Affect Preoccupied  Speech Slow  Interaction Forwards little;Cautious  Motor Activity Slow  Appearance/Hygiene Unremarkable  Behavior Characteristics Appropriate to situation;Cooperative  Mood Anxious  Thought Process  Coherency Blocking;Disorganized;Tangential  Content Preoccupation  Delusions None reported or observed  Perception WDL  Hallucination None reported or observed  Judgment Impaired  Confusion Moderate  Danger to Self  Current suicidal ideation? Denies  Danger to Others  Danger to Others None reported or observed   Orders reviewed. Vital signs reviewed. Verbal support provided. 15 minute checks performed for safety.   Pt compliant with treatment plan and denies any medication side effects.

## 2020-03-12 NOTE — Progress Notes (Addendum)
[facility] MD Progress Note  Andrea Pugh  MRN:  [id number]  03/12/20  Chief Complaint: paranoia and psychosis  Subjective:  Andrea Pugh is a 25y/o female with past psychiatric history of MDD with psychotic features, anxiety, previous cutting behaviors, bipolar d/o with psychotic features, and multiple past psychiatric hospitalizations, who was admitted after being brought in by police for bizarre behaviors. Prior to admission she was reportedly found by police driving around looking for someone she thought had been shot. She then told police she was driving to her Uncle's but provided an address that did not exist. Her records state that while at the [facility] she was reporting VH of seeing demons, had thought blocking and disorganized behaviors, and attempted to elope.The patient is currently on Hospital Day 5.  Chart Review from last 24 hours:  The patient's chart was reviewed and nursing notes were reviewed. The patient's case was discussed in multidisciplinary team meeting. Per nursing she was hyper-religous with delusional thought on night shift. She was anxious, guarded, and isolative to her room. She did not attend groups. Per MAR she was compliant with scheduled medications and did not require PRN medications.   Information Obtained Today During Patient Interview: The patient was seen and evaluated on the unit. She is in her room, asleep after lunch. She does not add much detail to the conversation, her answers are vague and brief. She does appear to be thought blocking.  She speaks in a low volume and her eye contact is fair. She states she does not recall events that occurred prior to admission and denies having AVH and ideas of reference. She states her mood "is normal and happy." Her affect is non-congruent, she appears guarded with depressed, flat affect.  She denies currently feeling depressed. She reports improvement in her anxiety, she does not appear anxious. She reports good sleep and  appetite. She slept 5.5 hours. She denies SI or HI. She stated she does not like attending group and has "done all of that before." She voices no physical complaints and specifically denies dysuria, urinary hesitancy/urgency, or frequency. She denies medication side-effects. Will continue to monitor for safety and offer support and encouragement.   Principal Problem: Schizophrenia spectrum disorder with psychotic disorder type not yet determined (HCC) Diagnosis: Principal Problem:   Schizophrenia spectrum disorder with psychotic disorder type not yet determined (HCC)  Total Time Spent in Direct Patient Care:  I personally spent 30 minutes on the unit in direct patient care. The direct patient care time included face-to-face time with the patient, reviewing the patient's chart, communicating with other professionals, and coordinating care. Greater than 50% of this time was spent in counseling or coordinating care with the patient regarding goals of hospitalization, psycho-education, and discharge planning needs.  Past Psychiatric History: see admission H&P  Past Medical History: see admission H&P  Family History: see admission H&P  Family Psychiatric  History: see admission H&P  Social History: works at a [employer]; graduated from college and living with a roommate in [place]; denies drug or alcohol use  Sleep: Good per patient report  Appetite: Good per patient report  Current Medications: Current Facility-Administered Medications  Medication Dose Route Frequency Provider Last Rate Last Admin  . acetaminophen (TYLENOL) tablet 650 mg  650 mg Oral Q6H PRN Estella Husk, MD      . alum & mag hydroxide-simeth (MAALOX/MYLANTA) 200-200-20 MG/5ML suspension 30 mL  30 mL Oral Q4H PRN Estella Husk, MD      .  benztropine (COGENTIN) tablet 0.5 mg  0.5 mg Oral BID Estella Husk, MD   0.5 mg at 03/12/20 0932  . escitalopram (LEXAPRO) tablet 10 mg  10 mg Oral Daily  Mason Jim, Amy E, MD   10 mg at 03/12/20 0931  . hydrOXYzine (ATARAX/VISTARIL) tablet 25 mg  25 mg Oral TID PRN Estella Husk, MD      . OLANZapine zydis (ZYPREXA) disintegrating tablet 5 mg  5 mg Oral Q8H PRN Comer Locket, MD       And  . LORazepam (ATIVAN) tablet 1 mg  1 mg Oral PRN Comer Locket, MD       And  . ziprasidone (GEODON) injection 20 mg  20 mg Intramuscular PRN Mason Jim, Amy E, MD      . magnesium hydroxide (MILK OF MAGNESIA) suspension 30 mL  30 mL Oral Daily PRN Estella Husk, MD      . risperiDONE (RISPERDAL M-TABS) disintegrating tablet 1 mg  1 mg Oral Daily Bartholomew Crews E, MD   1 mg at 03/12/20 0931  . risperiDONE (RISPERDAL M-TABS) disintegrating tablet 2 mg  2 mg Oral QHS Bartholomew Crews E, MD   2 mg at 03/11/20 2036  . traZODone (DESYREL) tablet 50 mg  50 mg Oral QHS PRN Aldean Baker, NP       Lab Results:  No results found for this or any previous visit (from the past 48 hour(s)). Blood Alcohol level:  Lab Results  Component Value Date   ETH <10 03/07/2020   ETH <10 04/20/2019   Metabolic Disorder Labs: Lab Results  Component Value Date   HGBA1C 6.0 (H) 03/07/2020   MPG 125.5 03/07/2020   MPG 114.02 04/02/2019   Lab Results  Component Value Date   PROLACTIN 66.1 (H) 04/13/2019   Lab Results  Component Value Date   CHOL 187 03/07/2020   TRIG 49 03/07/2020   HDL 58 03/07/2020   CHOLHDL 3.2 03/07/2020   VLDL 10 03/07/2020   LDLCALC 119 (H) 03/07/2020   LDLCALC 113 (H) 04/02/2019   Physical Findings: AIMS: Facial and Oral Movements Muscles of Facial Expression: None, normal Lips and Perioral Area: None, normal Jaw: None, normal Tongue: None, normal,Extremity Movements Upper (arms, wrists, hands, fingers): None, normal Lower (legs, knees, ankles, toes): None, normal, Trunk Movements Neck, shoulders, hips: None, normal, Overall Severity Severity of abnormal movements (highest score from questions above): None,  normal Incapacitation due to abnormal movements: None, normal Patient's awareness of abnormal movements (rate only patient's report): No Awareness, Dental Status Current problems with teeth and/or dentures?: No Does patient usually wear dentures?: No      Musculoskeletal: Strength & Muscle Tone: uncooperative for testing Gait & Station: uncooperative for testing Patient leans: N/A  Psychiatric Specialty Exam: Physical Exam Vitals reviewed.  Constitutional:      Appearance: Normal appearance.  HENT:     Head: Normocephalic.  Pulmonary:     Effort: Pulmonary effort is normal.  Musculoskeletal:        General: Normal range of motion.     Cervical back: Normal range of motion.  Neurological:     Mental Status: She is oriented to person, place, and time.  Psychiatric:        Attention and Perception: Attention normal.        Mood and Affect: Mood is depressed. Affect is flat.        Speech: Speech is delayed.     Review of Systems  Constitutional:  Negative for fever.  Respiratory: Negative for cough and shortness of breath.   Cardiovascular: Negative for chest pain.  Gastrointestinal: Negative for nausea and vomiting.    Blood pressure 126/71, pulse (!) 124, temperature 98.9 F (37.2 C), temperature source Oral, resp. rate 18, height 5\' 5"  (1.651 m), weight 97.1 kg, SpO2 99 %.Body mass index is 35.61 kg/m.  General Appearance: improved hygiene, sitting outside her room in hallway; minimal engagement with examiner  Eye Contact:  Fair  Speech:  Periods of halting speech at times but overall more conversant today; normal fluency when she speaks and clear and coherent speech when speaking  Volume:  Decreased  Mood: described as "normal, happy" - appears somewhat guarded  Affect:  Non-congruent, depressed, flat, Restricted, guarded  Thought Process:  Concrete, linear, description of associations;intact  Orientation:  Alert & oriented x 3  Thought Content:  Denied AVH, ideas of  reference  Suicidal Thoughts:  Denied  Homicidal Thoughts:  Denied  Memory:  Limited secondary to psychosis  Judgement:  Impaired  Insight:  Lacking  Psychomotor Activity:  Decreased, no tremors  Concentration:  Concentration: Poor  Recall:  Poor  Fund of Knowledge:  Poor  Language:  Fair  Akathisia:  Negative  Assets:  Desire for Improvement Resilience  ADL's:  Impaired  Cognition:  Impaired,  Mild  Sleep:  Number of Hours: 5.5   Treatment Plan Summary:  ASSESSMENT: Diagnoses / Active Problems: Unspecified schizophrenia spectrum and other psychotic d/o (r/o MDD with psychotic features, r/o bipolar with psychotic features, r/o schizoaffective d/o)  PLAN: 1. Safety and Monitoring:  -- Involuntary admission to inpatient psychiatric unit for safety, stabilization and treatment  -- Daily contact with patient to assess and evaluate symptoms and progress in treatment  -- Patient's case to be discussed in multi-disciplinary team meeting  -- Observation Level : q15 minute checks  -- Vital signs:  q12 hours  -- Precautions: suicide and elopement  2. Psychiatric Diagnoses and Treatment:  Unspecified schizophrenia spectrum and other psychotic d/o (r/o MDD with psychotic features, r/o bipolar with psychotic features, r/o schizoaffective d/o)  -- Continue Risperdal M tabs 1mg  po qam and 2mg  po qhs and monitor for clinical improvement with recent dose increase - titrate up as indicated -- Continue Cogentin 0.5mg  bid -- Continue Lexapro 10mg  daily in the event this is related to MDD with psychosis and to help with reported anxiety -- 03/10/2020 Discontinued Prazosin 1mg  qhs in the event this is contributing to drowsiness in combination with antipsychotic - compliance with med prior to admission is also questionable -- Discontinued Omega 3 due to reduce polypharmacy given her recent paranoia with medications and refusal of med  -- Metabolic profile and EKG monitoring obtained while on an  atypical antipsychotic (BMI:35.61; Lipid Panel:cholesterol 187, HDL 58, LDL 119, triglycerides 49; HbgA1c:6.0 QTc:457)   -- Encouraged patient to participate in unit milieu and in scheduled group therapies   -- Short Term Goals: Ability to identify changes in lifestyle to reduce recurrence of condition will improve and Ability to demonstrate self-control will improve  -- Long Term Goals: Improvement in symptoms so as ready for discharge  3. Medical Issues Being Addressed:   Questionable clean catch contaminant on UA  -- Repeat clean catch UA and urine culture pending when she will comply with giving specimen - currently asymptomatic   Elevated total bilirubin on admission (1.7) - resolved  -- Hepatic function panel on 03/10/20 shows total bili 0.9 and remainder of panel WNL  Thrombocytosis (platelets 422)  -- Repeat CBC on 03/10/20 shows platelets 411 with remainder of CBC WNL  -- will need f/u with PCP after discharge for trending and f/u as an outpatient  4. Discharge Planning:   -- Social work and case management to assist with discharge planning and identification of hospital follow-up needs prior to discharge  -- Estimated LOS: 5-7 days  -- Discharge Concerns: Need to establish a safety plan; Medication compliance and effectiveness  -- Discharge Goals: Return home with outpatient referrals for mental health follow-up including medication management/psychotherapy  Laveda Abbe, NP, PMHNP 03/12/2020, 2:57 PM

## 2020-03-12 NOTE — Progress Notes (Signed)
   03/12/20 0500  Sleep  Number of Hours 5.5

## 2020-03-13 LAB — URINE CULTURE

## 2020-03-13 NOTE — Progress Notes (Signed)
   03/12/20 2030  Psychosocial Assessment  Patient Complaints None  Eye Contact Fair  Facial Expression Flat  Affect Preoccupied  Speech Slow  Interaction Cautious  Motor Activity Slow  Appearance/Hygiene Unremarkable  Behavior Characteristics Appropriate to situation;Cooperative  Mood Pleasant  Thought Process  Coherency Disorganized;Tangential  Content Preoccupation  Delusions None reported or observed  Perception WDL  Hallucination None reported or observed  Judgment Impaired  Confusion Mild  Danger to Self  Current suicidal ideation? Denies  Danger to Others  Danger to Others None reported or observed

## 2020-03-13 NOTE — Progress Notes (Signed)
Adult Psychoeducational Group Note  Date:  03/13/2020 Time:  12:38 AM  Group Topic/Focus:  Wrap-Up Group:   The focus of this group is to help patients review their daily goal of treatment and discuss progress on daily workbooks.  Participation Level:  Active  Participation Quality:  Appropriate  Affect:  Appropriate  Cognitive:  Appropriate  Insight: Appropriate  Engagement in Group:  Limited  Modes of Intervention:  Discussion  Additional Comments:  Pt stated her goal for today was to focus on her treatment plan. Pt stated she felt she accomplished her goal today. Pt stated she talk with her social worker but did not get a chance to talk with her doctor today, regarding her care today. Pt stated she took all her medication today from her providers. Pt stated her relationship with her family and support team has improved since she was admitted here. Pt rated her overall day a 8 out of 10 today. Pt stated she felt better about herself today. Pt stated her appetite was pretty good today and she attend all meals today. Pt stated her sleep last night was good. Pt stated the goal for tonight was to get some rest. Pt stated she was in no physical pain. Pt deny auditory or visual hallucinations. Pt denies thoughts of harming herself or others. Pt stated she would alert staff if anything changes.  Felipa Furnace 03/13/2020, 12:38 AM

## 2020-03-13 NOTE — Progress Notes (Addendum)
[facility] MD Progress Note  Brett Soza  MRN:  [id number]  03/13/20  Chief Complaint: paranoia and psychosis  Subjective:  Noni Stonesifer is a 25y/o female with past psychiatric history of MDD with psychotic features, anxiety, previous cutting behaviors, bipolar d/o with psychotic features, and multiple past psychiatric hospitalizations, who was admitted after being brought in by police for bizarre behaviors. Prior to admission she was reportedly found by police driving around looking for someone she thought had been shot. She then told police she was driving to her Uncle's but provided an address that did not exist. Her records state that while at the [facility] she was reporting VH of seeing demons, had thought blocking and disorganized behaviors, and attempted to elope.The patient is currently on Hospital Day 5.  Information Obtained Today During Patient Interview:  24 hours Nursing notes  were reviewed, Mars and  V/S were reviewed also.  Today patient was seen in her room sleeping but willingly followed provider to the office for daily .assessment.  Patient stated that she came to the unit for panic attack and dealing with "personal life"  Patient admits that she has sleeping much and not participating in group activities. She rationalized not participating in group because she does not get much out of it from her previous hospitalization.  Patient reports the best sleep than she was getting before coming to the hospital.  Patient also reports good appetite.  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  Patient made minimal eye contact with provider.  Overall she admits she is feeling much better.    She denied SI/HI/AVH AA and no mention of paranoia.  She has not experienced panic attack since admission and she does not know what her discharge is at this time. Shortly after our interaction she was seen in music therapy with peers in the day room. We discussed  the need to engage in outpatient treatment and therapy to be able to remain out of the hospital. Principal Problem: Schizophrenia spectrum disorder with psychotic disorder type not yet determined (HCC)  Diagnosis: Principal Problem:   Schizophrenia spectrum disorder with psychotic disorder type not yet determined (HCC)  Total Time Spent in Direct Patient Care:  I personally spent 20 minutes on the unit in direct patient care. The direct patient care time included face-to-face time with the patient, reviewing the patient's chart, communicating with other professionals, and coordinating care. Greater than 50% of this time was spent in counseling or coordinating care with the patient regarding goals of hospitalization, psycho-education, and discharge planning needs.  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  Currently living with a roommate in [place] and  denies drug or alcohol use  Sleep: Good per patient report  Appetite: Good per patient report  Current Medications: Current Facility-Administered Medications  Medication Dose Route Frequency Provider Last Rate Last Admin  . acetaminophen (TYLENOL) tablet 650 mg  650 mg Oral Q6H PRN Estella Husk, MD      . alum & mag hydroxide-simeth (MAALOX/MYLANTA) 200-200-20 MG/5ML suspension 30 mL  30 mL Oral Q4H PRN Estella Husk, MD      . benztropine (COGENTIN) tablet 0.5 mg  0.5 mg  Oral BID Estella Husk, MD   0.5 mg at 03/13/20 0754  . escitalopram (LEXAPRO) tablet 10 mg  10 mg Oral Daily Comer Locket, MD   10 mg at 03/13/20 0754  . hydrOXYzine (ATARAX/VISTARIL) tablet 25 mg  25 mg Oral TID PRN Estella Husk, MD      . OLANZapine zydis (ZYPREXA)  disintegrating tablet 5 mg  5 mg Oral Q8H PRN Comer Locket, MD       And  . LORazepam (ATIVAN) tablet 1 mg  1 mg Oral PRN Comer Locket, MD       And  . ziprasidone (GEODON) injection 20 mg  20 mg Intramuscular PRN Mason Jim, Amy E, MD      . magnesium hydroxide (MILK OF MAGNESIA) suspension 30 mL  30 mL Oral Daily PRN Estella Husk, MD      . risperiDONE (RISPERDAL M-TABS) disintegrating tablet 1 mg  1 mg Oral Daily Mason Jim, Amy E, MD   1 mg at 03/13/20 0754  . risperiDONE (RISPERDAL M-TABS) disintegrating tablet 2 mg  2 mg Oral QHS Bartholomew Crews E, MD   2 mg at 03/12/20 2026  . traZODone (DESYREL) tablet 50 mg  50 mg Oral QHS PRN Aldean Baker, NP       Lab Results:  No results found for this or any previous visit (from the past 48 hour(s)). Blood Alcohol level:  Lab Results  Component Value Date   ETH <10 03/07/2020   ETH <10 04/20/2019   Metabolic Disorder Labs: Lab Results  Component Value Date   HGBA1C 6.0 (H) 03/07/2020   MPG 125.5 03/07/2020   MPG 114.02 04/02/2019   Lab Results  Component Value Date   PROLACTIN 66.1 (H) 04/13/2019   Lab Results  Component Value Date   CHOL 187 03/07/2020   TRIG 49 03/07/2020   HDL 58 03/07/2020   CHOLHDL 3.2 03/07/2020   VLDL 10 03/07/2020   LDLCALC 119 (H) 03/07/2020   LDLCALC 113 (H) 04/02/2019   Physical Findings: AIMS: Facial and Oral Movements Muscles of Facial Expression: None, normal Lips and Perioral Area: None, normal Jaw: None, normal Tongue: None, normal,Extremity Movements Upper (arms, wrists, hands, fingers): None, normal Lower (legs, knees, ankles, toes): None, normal, Trunk Movements Neck, shoulders, hips: None, normal, Overall Severity Severity of abnormal movements (highest score from questions above): None, normal Incapacitation due to abnormal movements: None, normal Patient's awareness of abnormal movements (rate only patient's report): No Awareness, Dental Status Current problems with  teeth and/or dentures?: No Does patient usually wear dentures?: No      Musculoskeletal: Strength & Muscle Tone: uncooperative for testing Gait & Station: uncooperative for testing Patient leans: N/A  Psychiatric Specialty Exam: Physical Exam Vitals reviewed.  Constitutional:      Appearance: Normal appearance.  HENT:     Head: Normocephalic.  Pulmonary:     Effort: Pulmonary effort is normal.  Musculoskeletal:        General: Normal range of motion.     Cervical back: Normal range of motion.  Neurological:     Mental Status: She is oriented to person, place, and time.  Psychiatric:        Attention and Perception: Attention normal.        Mood and Affect: Mood is depressed. Affect is flat.        Speech: Speech is delayed.     Review of Systems  Constitutional: Negative for fever.  Respiratory: Negative for cough  and shortness of breath.   Cardiovascular: Negative for chest pain.  Gastrointestinal: Negative for nausea and vomiting.    Blood pressure 114/79, pulse (!) 119, temperature 98 F (36.7 C), temperature source Oral, resp. rate 20, height 5\' 5"  (1.651 m), weight 97.1 kg, SpO2 96 %.Body mass index is 35.61 kg/m.  General Appearance: improved hygiene, sitting outside her room in hallway; minimal engagement with examiner  Eye Contact:  Fair  Speech:  Periods of halting speech at times but overall more conversant today; normal fluency when she speaks and clear and coherent speech when speaking  Volume:  Decreased  Mood: described as "normal, happy" - appears somewhat guarded  Affect:  Non-congruent, depressed, flat, Restricted, guarded  Thought Process:  Concrete, linear, description of associations;intact  Orientation:  Alert & oriented x 3  Thought Content:  Denied AVH, ideas of reference  Suicidal Thoughts:  Denied  Homicidal Thoughts:  Denied  Memory:  Limited secondary to psychosis  Judgement:  Impaired  Insight:  Lacking  Psychomotor Activity:  Decreased,  no tremors  Concentration:  Concentration: Fair  Recall:  of Knowledge:  Fair  Language:  Fair  Akathisia:  Negative  Assets:  Desire for Improvement Resilience  ADL's:  Impaired  Cognition:  Impaired,  Mild  Sleep:  Number of Hours: 5.75   Treatment Plan Summary:  ASSESSMENT: Diagnoses / Active Problems: Unspecified schizophrenia spectrum and other psychotic d/o (r/o MDD with psychotic features, r/o bipolar with psychotic features, r/o schizoaffective d/o)  PLAN: 1. Safety and Monitoring:  -- Involuntary admission to inpatient psychiatric unit for safety, stabilization and treatment  -- Daily contact with patient to assess and evaluate symptoms and progress in treatment  -- Patient's case to be discussed in multi-disciplinary team meeting  -- Observation Level : q15 minute checks  -- Vital signs:  q12 hours  -- Precautions: suicide and elopement  2. Psychiatric Diagnoses and Treatment:  Unspecified schizophrenia spectrum and other psychotic d/o (r/o MDD with psychotic features, r/o bipolar with psychotic features, r/o schizoaffective d/o)  -- Continue Risperdal M tabs 1mg  po qam and 2mg  po qhs and monitor for clinical improvement with recent dose increase - titrate up as indicated -For Psychosis/mood -- Continue Cogentin 0.5mg  bid -for Medication induced EPS -- Continue Lexapro 10mg  daily in the event this is related to MDD with psychosis and to help with reported anxiety -- 03/10/2020 Discontinued Prazosin 1mg  qhs in the event this is contributing to drowsiness in combination with antipsychotic - compliance with med prior to admission is also questionable -- Discontinued Omega 3 due to reduce polypharmacy given her recent paranoia with medications and refusal of med  -- Metabolic profile and EKG monitoring obtained while on an atypical antipsychotic (BMI:35.61; Lipid Panel:cholesterol 187, HDL 58, LDL 119, triglycerides 49; HbgA1c:6.0 QTc:457)   -- Encouraged patient to  participate in unit milieu and in scheduled group therapies   -- Short Term Goals: Ability to identify changes in lifestyle to reduce recurrence of condition will improve and Ability to demonstrate self-control will improve  -- Long Term Goals: Improvement in symptoms so as ready for discharge  3. Medical Issues Being Addressed:   Questionable clean catch contaminant on UA  -- Repeat clean catch UA and urine culture pending when she will comply with giving specimen - currently asymptomatic   Elevated total bilirubin on admission (1.7) - resolved  -- Hepatic function panel on 03/10/20 shows total bili 0.9 and remainder of panel WNL  Thrombocytosis (platelets  422)  -- Repeat CBC on 03/10/20 shows platelets 411 with remainder of CBC WNL  -- will need f/u with PCP after discharge for trending and f/u as an outpatient  4. Discharge Planning:   -- Social work and case management to assist with discharge planning and identification of hospital follow-up needs prior to discharge  -- Estimated LOS: 5-7 days  -- Discharge Concerns: Need to establish a safety plan; Medication compliance and effectiveness  -- Discharge Goals: Return home with outpatient referrals for mental health follow-up including medication management/psychotherapy  Earney Navy, NP, PMHNP-BC 03/13/2020, 11:34 AM

## 2020-03-13 NOTE — [facility] Group Notes (Signed)
Psychoeducational Group Note  Date: 03-13-2020 Time:  1000-1100  Group Topic/Focus: The focus of this group was to bring the milieu together with the music of one of the patients who can play the guitar  Participation Level: attentive  Participation Quality:  partisipated  Affect:  appropriate  Cognitive:  unknown  Insight:  Able to enjoy the music  Engagement in Group: engaged  Additional Comments: Pt stayed throughout the group and participated by tapping her foot and swaying to the music.  Dione Housekeeper RN

## 2020-03-13 NOTE — Progress Notes (Signed)
   03/13/20 0626  Vital Signs  Temp 98 F (36.7 C)  Temp Source Oral  Pulse Rate 93  Pulse Rate Source Monitor  Resp 20  BP 123/73  BP Location Right Arm  BP Method Automatic  Patient Position (if appropriate) Sitting  Oxygen Therapy  SpO2 99 %   D: Patient denies SI/HI/AVH. Patient rates anxiety 1/10 and denies depression. Patient out in open areas and attended group.  A:  Patient took scheduled medicine.  Support and encouragement provided Routine safety checks conducted every 15 minutes. Patient  Informed to notify staff with any concerns.   R: Safety maintained.

## 2020-03-13 NOTE — [facility] Group Notes (Signed)
[facility] LCSW Group Therapy Note  Date/Time:  03/13/2020  11:00AM-12:00PM  Type of Therapy and Topic:  Group Therapy:  Music and Mood  Participation Level:  Active   Description of Group: In this process group, members listened to a variety of genres of music and identified that different types of music evoke different responses.  Patients were encouraged to identify music that was soothing for them and music that was energizing for them.  Patients discussed how this knowledge can help with wellness and recovery in various ways including managing depression and anxiety as well as encouraging healthy sleep habits.    Therapeutic Goals: 1. Patients will explore the impact of different varieties of music on mood 2. Patients will verbalize the thoughts they have when listening to different types of music 3. Patients will identify music that is soothing to them as well as music that is energizing to them 4. Patients will discuss how to use this knowledge to assist in maintaining wellness and recovery 5. Patients will explore the use of music as a coping skill  Summary of Patient Progress:  At the beginning of group, patient expressed that she felt anxious with some elements of calm.  At the end of group, patient expressed that her anxiety was improved.  She participated throughout group, although she did not talk a great deal but was very attentive..    Therapeutic Modalities: Solution Focused Brief Therapy Activity   Ambrose Mantle, LCSW

## 2020-03-13 NOTE — Progress Notes (Signed)
   03/12/20 2030  COVID-19 Daily Checkoff  Have you had a fever (temp > 37.80C/100F)  in the past 24 hours?  No  If you have had runny nose, nasal congestion, sneezing in the past 24 hours, has it worsened? No  COVID-19 EXPOSURE  Have you traveled outside the state in the past 14 days? No  Have you been in contact with someone with a confirmed diagnosis of COVID-19 or PUI in the past 14 days without wearing appropriate PPE? No  Have you been living in the same home as a person with confirmed diagnosis of COVID-19 or a PUI (household contact)? No  Have you been diagnosed with COVID-19? No

## 2020-03-13 NOTE — B[facility] Group Notes (Signed)
.  Psychoeducational Group Note   Late entry for 03-12-2020 on 03-13-2020  Date: 03-12-2020 Time: 0900-1000    Goal Setting   Purpose of Group: This group helps to provide patients with the steps of setting a goal that is specific, measurable, attainable, realistic and time specific. A discussion on how we keep ourselves stuck with negative self talk.    Participation Level:  Active  Participation Quality:  Appropriate  Affect:  Appropriate  Cognitive:  Appropriate  Insight:  Improving  Engagement in Group:  Engaged  Additional Comments:  ...  Dione Housekeeper

## 2020-03-14 MED ORDER — RISPERIDONE 1 MG PO TBDP
1.0000 mg | ORAL_TABLET | Freq: Once | ORAL | Status: AC
  Administered 2020-03-14: 1 mg via ORAL
  Filled 2020-03-14 (×2): qty 1

## 2020-03-14 MED ORDER — RISPERIDONE 2 MG PO TBDP
2.0000 mg | ORAL_TABLET | Freq: Every day | ORAL | Status: DC
  Administered 2020-03-15: 2 mg via ORAL
  Filled 2020-03-14 (×2): qty 1

## 2020-03-14 MED ORDER — FLUCONAZOLE 100 MG PO TABS
150.0000 mg | ORAL_TABLET | Freq: Once | ORAL | Status: AC
  Administered 2020-03-14: 150 mg via ORAL
  Filled 2020-03-14: qty 1.5
  Filled 2020-03-14: qty 2

## 2020-03-14 NOTE — Progress Notes (Signed)
   03/13/20 2000  Psych Admission Type (Psych Patients Only)  Admission Status Involuntary  Psychosocial Assessment  Patient Complaints Anxiety  Eye Contact Fair  Facial Expression Flat  Affect Blunted  Speech Slow  Interaction Cautious  Motor Activity Slow  Appearance/Hygiene Unremarkable  Behavior Characteristics Cooperative  Mood Anxious  Thought Process  Coherency WDL  Content WDL  Delusions None reported or observed  Perception WDL  Hallucination None reported or observed  Judgment Impaired  Confusion Mild  Danger to Self  Current suicidal ideation? Denies  Danger to Others  Danger to Others None reported or observed  Patient c/o anxiety  and sleep disturbance at HS Prn Vistaril 25 mg PO given at 2035 and effective. Support and encouragement provided as needed. Will continue to monitor.

## 2020-03-14 NOTE — Progress Notes (Signed)
Recreation Therapy Notes  Date: 2.7.22 Time: 1000 Location: [address] Day Room  Group Topic: Goal Setting  Goal Area(s) Addresses:  Patient will identify goals they want to work towards. Patient will participate in discussion of what a goal is. Patient will successfully layout how they will work towards goals.  Behavioral Response: Attentive  Intervention: Group Conversation, Worksheet  Activity: Garment/textile technologist.  Patients were to identify goals they want to accomplish in a week, month, year and 5 years.  Patients then identified any obstacles they would face, what they need to accomplish goals and what they can start doing tomorrow to work towards goals.  Education: Following directions, Education on Goal Setting  Education Outcome: Acknowledges education   Clinical Observations/Feedback: Patient came in for the last few minutes of group.  Pt completed the worksheet and turned it in after group.  In a week, pt wants a healthier lifestyle; in a month- work steps towards smaller goals; in a year- outline purpose and in 5 years- improve in career.  Pt identified obstacles to reaching goals trying to maintain a lifestyle.  Pt expressed needing to practice everyday to achieve goals and start tomorrow by writing out a plan.   Caroll Rancher, LRT/CTRS    Caroll Rancher A 03/14/2020 11:37 AM

## 2020-03-14 NOTE — Progress Notes (Signed)
[facility] MD Progress Note  Andrea Pugh  MRN:  [id number]  03/14/20  Chief Complaint: paranoia and psychosis  Subjective:  Andrea Pugh is a 24y/o female with past psychiatric history of MDD with psychotic features, anxiety, previous cutting behaviors, bipolar d/o with psychotic features, and multiple past psychiatric hospitalizations, who was admitted after being brought in by police for bizarre behaviors. Prior to admission she was reportedly found by police driving around looking for someone she thought had been shot. She then told police she was driving to her Uncle's but provided an address that did not exist. Her records state that while at the [facility] she was reporting VH of seeing demons, had thought blocking and disorganized behaviors, and attempted to elope.The patient is currently on Hospital Day 6.  Chart Review from last 24 hours:  The patient's chart was reviewed and nursing notes were reviewed. The patient's case was discussed in multidisciplinary team meeting. Per nursing the patient has denied AVH but has remained cautious in her interactions with staff and peers. She has c/o anxiety and sleep disturbance. She was able to attend groups yesterday. Per [facility] she has been compliant with scheduled medications. She did require PRN Vistaril X1 last night for anxiety.   Information Obtained Today During Patient Interview: The patient was seen and evaluated on the unit. She is guarded during the interview and is minimally conversant with direct questions. She denies SI, HI, AVH, ideas of reference, or first rank symptoms, but she has evidence of some residual thought blocking on exam. She denies medication side-effects other than feeling "lightheaded 2 days ago" which she states resolved with improved po intake. She states she feels anxious. She cannot state why she is in the hospital and continues to be unable to report her most recent medication trials, her current mental health provider, or  her medication compliance prior to admission. She voices no physical complaints on my exam but reportedly told nursing she had some vaginal itching today.   Principal Problem: Schizophrenia spectrum disorder with psychotic disorder type not yet determined (HCC) Diagnosis: Principal Problem:   Schizophrenia spectrum disorder with psychotic disorder type not yet determined (HCC)  Total Time Spent in Direct Patient Care:  I personally spent 30 minutes on the unit in direct patient care. The direct patient care time included face-to-face time with the patient, reviewing the patient's chart, communicating with other professionals, and coordinating care. Greater than 50% of this time was spent in counseling or coordinating care with the patient regarding goals of hospitalization, psycho-education, and discharge planning needs.  Past Psychiatric History: see admission H&P  Past Medical History: see admission H&P  Family History: see admission H&P  Family Psychiatric  History: see admission H&P  Social History: works at a [employer]; graduated from college and living with a roommate in [place]; denies drug or alcohol use  Sleep: Good per patient report  Appetite: Good per patient report  Current Medications: Current Facility-Administered Medications  Medication Dose Route Frequency Provider Last Rate Last Admin  . acetaminophen (TYLENOL) tablet 650 mg  650 mg Oral Q6H PRN Estella Husk, MD      . alum & mag hydroxide-simeth (MAALOX/MYLANTA) 200-200-20 MG/5ML suspension 30 mL  30 mL Oral Q4H PRN Estella Husk, MD      . benztropine (COGENTIN) tablet 0.5 mg  0.5 mg Oral BID Estella Husk, MD   0.5 mg at 03/14/20 0808  . escitalopram (LEXAPRO) tablet 10 mg  10 mg Oral Daily  Comer Locket, MD   10 mg at 03/14/20 8563  . fluconazole (DIFLUCAN) tablet 150 mg  150 mg Oral Once Bartholomew Crews E, MD      . hydrOXYzine (ATARAX/VISTARIL) tablet 25 mg  25 mg Oral TID PRN  Estella Husk, MD   25 mg at 03/13/20 2035  . OLANZapine zydis (ZYPREXA) disintegrating tablet 5 mg  5 mg Oral Q8H PRN Comer Locket, MD       And  . LORazepam (ATIVAN) tablet 1 mg  1 mg Oral PRN Comer Locket, MD       And  . ziprasidone (GEODON) injection 20 mg  20 mg Intramuscular PRN Mason Jim, Arleth Mccullar E, MD      . magnesium hydroxide (MILK OF MAGNESIA) suspension 30 mL  30 mL Oral Daily PRN Estella Husk, MD      . risperiDONE (RISPERDAL M-TABS) disintegrating tablet 1 mg  1 mg Oral Daily Bartholomew Crews E, MD   1 mg at 03/14/20 0808  . risperiDONE (RISPERDAL M-TABS) disintegrating tablet 2 mg  2 mg Oral QHS Bartholomew Crews E, MD   2 mg at 03/13/20 2035  . traZODone (DESYREL) tablet 50 mg  50 mg Oral QHS PRN Aldean Baker, NP       Lab Results:  No results found for this or any previous visit (from the past 48 hour(s)). Blood Alcohol level:  Lab Results  Component Value Date   ETH <10 03/07/2020   ETH <10 04/20/2019   Metabolic Disorder Labs: Lab Results  Component Value Date   HGBA1C 6.0 (H) 03/07/2020   MPG 125.5 03/07/2020   MPG 114.02 04/02/2019   Lab Results  Component Value Date   PROLACTIN 66.1 (H) 04/13/2019   Lab Results  Component Value Date   CHOL 187 03/07/2020   TRIG 49 03/07/2020   HDL 58 03/07/2020   CHOLHDL 3.2 03/07/2020   VLDL 10 03/07/2020   LDLCALC 119 (H) 03/07/2020   LDLCALC 113 (H) 04/02/2019   Physical Findings: AIMS: Facial and Oral Movements Muscles of Facial Expression: None, normal Lips and Perioral Area: None, normal Jaw: None, normal Tongue: None, normal,Extremity Movements Upper (arms, wrists, hands, fingers): None, normal Lower (legs, knees, ankles, toes): None, normal, Trunk Movements Neck, shoulders, hips: None, normal, Overall Severity Severity of abnormal movements (highest score from questions above): None, normal Incapacitation due to abnormal movements: None, normal Patient's awareness of abnormal movements  (rate only patient's report): No Awareness, Dental Status Current problems with teeth and/or dentures?: No Does patient usually wear dentures?: No      Musculoskeletal: Strength & Muscle Tone: uncooperative for testing Gait & Station: uncooperative for testing Patient leans: N/A  Psychiatric Specialty Exam: Physical Exam Vitals reviewed.  Constitutional:      Appearance: Normal appearance.  HENT:     Head: Normocephalic.  Pulmonary:     Effort: Pulmonary effort is normal.     Review of Systems  Constitutional: Negative for fever.  Respiratory: Negative for cough and shortness of breath.   Cardiovascular: Negative for chest pain.  Gastrointestinal: Negative for nausea and vomiting.  Genitourinary: Negative for difficulty urinating, dysuria and urgency.    Blood pressure 116/66, pulse (!) 115, temperature 97.8 F (36.6 C), temperature source Oral, resp. rate 20, height 5\' 5"  (1.651 m), weight 97.1 kg, SpO2 98 %.Body mass index is 35.61 kg/m.  General Appearance: improved hygiene, minimally engaged with examiner, appears stated age  Eye Contact:  Minimal  Speech:  Periods of halting speech at times; answers direct questions with 2-3 word answers  Volume:  Decreased  Mood: described as "okay" - appears guarded  Affect:  Restricted, guarded, suspicious  Thought Process:  Concrete with periods of thought blocking  Orientation:  Would not cooperate for questioning  Thought Content:  Denied AVH, ideas of reference, or first rank symptoms; has periods of thought blocking to suggest response to internal stimuli at points during exam  Suicidal Thoughts:  Denied  Homicidal Thoughts:  Denied  Memory:  Limited secondary to psychosis  Judgement:  Impaired  Insight:  Lacking  Psychomotor Activity:  Decreased, no tremors  Concentration:  Fair  Recall:  Poor  Fund of Knowledge:  Fair  Language:  Fair  Akathisia:  Negative  Assets:  Desire for Improvement Resilience  ADL's: Intact   Cognition:  Impaired,  Mild  Sleep:  Number of Hours: 6.75   Treatment Plan Summary:  ASSESSMENT: Diagnoses / Active Problems: Unspecified schizophrenia spectrum and other psychotic d/o (r/o MDD with psychotic features, r/o bipolar with psychotic features, r/o schizoaffective d/o)  PLAN: 1. Safety and Monitoring:  -- Involuntary admission to inpatient psychiatric unit for safety, stabilization and treatment  -- Daily contact with patient to assess and evaluate symptoms and progress in treatment  -- Patient's case to be discussed in multi-disciplinary team meeting  -- Observation Level : q15 minute checks  -- Vital signs:  q12 hours  -- Precautions: suicide and elopement  2. Psychiatric Diagnoses and Treatment:  Unspecified schizophrenia spectrum and other psychotic d/o (r/o MDD with psychotic features, r/o bipolar with psychotic features, r/o schizoaffective d/o)  -- Increase Risperdal M tabs 2mg  po bid for residual symptoms -- Continue Cogentin 0.5mg  bid -- Continue Lexapro 10mg  daily in the event this is related to MDD with psychosis and to help with reported anxiety  -- Metabolic profile and EKG monitoring obtained while on an atypical antipsychotic (BMI:35.61; Lipid Panel:cholesterol 187, HDL 58, LDL 119, triglycerides 49; HbgA1c:6.0 QTc:457)   -- Encouraged patient to participate in unit milieu and in scheduled group therapies   -- Short Term Goals: Ability to identify changes in lifestyle to reduce recurrence of condition will improve and Ability to demonstrate self-control will improve  -- Long Term Goals: Improvement in symptoms so as ready for discharge  3. Medical Issues Being Addressed:   Questionable clean catch contaminant on UA  -- Urine culture showed multiple species present with suggestion for recollection so repeat urine culture ordered  -- Diflucan 150mg  po X1 for vaginal itching   Elevated total bilirubin on admission (1.7) - resolved  -- Hepatic function panel  on 03/10/20 shows total bili 0.9 and remainder of panel WNL   Thrombocytosis (platelets 422)  -- Repeat CBC on 03/10/20 shows platelets 411 with remainder of CBC WNL  -- will need f/u with PCP after discharge for trending and f/u as an outpatient  4. Discharge Planning:   -- Social work and case management to assist with discharge planning and identification of hospital follow-up needs prior to discharge  -- Estimated LOS: 5-7 days  -- Discharge Concerns: Need to establish a safety plan; Medication compliance and effectiveness  -- Discharge Goals: Return home with outpatient referrals for mental health follow-up including medication management/psychotherapy  , MD, FAPA 03/14/2020, 1:40 PM

## 2020-03-14 NOTE — Progress Notes (Signed)
Pt visible in the dayroom this evening. Pt stated she was ok, thought blocking appeared to improve. Pt stated she has issues with waking up in the middle of the night so , pt was given PRN Trazodone and Vistaril per [facility] with her HS medication    03/14/20 2200  Psych Admission Type (Psych Patients Only)  Admission Status Involuntary  Psychosocial Assessment  Patient Complaints Depression  Eye Contact Fair  Facial Expression Flat  Affect Blunted  Speech Slow  Interaction Cautious  Motor Activity Slow  Appearance/Hygiene Unremarkable  Behavior Characteristics Cooperative  Mood Anxious;Suspicious  Thought Process  Coherency WDL  Content WDL  Delusions None reported or observed  Perception WDL  Hallucination None reported or observed  Judgment Impaired  Confusion Mild  Danger to Self  Current suicidal ideation? Denies  Danger to Others  Danger to Others None reported or observed

## 2020-03-14 NOTE — Progress Notes (Signed)
Adult Psychoeducational Group Note  Date:  03/14/2020 Time:  10:49 PM  Group Topic/Focus:  Wrap-Up Group:   The focus of this group is to help patients review their daily goal of treatment and discuss progress on daily workbooks.  Participation Level:  Active  Participation Quality:  Appropriate  Affect:  Appropriate  Cognitive:  Appropriate  Insight: Appropriate  Engagement in Group:  Developing/Improving  Modes of Intervention:  Discussion  Additional Comments:  Pt stated her goal for today was to focus on her treatment plan. Pt stated she felt she accomplished her goal today. Pt stated she talk with her doctor and social worker, regarding her care today. Pt stated she took all her medication today from her providers. Pt stated she did not make any calls today.Pt rated her overall day a 10 today. Pt stated she felt better about herself today. Pt stated her appetite was pretty good today and she received all meals today.  Pt stated her sleep last night was good. Pt stated the goal for tonight was to get some rest. Pt stated she was in no physical pain. Pt deny auditory or visual hallucinations. Pt denies thoughts of harming herself or others. Pt stated she would alert staff if anything changes.  Andrea Pugh 03/14/2020, 10:49 PM

## 2020-03-14 NOTE — Tx Team (Signed)
Interdisciplinary Treatment and Diagnostic Plan Update  03/14/2020 Time of Session: 10:35am Andrea Pugh MRN: [id number]  Principal Diagnosis: Schizophrenia spectrum disorder with psychotic disorder type not yet determined [facility])  Secondary Diagnoses: Principal Problem:   Schizophrenia spectrum disorder with psychotic disorder type not yet determined (HCC)   Current Medications:  Current Facility-Administered Medications  Medication Dose Route Frequency Provider Last Rate Last Admin  . acetaminophen (TYLENOL) tablet 650 mg  650 mg Oral Q6H PRN Estella Husk, MD      . alum & mag hydroxide-simeth (MAALOX/MYLANTA) 200-200-20 MG/5ML suspension 30 mL  30 mL Oral Q4H PRN Estella Husk, MD      . benztropine (COGENTIN) tablet 0.5 mg  0.5 mg Oral BID Estella Husk, MD   0.5 mg at 03/14/20 0808  . escitalopram (LEXAPRO) tablet 10 mg  10 mg Oral Daily Comer Locket, MD   10 mg at 03/14/20 4008  . fluconazole (DIFLUCAN) tablet 150 mg  150 mg Oral Once Bartholomew Crews E, MD      . hydrOXYzine (ATARAX/VISTARIL) tablet 25 mg  25 mg Oral TID PRN Estella Husk, MD   25 mg at 03/13/20 2035  . OLANZapine zydis (ZYPREXA) disintegrating tablet 5 mg  5 mg Oral Q8H PRN Comer Locket, MD       And  . LORazepam (ATIVAN) tablet 1 mg  1 mg Oral PRN Comer Locket, MD       And  . ziprasidone (GEODON) injection 20 mg  20 mg Intramuscular PRN Mason Jim, Amy E, MD      . magnesium hydroxide (MILK OF MAGNESIA) suspension 30 mL  30 mL Oral Daily PRN Estella Husk, MD      . risperiDONE (RISPERDAL M-TABS) disintegrating tablet 1 mg  1 mg Oral Once Mason Jim, Amy E, MD      . risperiDONE (RISPERDAL M-TABS) disintegrating tablet 2 mg  2 mg Oral QHS Bartholomew Crews E, MD   2 mg at 03/13/20 2035  . [START ON 03/15/2020] risperiDONE (RISPERDAL M-TABS) disintegrating tablet 2 mg  2 mg Oral Daily Singleton, Amy E, MD      . traZODone (DESYREL) tablet 50 mg  50 mg Oral QHS PRN Aldean Baker, NP       PTA Medications: No medications prior to admission.    Patient Stressors: Medication change or noncompliance Other: [name] would not answer any more  Patient Strengths: Physical Health Supportive family/friends  Treatment Modalities: Medication Management, Group therapy, Case management,  1 to 1 session with clinician, Psychoeducation, Recreational therapy.   Physician Treatment Plan for Primary Diagnosis: Schizophrenia spectrum disorder with psychotic disorder type not yet determined (HCC) Long Term Goal(s): Improvement in symptoms so as ready for discharge   Short Term Goals: Ability to identify changes in lifestyle to reduce recurrence of condition will improve Ability to demonstrate self-control will improve  Medication Management: Evaluate patient's response, side effects, and tolerance of medication regimen.  Therapeutic Interventions: 1 to 1 sessions, Unit Group sessions and Medication administration.  Evaluation of Outcomes: Progressing  Physician Treatment Plan for Secondary Diagnosis: Principal Problem:   Schizophrenia spectrum disorder with psychotic disorder type not yet determined (HCC)  Long Term Goal(s): Improvement in symptoms so as ready for discharge   Short Term Goals: Ability to identify changes in lifestyle to reduce recurrence of condition will improve Ability to demonstrate self-control will improve     Medication Management: Evaluate patient's response, side effects, and tolerance of medication regimen.  Therapeutic Interventions: 1 to 1 sessions, Unit Group sessions and Medication administration.  Evaluation of Outcomes: Progressing   RN Treatment Plan for Primary Diagnosis: Schizophrenia spectrum disorder with psychotic disorder type not yet determined (HCC) Long Term Goal(s): Knowledge of disease and therapeutic regimen to maintain health will improve  Short Term Goals: Ability to demonstrate self-control, Ability to  participate in decision making will improve and Ability to verbalize feelings will improve  Medication Management: RN will administer medications as ordered by provider, will assess and evaluate patient's response and provide education to patient for prescribed medication. RN will report any adverse and/or side effects to prescribing provider.  Therapeutic Interventions: 1 on 1 counseling sessions, Psychoeducation, Medication administration, Evaluate responses to treatment, Monitor vital signs and CBGs as ordered, Perform/monitor CIWA, COWS, AIMS and Fall Risk screenings as ordered, Perform wound care treatments as ordered.  Evaluation of Outcomes: Progressing   LCSW Treatment Plan for Primary Diagnosis: Schizophrenia spectrum disorder with psychotic disorder type not yet determined (HCC) Long Term Goal(s): Safe transition to appropriate next level of care at discharge, Engage patient in therapeutic group addressing interpersonal concerns.  Short Term Goals: Engage patient in aftercare planning with referrals and resources, Increase social support and Increase ability to appropriately verbalize feelings  Therapeutic Interventions: Assess for all discharge needs, 1 to 1 time with Social worker, Explore available resources and support systems, Assess for adequacy in community support network, Educate family and significant other(s) on suicide prevention, Complete Psychosocial Assessment, Interpersonal group therapy.  Evaluation of Outcomes: Progressing   Progress in Treatment: Attending groups: No. Participating in groups: No. Taking medication as prescribed: No. Toleration medication: No. Family/Significant other contact made: Yes, individual(s) contacted:  will obtain consents from pt Patient understands diagnosis: No. Discussing patient identified problems/goals with staff: Yes. Medical problems stabilized or resolved: Yes. Denies suicidal/homicidal ideation: Yes. Issues/concerns per  patient self-inventory: No. Other: None  New problem(s) identified: No, Describe:  CSW will continue to assess  New Short Term/Long Term Goal(s):medication stabilization, elimination of SI thoughts, development of comprehensive mental wellness plan.  Patient Goals:  "to work on my breathing techniques."  Discharge Plan or Barriers: Patient recently admitted. CSW will continue to follow and assess for appropriate referrals and possible discharge planning.  Reason for Continuation of Hospitalization: Delusions  Medication stabilization  Estimated Length of Stay: 3-5 days  Attendees: Patient: Andrea Pugh 03/09/2020   Physician: Dr. Cindi Carbon 03/09/2020   Nursing:  03/09/2020   RN Care Manager: 03/09/2020   Social Worker: Fredirick Lathe, Theresia Majors 03/09/2020   Recreational Therapist:  03/09/2020   Other:  03/09/2020   Other:  03/09/2020   Other: 03/09/2020       Scribe for Treatment Team: Felizardo Hoffmann, Theresia Majors 03/14/2020 1:54 PM

## 2020-03-14 NOTE — [facility] Group Notes (Signed)
[facility] LCSW Group Therapy  03/14/2020 1:59 PM  Type of Therapy:  Group Therapy: Stress Management  Participation Level:  Did Not Attend  Summary of Progress/Problems: Patient was sleeping and did not attend.  Lark Langenfeld A Fradel Baldonado 03/14/2020, 1:59 PM

## 2020-03-15 MED ORDER — LORAZEPAM 1 MG PO TABS: 1.0000 mg | ORAL_TABLET | ORAL | Status: DC | PRN

## 2020-03-15 MED ORDER — RISPERIDONE 2 MG PO TBDP: 2.0000 mg | ORAL_TABLET | Freq: Three times a day (TID) | ORAL | Status: DC | PRN

## 2020-03-15 MED ORDER — ZIPRASIDONE MESYLATE 20 MG IM SOLR: 20.0000 mg | INTRAMUSCULAR | Status: DC | PRN

## 2020-03-15 MED ORDER — LORAZEPAM 1 MG PO TABS: 1.0000 mg | ORAL_TABLET | Freq: Four times a day (QID) | ORAL | Status: DC | PRN

## 2020-03-15 MED ORDER — RISPERIDONE 2 MG PO TBDP
2.0000 mg | ORAL_TABLET | Freq: Every day | ORAL | Status: DC
  Administered 2020-03-16: 2 mg via ORAL
  Filled 2020-03-15 (×2): qty 1

## 2020-03-15 MED ORDER — ZIPRASIDONE MESYLATE 20 MG IM SOLR: 20.0000 mg | Freq: Four times a day (QID) | INTRAMUSCULAR | Status: DC | PRN

## 2020-03-15 MED ORDER — RISPERIDONE 3 MG PO TBDP
3.0000 mg | ORAL_TABLET | Freq: Every day | ORAL | Status: DC
  Administered 2020-03-15: 3 mg via ORAL
  Filled 2020-03-15 (×2): qty 1

## 2020-03-15 NOTE — Plan of Care (Signed)
  Problem: Self-Concept: Goal: Will verbalize positive feelings about self Outcome: Progressing Goal: Level of anxiety will decrease Outcome: Progressing   Problem: Education: Goal: Knowledge of [place] [facility] Education information/materials will improve Outcome: Progressing Goal: Emotional status will improve Outcome: Progressing

## 2020-03-15 NOTE — Progress Notes (Signed)
Pt up requesting something to help her sleep, pt given PRN Vistaril per [facility] to help her relax and hopefully fall back to sleep

## 2020-03-15 NOTE — Progress Notes (Addendum)
Pt given PRN Trazodone with HS medication. Pt stated she wanted to know if she could get something for nightmares , pt informed she was on Minipress but it was stopped due to oversedation . Pt informed to discuss this with the doctor in the morning.    03/15/20 2200  Psych Admission Type (Psych Patients Only)  Admission Status Involuntary  Psychosocial Assessment  Patient Complaints Anxiety  Eye Contact Fair  Facial Expression Anxious;Pensive  Affect Anxious;Preoccupied  Speech Logical/coherent  Interaction Assertive  Motor Activity Slow  Appearance/Hygiene Unremarkable  Behavior Characteristics Cooperative  Mood Anxious  Thought Process  Coherency WDL  Content WDL  Delusions None reported or observed  Perception WDL  Hallucination None reported or observed  Judgment WDL  Confusion None  Danger to Self  Current suicidal ideation? Denies  Danger to Others  Danger to Others None reported or observed

## 2020-03-15 NOTE — Progress Notes (Signed)
Recreation Therapy Notes  Date: 2.8.22 Time: 1000 Location: 500 Hall Dayroom  Group Topic: Wellness  Goal Area(s) Addresses:  Patient will define components of whole wellness. Patient will verbalize benefit of whole wellness.  Intervention:  Music  Activity: Exercise and Meditation.  LRT led group in a series of stretches to loosen up the muscles.  Patients then took turns leading the group in exercises of their choosing.  Patients could take breaks and get water as needed.  LRT closed out group playing a meditation for the group that focused on making the most of your day.  Education: Wellness, Building control surveyor.   Education Outcome: Acknowledges education/In group clarification offered/Needs additional education.   Clinical Observations/Feedback: Pt did not attend group session.    Caroll Rancher, LRT/CTRS         Caroll Rancher A 03/15/2020 11:19 AM

## 2020-03-15 NOTE — Progress Notes (Signed)
   03/15/20 0500  Sleep  Number of Hours 7.25

## 2020-03-15 NOTE — Progress Notes (Signed)
Progress note    03/15/20 0756  Psych Admission Type (Psych Patients Only)  Admission Status Involuntary  Psychosocial Assessment  Patient Complaints Anxiety  Eye Contact Fair  Facial Expression Anxious;Pensive  Affect Anxious;Preoccupied  Speech Logical/coherent  Interaction Assertive  Motor Activity Slow  Appearance/Hygiene Unremarkable  Behavior Characteristics Cooperative;Appropriate to situation;Anxious  Mood Anxious;Pleasant  Thought Process  Coherency WDL  Content WDL  Delusions None reported or observed  Perception WDL  Hallucination None reported or observed  Judgment WDL  Confusion None  Danger to Self  Current suicidal ideation? Denies  Danger to Others  Danger to Others None reported or observed

## 2020-03-15 NOTE — Progress Notes (Signed)
[facility] MD Progress Note  03/15/2020 11:54 AM Andrea Pugh  MRN:  [id number] Subjective: Patient is a 26 year old female with a past psychiatric history significant for major depression with psychotic features, anxiety, previous cutting behaviors, suspected bipolar disorder with psychotic features who was admitted on 03/09/2020 after having been brought by police for bizarre behaviors.  According to the comprehensive clinical assessment note she had been found by police driving around looking for an individual she believed had been shot.  She was noted on admission to have thought blocking and disorganized behaviors.  She also attempted to elope from the emergency department on admission.   Objective: Patient is seen and examined.  Patient is a 25 year old female with the above-stated past psychiatric history who is seen in follow-up. During the course of the hospitalization she apparently had been restarted on Lexapro, and Risperdal was titrated to 2 mg p.o. twice daily.  She also was continued on Cogentin.  She had been on prazosin also, but that had been stopped secondary to oversedation.  Today on examination she is guarded, and does not provide a great history.  We discussed previous psychiatric hospitalizations and her need for medications.  She denied any auditory or visual hallucinations.  She still clearly is somewhat paranoid.  Her blood pressure stable at 124/75.  She is tachycardic with a rate of 125.  She is afebrile.  Pulse oximetry on room air was 98%.  She did sleep 7.25 hours last night.  Her urinalysis on admission was contaminated with 11-20 squamous epithelial cells.  She has received a one-time dose of Diflucan for possible infection.  Drug screen was negative.  Blood alcohol was less than 10.  Urine culture is pending.  EKG showed a normal sinus rhythm with a normal QTc interval.   Principal Problem: Schizophrenia spectrum disorder with psychotic disorder type not yet determined  (HCC) Diagnosis: Principal Problem:   Schizophrenia spectrum disorder with psychotic disorder type not yet determined (HCC)  Total Time spent with patient: 20 minutes  Past Psychiatric History: See admission H&P  Past Medical History:  Past Medical History:  Diagnosis Date  . Anxiety   . Depression    History reviewed. No pertinent surgical history. Family History: History reviewed. No pertinent family history. Family Psychiatric  History: See admission H&P Social History:  Social History   Substance and Sexual Activity  Alcohol Use Yes   Comment: occasional     Social History   Substance and Sexual Activity  Drug Use Yes  . Types: Marijuana    Social History   Socioeconomic History  . Marital status: Single    Spouse name: Not on file  . Number of children: Not on file  . Years of education: Not on file  . Highest education level: Not on file  Occupational History  . Occupation: Consulting civil engineer  Tobacco Use  . Smoking status: Never Smoker  . Smokeless tobacco: Never Used  Vaping Use  . Vaping Use: Never used  Substance and Sexual Activity  . Alcohol use: Yes    Comment: occasional  . Drug use: Yes    Types: Marijuana  . Sexual activity: Not Currently  Other Topics Concern  . Not on file  Social History Narrative   Pt currently lives in student housing at [employer].  She is currently contemplating continuing her education.  She also works at a [employer].  Pt currently followed by [facility].   Social Determinants of Health   Financial Resource Strain: Not on  file  Food Insecurity: Not on file  Transportation Needs: Not on file  Physical Activity: Not on file  Stress: Not on file  Social Connections: Not on file   Additional Social History:                         Sleep: Fair  Appetite:  Fair  Current Medications: Current Facility-Administered Medications  Medication Dose Route Frequency Provider Last Rate Last Admin  . acetaminophen  (TYLENOL) tablet 650 mg  650 mg Oral Q6H PRN Estella Husk, MD      . alum & mag hydroxide-simeth (MAALOX/MYLANTA) 200-200-20 MG/5ML suspension 30 mL  30 mL Oral Q4H PRN Estella Husk, MD      . benztropine (COGENTIN) tablet 0.5 mg  0.5 mg Oral BID Estella Husk, MD   0.5 mg at 03/15/20 0756  . escitalopram (LEXAPRO) tablet 10 mg  10 mg Oral Daily Mason Jim, Amy E, MD   10 mg at 03/15/20 0756  . hydrOXYzine (ATARAX/VISTARIL) tablet 25 mg  25 mg Oral TID PRN Estella Husk, MD   25 mg at 03/14/20 2115  . LORazepam (ATIVAN) tablet 1 mg  1 mg Oral PRN Comer Locket, MD       And  . ziprasidone (GEODON) injection 20 mg  20 mg Intramuscular PRN Mason Jim, Amy E, MD      . LORazepam (ATIVAN) tablet 1 mg  1 mg Oral Q6H PRN Antonieta Pert, MD       And  . risperiDONE (RISPERDAL M-TABS) disintegrating tablet 2 mg  2 mg Oral Q8H PRN Antonieta Pert, MD       And  . ziprasidone (GEODON) injection 20 mg  20 mg Intramuscular Q6H PRN Antonieta Pert, MD      . magnesium hydroxide (MILK OF MAGNESIA) suspension 30 mL  30 mL Oral Daily PRN Estella Husk, MD      . Melene Muller ON 03/16/2020] risperiDONE (RISPERDAL M-TABS) disintegrating tablet 2 mg  2 mg Oral Daily Antonieta Pert, MD      . risperiDONE (RISPERDAL M-TABS) disintegrating tablet 3 mg  3 mg Oral QHS Antonieta Pert, MD      . traZODone (DESYREL) tablet 50 mg  50 mg Oral QHS PRN Aldean Baker, NP   50 mg at 03/14/20 2116    Lab Results: No results found for this or any previous visit (from the past 48 hour(s)).  Blood Alcohol level:  Lab Results  Component Value Date   ETH <10 03/07/2020   ETH <10 04/20/2019    Metabolic Disorder Labs: Lab Results  Component Value Date   HGBA1C 6.0 (H) 03/07/2020   MPG 125.5 03/07/2020   MPG 114.02 04/02/2019   Lab Results  Component Value Date   PROLACTIN 66.1 (H) 04/13/2019   Lab Results  Component Value Date   CHOL 187 03/07/2020   TRIG 49  03/07/2020   HDL 58 03/07/2020   CHOLHDL 3.2 03/07/2020   VLDL 10 03/07/2020   LDLCALC 119 (H) 03/07/2020   LDLCALC 113 (H) 04/02/2019    Physical Findings: AIMS: Facial and Oral Movements Muscles of Facial Expression: None, normal Lips and Perioral Area: None, normal Jaw: None, normal Tongue: None, normal,Extremity Movements Upper (arms, wrists, hands, fingers): None, normal Lower (legs, knees, ankles, toes): None, normal, Trunk Movements Neck, shoulders, hips: None, normal, Overall Severity Severity of abnormal movements (highest score from questions above): None, normal Incapacitation  due to abnormal movements: None, normal Patient's awareness of abnormal movements (rate only patient's report): No Awareness, Dental Status Current problems with teeth and/or dentures?: No Does patient usually wear dentures?: No  CIWA:    COWS:     Musculoskeletal: Strength & Muscle Tone: within normal limits Gait & Station: normal Patient leans: N/A  Psychiatric Specialty Exam: Physical Exam Vitals and nursing note reviewed.  Constitutional:      Appearance: She is obese.  HENT:     Head: Normocephalic and atraumatic.  Pulmonary:     Effort: Pulmonary effort is normal.  Neurological:     General: No focal deficit present.     Mental Status: She is alert and oriented to person, place, and time.     Review of Systems  Blood pressure 124/75, pulse (!) 125, temperature 97.8 F (36.6 C), temperature source Oral, resp. rate 20, height 5\' 5"  (1.651 m), weight 97.1 kg, SpO2 98 %.Body mass index is 35.61 kg/m.  General Appearance: Casual  Eye Contact:  Fair  Speech:  Normal Rate  Volume:  Decreased  Mood:  Anxious and Dysphoric  Affect:  Flat  Thought Process:  Coherent and Descriptions of Associations: Loose  Orientation:  Full (Time, Place, and Person)  Thought Content:  Delusions and Paranoid Ideation  Suicidal Thoughts:  No  Homicidal Thoughts:  No  Memory:  Immediate;    Fair Recent;   Fair Remote;   Fair  Judgement:  Impaired  Insight:  Fair  Psychomotor Activity:  Decreased  Concentration:  Concentration: Fair and Attention Span: Fair  Recall:  of Knowledge:  Fair  Language:  Good  Akathisia:  Negative  Handed:  Right  AIMS (if indicated):     Assets:  Desire for Improvement Resilience  ADL's:  Intact  Cognition:  WNL  Sleep:  Number of Hours: 7.25     Treatment Plan Summary: Daily contact with patient to assess and evaluate symptoms and progress in treatment, Medication management and Plan : Patient is seen and examined.  Patient is a 25 year old female with the above-stated past psychiatric history who is seen in follow-up.   Diagnosis: 1.  Probable schizoaffective disorder; depressive type versus unspecified schizophrenia spectrum disorder. 2.  Reported history of bipolar disorder  Pertinent findings on examination today: 1.  Patient remains paranoid. 2.  Patient denied auditory hallucinations, but I suspect that she is still having some residual symptoms. 3.  Still appears guarded.  Plan: 1.  Continue Cogentin 0.5 mg p.o. twice daily for side effects of medication. 2.  Continue Lexapro 10 mg p.o. daily for depression and anxiety. 3.  Continue lorazepam 1 mg p.o. as needed agitation. 4.  Continue Geodon 20 mg IM every 6 hours as needed agitation. 5.  Increase Risperdal to 2 mg p.o. daily and 3 mg p.o. nightly for psychosis, mood stability and sleep. 6.  Continue Risperdal agitation protocol as needed. 7.  Continue trazodone 50 mg p.o. nightly as needed insomnia. 8.  Repeat urinalysis. 9.  Disposition planning-in progress.  30, MD 03/15/2020, 11:54 AM

## 2020-03-16 LAB — CBC WITH DIFFERENTIAL/PLATELET
Abs Immature Granulocytes: 0.02 10*3/uL (ref 0.00–0.07)
Basophils Absolute: 0 10*3/uL (ref 0.0–0.1)
Basophils Relative: 0 %
Eosinophils Absolute: 0 10*3/uL (ref 0.0–0.5)
Eosinophils Relative: 1 %
HCT: 37.4 % (ref 36.0–46.0)
Hemoglobin: 12.6 g/dL (ref 12.0–15.0)
Immature Granulocytes: 0 %
Lymphocytes Relative: 24 %
Lymphs Abs: 1.4 10*3/uL (ref 0.7–4.0)
MCH: 31.3 pg (ref 26.0–34.0)
MCHC: 33.7 g/dL (ref 30.0–36.0)
MCV: 93 fL (ref 80.0–100.0)
Monocytes Absolute: 0.6 10*3/uL (ref 0.1–1.0)
Monocytes Relative: 11 %
Neutro Abs: 3.7 10*3/uL (ref 1.7–7.7)
Neutrophils Relative %: 64 %
Platelets: 358 10*3/uL (ref 150–400)
RBC: 4.02 MIL/uL (ref 3.87–5.11)
RDW: 12.9 % (ref 11.5–15.5)
WBC: 5.9 10*3/uL (ref 4.0–10.5)
nRBC: 0 % (ref 0.0–0.2)

## 2020-03-16 LAB — COMPREHENSIVE METABOLIC PANEL
ALT: 20 U/L (ref 0–44)
AST: 15 U/L (ref 15–41)
Albumin: 4.2 g/dL (ref 3.5–5.0)
Alkaline Phosphatase: 66 U/L (ref 38–126)
Anion gap: 12 (ref 5–15)
BUN: 12 mg/dL (ref 6–20)
CO2: 24 mmol/L (ref 22–32)
Calcium: 9.3 mg/dL (ref 8.9–10.3)
Chloride: 103 mmol/L (ref 98–111)
Creatinine, Ser: 0.7 mg/dL (ref 0.44–1.00)
GFR, Estimated: >60 mL/min (ref >60)
Glucose, Bld: 112 mg/dL — ABNORMAL HIGH (ref 70–99)
Potassium: 4.1 mmol/L (ref 3.5–5.1)
Sodium: 139 mmol/L (ref 135–145)
Total Bilirubin: 0.5 mg/dL (ref 0.3–1.2)
Total Protein: 7.3 g/dL (ref 6.5–8.1)

## 2020-03-16 LAB — URINE CULTURE: Culture: <10000 — AB

## 2020-03-16 LAB — GLUCOSE, CAPILLARY: Glucose-Capillary: 112 mg/dL — ABNORMAL HIGH (ref 70–99)

## 2020-03-16 MED ORDER — ESCITALOPRAM OXALATE 20 MG PO TABS
20.0000 mg | ORAL_TABLET | Freq: Every day | ORAL | Status: DC
  Administered 2020-03-17 – 2020-03-18 (×2): 20 mg via ORAL
  Filled 2020-03-16 (×3): qty 1
  Filled 2020-03-16: qty 7

## 2020-03-16 MED ORDER — MECLIZINE HCL 12.5 MG PO TABS
12.5000 mg | ORAL_TABLET | Freq: Two times a day (BID) | ORAL | Status: DC | PRN
  Filled 2020-03-16 (×3): qty 1

## 2020-03-16 MED ORDER — ONDANSETRON 4 MG PO TBDP
4.0000 mg | ORAL_TABLET | Freq: Once | ORAL | Status: AC
  Administered 2020-03-16: 4 mg via ORAL
  Filled 2020-03-16 (×2): qty 1

## 2020-03-16 MED ORDER — RISPERIDONE 3 MG PO TBDP
3.0000 mg | ORAL_TABLET | Freq: Every day | ORAL | Status: DC
  Administered 2020-03-16 – 2020-03-17 (×2): 3 mg via ORAL
  Filled 2020-03-16 (×3): qty 1

## 2020-03-16 MED ORDER — RISPERIDONE 1 MG PO TBDP
1.0000 mg | ORAL_TABLET | Freq: Every day | ORAL | Status: DC
  Administered 2020-03-17 – 2020-03-18 (×2): 1 mg via ORAL
  Filled 2020-03-16 (×3): qty 1

## 2020-03-16 MED ORDER — RISPERIDONE 2 MG PO TBDP
4.0000 mg | ORAL_TABLET | Freq: Every day | ORAL | Status: DC
  Filled 2020-03-16: qty 2

## 2020-03-16 MED ORDER — CLONIDINE HCL 0.1 MG PO TABS
0.1000 mg | ORAL_TABLET | Freq: Every day | ORAL | Status: DC
  Filled 2020-03-16: qty 1

## 2020-03-16 MED ORDER — TRAZODONE HCL 50 MG PO TABS
25.0000 mg | ORAL_TABLET | Freq: Every evening | ORAL | Status: DC | PRN
  Administered 2020-03-16 – 2020-03-17 (×2): 25 mg via ORAL
  Filled 2020-03-16 (×2): qty 1

## 2020-03-16 MED ORDER — ENSURE ENLIVE PO LIQD
237.0000 mL | Freq: Two times a day (BID) | ORAL | Status: DC
  Administered 2020-03-16: 237 mL via ORAL
  Filled 2020-03-16 (×6): qty 237

## 2020-03-16 MED ORDER — ESCITALOPRAM OXALATE 10 MG PO TABS
10.0000 mg | ORAL_TABLET | ORAL | Status: AC
  Administered 2020-03-16: 10 mg via ORAL
  Filled 2020-03-16 (×2): qty 1

## 2020-03-16 NOTE — Progress Notes (Signed)
   03/16/20 0500  Sleep  Number of Hours 6.5

## 2020-03-16 NOTE — Progress Notes (Signed)
[facility] MD Progress Note  03/16/2020 10:35 AM Andrea Pugh  MRN:  [id number] Subjective:  Patient is a 25 year old female with a past psychiatric history significant for major depression with psychotic features, anxiety, previous cutting behaviors, suspected bipolar disorder with psychotic features who was admitted on 03/09/2020 after having been brought by police for bizarre behaviors.  According to the comprehensive clinical assessment note she had been found by police driving around looking for an individual she believed had been shot.  She was noted on admission to have thought blocking and disorganized behaviors.  She also attempted to elope from the emergency department on admission.   Objective: Patient is seen and examined.  Patient is a 25 year old female with the above-stated past psychiatric history who is seen in follow-up.  This morning she sitting in her room in the dark.  She is reading a Bible.  She stated that one of her "friends" told her to read the Bible.  The friend was another patient.  Her vital signs are stable, she is afebrile.  Her pulse oximetry on room air was 100%.  She slept a little bit better last night is 6.5 hours.  She stated she did not sleep well.  She stated some of that may have been because of agitation from other patients on the ward.  Review of the nursing notes showed that she was anxious throughout the evening.  She requested something for her nightmares and flashbacks last night, but her Minipress had been held secondary to what was interpreted as being over sedating.  Her affect is certainly depressed this morning.  She denied auditory or visual hallucinations.  She denied suicidal or homicidal ideation.  Review of the electronic medical record revealed that she had been on 4 mg p.o. nightly on admission.  It was decreased to 1 mg p.o. nightly, and discontinued after that.  No new laboratories.  Urine culture from 2 days ago showed less than 10,000 colonies of  insignificant growth.  Principal Problem: Schizophrenia spectrum disorder with psychotic disorder type not yet determined (HCC) Diagnosis: Principal Problem:   Schizophrenia spectrum disorder with psychotic disorder type not yet determined (HCC)  Total Time spent with patient: 20 minutes  Past Psychiatric History: See admission H&P  Past Medical History:  Past Medical History:  Diagnosis Date  . Anxiety   . Depression    History reviewed. No pertinent surgical history. Family History: History reviewed. No pertinent family history. Family Psychiatric  History: See admission H&P Social History:  Social History   Substance and Sexual Activity  Alcohol Use Yes   Comment: occasional     Social History   Substance and Sexual Activity  Drug Use Yes  . Types: Marijuana    Social History   Socioeconomic History  . Marital status: Single    Spouse name: Not on file  . Number of children: Not on file  . Years of education: Not on file  . Highest education level: Not on file  Occupational History  . Occupation: Consulting civil engineer  Tobacco Use  . Smoking status: Never Smoker  . Smokeless tobacco: Never Used  Vaping Use  . Vaping Use: Never used  Substance and Sexual Activity  . Alcohol use: Yes    Comment: occasional  . Drug use: Yes    Types: Marijuana  . Sexual activity: Not Currently  Other Topics Concern  . Not on file  Social History Narrative   Pt currently lives in student housing at [employer].  She is currently  contemplating continuing her education.  She also works at a [employer].  Pt currently followed by [facility].   Social Determinants of Health   Financial Resource Strain: Not on file  Food Insecurity: Not on file  Transportation Needs: Not on file  Physical Activity: Not on file  Stress: Not on file  Social Connections: Not on file   Additional Social History:                         Sleep: Fair  Appetite:  Fair  Current  Medications: Current Facility-Administered Medications  Medication Dose Route Frequency Provider Last Rate Last Admin  . acetaminophen (TYLENOL) tablet 650 mg  650 mg Oral Q6H PRN Estella Husk, MD      . alum & mag hydroxide-simeth (MAALOX/MYLANTA) 200-200-20 MG/5ML suspension 30 mL  30 mL Oral Q4H PRN Estella Husk, MD      . benztropine (COGENTIN) tablet 0.5 mg  0.5 mg Oral BID Estella Husk, MD   0.5 mg at 03/15/20 2027  . escitalopram (LEXAPRO) tablet 10 mg  10 mg Oral Daily Mason Jim, Amy E, MD   10 mg at 03/15/20 0756  . hydrOXYzine (ATARAX/VISTARIL) tablet 25 mg  25 mg Oral TID PRN Estella Husk, MD   25 mg at 03/15/20 2259  . LORazepam (ATIVAN) tablet 1 mg  1 mg Oral Q6H PRN Antonieta Pert, MD       And  . risperiDONE (RISPERDAL M-TABS) disintegrating tablet 2 mg  2 mg Oral Q8H PRN Antonieta Pert, MD       And  . ziprasidone (GEODON) injection 20 mg  20 mg Intramuscular Q6H PRN Antonieta Pert, MD      . magnesium hydroxide (MILK OF MAGNESIA) suspension 30 mL  30 mL Oral Daily PRN Estella Husk, MD      . risperiDONE (RISPERDAL M-TABS) disintegrating tablet 2 mg  2 mg Oral Daily Antonieta Pert, MD      . risperiDONE (RISPERDAL M-TABS) disintegrating tablet 3 mg  3 mg Oral QHS Antonieta Pert, MD   3 mg at 03/15/20 2027  . traZODone (DESYREL) tablet 50 mg  50 mg Oral QHS PRN Aldean Baker, NP   50 mg at 03/15/20 2027    Lab Results: No results found for this or any previous visit (from the past 48 hour(s)).  Blood Alcohol level:  Lab Results  Component Value Date   ETH <10 03/07/2020   ETH <10 04/20/2019    Metabolic Disorder Labs: Lab Results  Component Value Date   HGBA1C 6.0 (H) 03/07/2020   MPG 125.5 03/07/2020   MPG 114.02 04/02/2019   Lab Results  Component Value Date   PROLACTIN 66.1 (H) 04/13/2019   Lab Results  Component Value Date   CHOL 187 03/07/2020   TRIG 49 03/07/2020   HDL 58 03/07/2020   CHOLHDL  3.2 03/07/2020   VLDL 10 03/07/2020   LDLCALC 119 (H) 03/07/2020   LDLCALC 113 (H) 04/02/2019    Physical Findings: AIMS: Facial and Oral Movements Muscles of Facial Expression: None, normal Lips and Perioral Area: None, normal Jaw: None, normal Tongue: None, normal,Extremity Movements Upper (arms, wrists, hands, fingers): None, normal Lower (legs, knees, ankles, toes): None, normal, Trunk Movements Neck, shoulders, hips: None, normal, Overall Severity Severity of abnormal movements (highest score from questions above): None, normal Incapacitation due to abnormal movements: None, normal Patient's awareness of abnormal movements (  rate only patient's report): No Awareness, Dental Status Current problems with teeth and/or dentures?: No Does patient usually wear dentures?: No  CIWA:    COWS:     Musculoskeletal: Strength & Muscle Tone: within normal limits Gait & Station: normal Patient leans: N/A  Psychiatric Specialty Exam: Physical Exam Vitals and nursing note reviewed.  HENT:     Head: Normocephalic and atraumatic.  Pulmonary:     Effort: Pulmonary effort is normal.  Neurological:     General: No focal deficit present.     Mental Status: She is alert and oriented to person, place, and time.     Review of Systems  Blood pressure 120/69, pulse (!) 124, temperature 97.7 F (36.5 C), temperature source Oral, resp. rate 20, height 5\' 5"  (1.651 m), weight 97.1 kg, SpO2 99 %.Body mass index is 35.61 kg/m.  General Appearance: Fairly Groomed  Eye Contact:  Fair  Speech:  Normal Rate  Volume:  Decreased  Mood:  Depressed and Dysphoric  Affect:  Congruent  Thought Process:  Coherent and Descriptions of Associations: Intact  Orientation:  Full (Time, Place, and Person)  Thought Content:  Delusions and Paranoid Ideation  Suicidal Thoughts:  No  Homicidal Thoughts:  No  Memory:  Immediate;   Fair Recent;   Fair Remote;   Fair  Judgement:  Intact  Insight:  Fair   Psychomotor Activity:  Psychomotor Retardation  Concentration:  Concentration: Fair and Attention Span: Fair  Recall:  of Knowledge:  Fair  Language:  Good  Akathisia:  Negative  Handed:  Right  AIMS (if indicated):     Assets:  Desire for Improvement Resilience  ADL's:  Intact  Cognition:  WNL  Sleep:  Number of Hours: 6.5     Treatment Plan Summary: Daily contact with patient to assess and evaluate symptoms and progress in treatment, Medication management and Plan : Patient is seen and examined.  Patient is a 25 year old female with the above-stated past psychiatric history who is seen in follow-up.   Diagnosis: 1.  Probable schizoaffective disorder; depressive type versus unspecified schizophrenia spectrum disorder. 2.  Reported history of bipolar disorder  Pertinent findings on examination today: 1.  Patient still remains paranoid. 2.  Patient appears to be significantly depressed. 3.  Patient denied auditory hallucinations, but she does not deny this with great vigor. 4.  She still appears to be significantly guarded.  Plan: 1.  Continue Cogentin 0.5 mg p.o. twice daily for side effects of medications. 2.  Increase Lexapro to 20 mg p.o. daily for anxiety and depression. 3.  Continue Risperdal 2 mg p.o. daily but increase nighttime dose to 4 mg p.o. nightly.  This is for mood stability and psychosis. 4.  Add clonidine 0.1 mg p.o. nightly to substitute for Minipress for nightmares and flashbacks. 5.  Continue trazodone 50 mg p.o. nightly as needed insomnia. 6.  Disposition planning-in progress.  30, MD 03/16/2020, 10:35 AM

## 2020-03-16 NOTE — [facility] Group Notes (Signed)
[facility] LCSW Group Therapy  03/16/2020 2:10 PM  Participation Level:  Minimal  Type of Therapy and Topic: Group Therapy: Gratitude  Description of Group: The purpose behind this group is to get people thinking about things for which  they can be grateful. If continued over time, they might begin to spontaneously look for things and  situations for which to be grateful. Gratitude is related to a "wide variety of forms of wellbeing , whereas "negative attributions" can adversely affect relationships.  Several studies have shown that interventions to  increase gratitude can impact areas such as overall life satisfaction, decreased negative affect, increased happiness, the ability to provide emotional support to others, and decreased worrying.   Therapeutic Goals: 1. Patient will learn activities that focus on gratitude in their daily lives. 2. Patient will share gratitude in their daily lives. 3. Patient will learn to develop healthy habits and positive thinking techniques. 4. Patient will receive support and feedback from others  Therapeutic Modalities: Cognitive Behavioral Therapy Solution Focused Therapy Motivational Interviewing  Summary of Progress/Problems: Patient initially came to group, however left towards the beginning and did not return.    Noralyn Karim A Sharea Guinther 03/16/2020, 2:10 PM

## 2020-03-16 NOTE — Progress Notes (Signed)
Progress note    03/16/20 1000  Psych Admission Type (Psych Patients Only)  Admission Status Involuntary  Psychosocial Assessment  Patient Complaints Anxiety  Eye Contact Fair  Facial Expression Anxious;Pensive  Affect Anxious;Preoccupied  Speech Logical/coherent  Interaction Cautious;Forwards little;Guarded;Minimal  Motor Activity Slow  Appearance/Hygiene Unremarkable  Behavior Characteristics Cooperative;Appropriate to situation;Anxious;Guarded  Mood Anxious;Preoccupied;Pleasant  Thought Process  Coherency WDL  Content WDL  Delusions None reported or observed  Perception WDL  Hallucination None reported or observed  Judgment Poor  Confusion None  Danger to Self  Current suicidal ideation? Denies  Danger to Others  Danger to Others None reported or observed

## 2020-03-16 NOTE — Progress Notes (Signed)
Pt stated she was doing ok, pt a little sad on the unit this evening because she wants a roommate.     03/16/20 2200  Psych Admission Type (Psych Patients Only)  Admission Status Involuntary  Psychosocial Assessment  Patient Complaints Anxiety;Worrying  Eye Contact Fair  Facial Expression Anxious;Pensive  Affect Anxious;Preoccupied  Speech Logical/coherent  Interaction Cautious;Forwards little;Guarded;Minimal  Motor Activity Slow  Appearance/Hygiene Unremarkable  Behavior Characteristics Cooperative  Mood Anxious;Preoccupied  Thought Process  Coherency WDL  Content WDL  Delusions None reported or observed  Perception WDL  Hallucination None reported or observed  Judgment Poor  Confusion None  Danger to Self  Current suicidal ideation? Denies  Danger to Others  Danger to Others None reported or observed

## 2020-03-16 NOTE — Progress Notes (Signed)
Recreation Therapy Notes  Date: 2.9.22 Time: 1000 Location: [address] Dayroom  Group Topic: Anger Management  Goal Area(s) Addresses:  Patient will identify triggers for anger.  Patient will identify reactions to anger. Patient will identify coping skills for dealing with anger.   Intervention: Conversation with group, and worksheets  Activity: Introduction to Anger Management.  Patients were to identify triggers to anger, actions they exhibit when angry, any problems they have encountered because of anger and coping skills they can use to deal with anger.  Education: Anger Management, Discharge Planning   Education Outcome: Acknowledges education/In group clarification offered/Needs additional education.   Clinical Observations/Feedback: Pt did not attend group session.     Caroll Rancher, LRT/CTRS    Caroll Rancher A 03/16/2020 11:28 AM

## 2020-03-16 NOTE — Progress Notes (Signed)
Pt did not attend orientation group.  

## 2020-03-16 NOTE — Plan of Care (Signed)
  Problem: Safety: Goal: Ability to disclose and discuss suicidal ideas will improve Outcome: Progressing   Problem: Self-Concept: Goal: Will verbalize positive feelings about self Outcome: Progressing Goal: Level of anxiety will decrease Outcome: Progressing   Problem: Education: Goal: Emotional status will improve Outcome: Progressing

## 2020-03-17 NOTE — Progress Notes (Addendum)
Pt presents guarded with constricted affect, irritable mood, minimal with logical soft speech. However, pt does brightens up on interactions. Reports fair sleep last night with fair appetite. Rates her depression, anxiety and hopelessness all 0/10. Denies SI, HI, AVH and pain when assessed. Goal for today is to "Focus on feeling better". Pt attended OT group and was engaged. Remains paranoid, verbalized fear being alone and will like to have a room mate. Compliant with medications when offered. Denies adverse drug reactions when assessed.  Safety maintained at Q 15 minutes intervals without self harm gestures. All medications given as ordered. Support, encouragement and reassurance provided to pt throughout this shift.  Pt tolerates meals well. Denies discomfort or concerns at this time.

## 2020-03-17 NOTE — Progress Notes (Signed)
   03/17/20 2200  Psych Admission Type (Psych Patients Only)  Admission Status Involuntary  Psychosocial Assessment  Patient Complaints Anxiety;Worrying  Eye Contact Fair  Facial Expression Anxious;Pensive  Affect Anxious;Preoccupied  Speech Logical/coherent  Interaction Cautious;Forwards little;Guarded;Minimal  Motor Activity Slow  Appearance/Hygiene Unremarkable  Behavior Characteristics Cooperative  Mood Anxious  Thought Process  Coherency WDL  Content WDL  Delusions None reported or observed  Perception WDL  Hallucination None reported or observed  Judgment Poor  Confusion None  Danger to Self  Current suicidal ideation? Denies  Danger to Others  Danger to Others None reported or observed

## 2020-03-17 NOTE — Progress Notes (Signed)
   03/17/20 0500  Sleep  Number of Hours 8.75

## 2020-03-17 NOTE — [facility] Group Notes (Signed)
Occupational Therapy Group Note Date: 03/16/2020 Group Topic/Focus: Coping Skills  Group Description: Group encouraged increased social engagement and participation through discussion/activity focused on "Building Happiness." Patients engaged in a structured discussion and worked through six strategies to building happiness and improving mood including: identifying gratitudes, acts of kindness, exercise, meditation, positive journaling, and fostering relationships. Patients were encouraged to identify one of the six strategies to begin using as a way to boost their mood and add to their therapeutic toolkit.   Therapeutic Goal(s): Demonstrate age-appropriate social skills and socialization within a structured group setting Demonstrate orientation to topic and identify relevant responses to group discussion.  Participation Level: Active   Participation Quality: Independent   Behavior: Calm and Cooperative   Speech/Thought Process: Coherent and Flat   Affect/Mood: Constricted   Insight: Limited   Judgement: Limited   Individualization: Deloyce was active in her participation of discussion and identified feeling "nervous". Pt identified being grateful for "family" and one way she exercises is through "workouts on an app I have on my phone". Pt identified "all of them, gratitude, journaling, exercise, relationships, acts of kindness and meditation" as strategies she would like to engage in more of to boost her happiness.   Modes of Intervention: Discussion, Education and Socialization  Patient Response to Interventions:  Attentive and Engaged   Plan: Continue to engage patient in OT groups 2 - 3x/week.  03/17/2020  Donne Hazel, MOT, OTR/L

## 2020-03-17 NOTE — Progress Notes (Signed)
[facility] MD Progress Note  03/17/2020 11:59 AM Andrea Pugh  MRN:  [id number] Subjective:  Patient is a 25 year old female with a past psychiatric history significant for major depression with psychotic features, anxiety, previous cutting behaviors, suspected bipolar disorder with psychotic features who was admitted on 03/09/2020 after having been brought by police for bizarre behaviors. According to the comprehensive clinical assessment note she had been found by police driving around looking for an individual she believed had been shot. She was noted on admission to have thought blocking and disorganized behaviors. She also attempted to elope from the emergency department on admission.   Objective: Patient is seen and examined.  Patient is a 25 year old female with the above-stated past psychiatric history who is seen in follow-up.  The plan yesterday was to increase her Risperdal given her continued paranoia.  Later in the morning and early afternoon she became "dizzy".  Her vital signs are stable, her blood sugar was normal.  EKG showed no new changes.  Lab work-up at that time was essentially unchanged from admission was all essentially normal.  Today on examination she denies any problems with dizziness.  She stated that she is sleeping better during the day now.  She did get good sleep last night.  Her current dosage of Risperdal is 1 mg p.o. daily and 3 mg p.o. nightly.  Also yesterday because of continued anxiety and depression I increased her Lexapro to 20 mg p.o. daily.  She denied any auditory or visual hallucinations.  She denied any suicidal or homicidal ideation.  Review of her laboratories from 2/9 showed normal electrolytes including creatinine at 0.70.  Liver function enzymes were normal.  Her CBC was normal with a white blood cell count of 5.9.  Platelets were 358,000.  Differential was normal.  Her hemoglobin A1c on 1/31 was 6.0, so she does have diabetes but well controlled by diet.  Repeat  urinalysis results are not in the chart currently.  The patient did state that she gave a sample.  She is still mildly tachycardic at a rate of 128-1 34.  She is currently on force fluids as well as Ensure.  She is afebrile.  Blood pressure stable.  Pulse oximetry is 99% on room air.  She did sleep 8.75 hours last night.  Principal Problem: Schizophrenia spectrum disorder with psychotic disorder type not yet determined (HCC) Diagnosis: Principal Problem:   Schizophrenia spectrum disorder with psychotic disorder type not yet determined (HCC)  Total Time spent with patient: 20 minutes  Past Psychiatric History: See admission H&P  Past Medical History:  Past Medical History:  Diagnosis Date  . Anxiety   . Depression    History reviewed. No pertinent surgical history. Family History: History reviewed. No pertinent family history. Family Psychiatric  History: See admission H&P Social History:  Social History   Substance and Sexual Activity  Alcohol Use Yes   Comment: occasional     Social History   Substance and Sexual Activity  Drug Use Yes  . Types: Marijuana    Social History   Socioeconomic History  . Marital status: Single    Spouse name: Not on file  . Number of children: Not on file  . Years of education: Not on file  . Highest education level: Not on file  Occupational History  . Occupation: Consulting civil engineer  Tobacco Use  . Smoking status: Never Smoker  . Smokeless tobacco: Never Used  Vaping Use  . Vaping Use: Never used  Substance and Sexual Activity  .  Alcohol use: Yes    Comment: occasional  . Drug use: Yes    Types: Marijuana  . Sexual activity: Not Currently  Other Topics Concern  . Not on file  Social History Narrative   Pt currently lives in student housing at [employer].  She is currently contemplating continuing her education.  She also works at a [employer].  Pt currently followed by [facility].   Social Determinants of Health   Financial Resource  Strain: Not on file  Food Insecurity: Not on file  Transportation Needs: Not on file  Physical Activity: Not on file  Stress: Not on file  Social Connections: Not on file   Additional Social History:                         Sleep: Good  Appetite:  Fair  Current Medications: Current Facility-Administered Medications  Medication Dose Route Frequency Provider Last Rate Last Admin  . acetaminophen (TYLENOL) tablet 650 mg  650 mg Oral Q6H PRN Estella Husk, MD      . alum & mag hydroxide-simeth (MAALOX/MYLANTA) 200-200-20 MG/5ML suspension 30 mL  30 mL Oral Q4H PRN Estella Husk, MD      . escitalopram (LEXAPRO) tablet 20 mg  20 mg Oral Daily Antonieta Pert, MD   20 mg at 03/17/20 0816  . feeding supplement (ENSURE ENLIVE / ENSURE PLUS) liquid 237 mL  237 mL Oral BID BM Antonieta Pert, MD   237 mL at 03/16/20 1451  . hydrOXYzine (ATARAX/VISTARIL) tablet 25 mg  25 mg Oral TID PRN Estella Husk, MD   25 mg at 03/16/20 2039  . LORazepam (ATIVAN) tablet 1 mg  1 mg Oral Q6H PRN Antonieta Pert, MD       And  . risperiDONE (RISPERDAL M-TABS) disintegrating tablet 2 mg  2 mg Oral Q8H PRN Antonieta Pert, MD       And  . ziprasidone (GEODON) injection 20 mg  20 mg Intramuscular Q6H PRN Antonieta Pert, MD      . magnesium hydroxide (MILK OF MAGNESIA) suspension 30 mL  30 mL Oral Daily PRN Estella Husk, MD      . meclizine (ANTIVERT) tablet 12.5 mg  12.5 mg Oral BID PRN Antonieta Pert, MD      . risperiDONE (RISPERDAL M-TABS) disintegrating tablet 1 mg  1 mg Oral Daily Antonieta Pert, MD   1 mg at 03/17/20 0817  . risperidone (RISPERDAL M-TABS) disintegrating tablet 3 mg  3 mg Oral QHS Antonieta Pert, MD   3 mg at 03/16/20 2038  . traZODone (DESYREL) tablet 25 mg  25 mg Oral QHS PRN Antonieta Pert, MD   25 mg at 03/16/20 2038    Lab Results:  Results for orders placed or performed during the hospital encounter of 03/08/20  (from the past 48 hour(s))  Glucose, capillary     Status: Abnormal   Collection Time: 03/16/20  2:32 PM  Result Value Ref Range   Glucose-Capillary 112 (H) 70 - 99 mg/dL    Comment: Glucose reference range applies only to samples taken after fasting for at least 8 hours.  CBC with Differential/Platelet     Status: None   Collection Time: 03/16/20  5:52 PM  Result Value Ref Range   WBC 5.9 4.0 - 10.5 K/uL   RBC 4.02 3.87 - 5.11 MIL/uL   Hemoglobin 12.6 12.0 - 15.0 g/dL  HCT 37.4 36.0 - 46.0 %   MCV 93.0 80.0 - 100.0 fL   MCH 31.3 26.0 - 34.0 pg   MCHC 33.7 30.0 - 36.0 g/dL   RDW 69.6 29.5 - 28.4 %   Platelets 358 150 - 400 K/uL   nRBC 0.0 0.0 - 0.2 %   Neutrophils Relative % 64 %   Neutro Abs 3.7 1.7 - 7.7 K/uL   Lymphocytes Relative 24 %   Lymphs Abs 1.4 0.7 - 4.0 K/uL   Monocytes Relative 11 %   Monocytes Absolute 0.6 0.1 - 1.0 K/uL   Eosinophils Relative 1 %   Eosinophils Absolute 0.0 0.0 - 0.5 K/uL   Basophils Relative 0 %   Basophils Absolute 0.0 0.0 - 0.1 K/uL   Immature Granulocytes 0 %   Abs Immature Granulocytes 0.02 0.00 - 0.07 K/uL    Comment: Performed at [facility], [address] [place], [place] [address]  Comprehensive metabolic panel     Status: Abnormal   Collection Time: 03/16/20  5:52 PM  Result Value Ref Range   Sodium 139 135 - 145 mmol/L   Potassium 4.1 3.5 - 5.1 mmol/L   Chloride 103 98 - 111 mmol/L   CO2 24 22 - 32 mmol/L   Glucose, Bld 112 (H) 70 - 99 mg/dL    Comment: Glucose reference range applies only to samples taken after fasting for at least 8 hours.   BUN 12 6 - 20 mg/dL   Creatinine, Ser 0.10 0.44 - 1.00 mg/dL   Calcium 9.3 8.9 - 27.2 mg/dL   Total Protein 7.3 6.5 - 8.1 g/dL   Albumin 4.2 3.5 - 5.0 g/dL   AST 15 15 - 41 U/L   ALT 20 0 - 44 U/L   Alkaline Phosphatase 66 38 - 126 U/L   Total Bilirubin 0.5 0.3 - 1.2 mg/dL   GFR, Estimated >53 >66 mL/min    Comment: (NOTE) Calculated using the CKD-EPI Creatinine  Equation (2021)    Anion gap 12 5 - 15    Comment: Performed at [facility], [address] [place], [place] [address]    Blood Alcohol level:  Lab Results  Component Value Date   [facility] <10 03/07/2020   ETH <10 04/20/2019    Metabolic Disorder Labs: Lab Results  Component Value Date   HGBA1C 6.0 (H) 03/07/2020   MPG 125.5 03/07/2020   MPG 114.02 04/02/2019   Lab Results  Component Value Date   PROLACTIN 66.1 (H) 04/13/2019   Lab Results  Component Value Date   CHOL 187 03/07/2020   TRIG 49 03/07/2020   HDL 58 03/07/2020   CHOLHDL 3.2 03/07/2020   VLDL 10 03/07/2020   LDLCALC 119 (H) 03/07/2020   LDLCALC 113 (H) 04/02/2019    Physical Findings: AIMS: Facial and Oral Movements Muscles of Facial Expression: None, normal Lips and Perioral Area: None, normal Jaw: None, normal Tongue: None, normal,Extremity Movements Upper (arms, wrists, hands, fingers): None, normal Lower (legs, knees, ankles, toes): None, normal, Trunk Movements Neck, shoulders, hips: None, normal, Overall Severity Severity of abnormal movements (highest score from questions above): None, normal Incapacitation due to abnormal movements: None, normal Patient's awareness of abnormal movements (rate only patient's report): No Awareness, Dental Status Current problems with teeth and/or dentures?: No Does patient usually wear dentures?: No  CIWA:    COWS:     Musculoskeletal: Strength & Muscle Tone: within normal limits Gait & Station: normal Patient leans: N/A  Psychiatric Specialty Exam: Physical Exam Vitals and nursing note reviewed.  Constitutional:      Appearance: She is obese.  HENT:     Head: Normocephalic and atraumatic.  Pulmonary:     Effort: Pulmonary effort is normal.  Neurological:     General: No focal deficit present.     Mental Status: She is alert and oriented to person, place, and time.     Review of Systems  Blood pressure 125/81, pulse (!) 134,  temperature 98.9 F (37.2 C), temperature source Oral, resp. rate 20, height 5\' 5"  (1.651 m), weight 97.1 kg, SpO2 98 %.Body mass index is 35.61 kg/m.  General Appearance: Fairly Groomed  Eye Contact:  Fair  Speech:  Normal Rate  Volume:  Decreased  Mood:  Dysphoric  Affect:  Congruent  Thought Process:  Coherent and Descriptions of Associations: Circumstantial  Orientation:  Full (Time, Place, and Person)  Thought Content:  Delusions and Paranoid Ideation  Suicidal Thoughts:  No  Homicidal Thoughts:  No  Memory:  Immediate;   Poor Recent;   Poor Remote;   Poor  Judgement:  Intact  Insight:  Lacking  Psychomotor Activity:  Normal  Concentration:  Concentration: Fair and Attention Span: Fair  Recall:  of Knowledge:  Fair  Language:  Good  Akathisia:  Negative  Handed:  Right  AIMS (if indicated):     Assets:  Desire for Improvement Resilience  ADL's:  Intact  Cognition:  WNL  Sleep:  Number of Hours: 8.75     Treatment Plan Summary: Daily contact with patient to assess and evaluate symptoms and progress in treatment, Medication management and Plan : Patient is seen and examined.  Patient is a 25 year old female with the above-stated past psychiatric history who is seen in follow-up.    Diagnosis: 1. Probable schizoaffective disorder; depressive type versus unspecified schizophrenia spectrum disorder. 2. Reported history of bipolar disorder  Pertinent findings on examination today: 1.  Patient remains paranoid but less so than on previous examinations. 2.  Still appears significantly depressed. 3.  Denied auditory hallucinations, but she is denied this from the beginning. 4.  Resolution of dizziness. 5.  Lab work-up for dizziness is negative so far.  Plan: 1.  Continue Lexapro 20 mg p.o. daily for depression and anxiety. 2.  Continue Ensure twice daily with food for poor appetite and dizziness. 3.  Force fluids. 4.  Meclizine 12.5 mg p.o. twice daily  as needed dizziness. 5.  Continue Risperdal 1 mg p.o. daily and 3 mg p.o. nightly for psychosis. 6.  Continue blood sugar testing every morning. 7.  Review of the order showed that the urinalysis was not collected.  We will get that done today. 8.  Patient stated she is living in an apartment with a roommate, and this will have to be confirmed prior to discharge. 9.  EKG from 2/9 showed sinus tachycardia with a QTc interval of approximately 450. 10.  Change diet to diabetic. 11.  Disposition planning-in progress.   4/9, MD 03/17/2020, 11:59 AM

## 2020-03-17 NOTE — Progress Notes (Signed)
Recreation Therapy Notes  Date: 2.10.22 Time: 0950 Location: [address] Dayroom   Group Topic: Communication, Team Building, Problem Solving  Goal Area(s) Addresses:  Patient will effectively work with peer towards shared goal.  Patient will identify skills used to make activity successful.  Patient will share challenges and verbalize solution-driven approaches used. Patient will identify how skills used during activity can be used to reach post d/c goals.   Intervention: STEM Activity   Activity: [employer]. Patients were provided the following materials: 5 drinking straws, 5 rubber bands, 5 paper clips, 2 index cards and 2 drinking cups. Using the provided materials patients were asked to build a launching mechanism to launch a ping pong ball across the room, approximately 10 feet. Patients were divided into teams of 3-5. Instructions required all materials be incorporated into the device, functionality of items left to the peer group's discretion.  Education: Pharmacist, community, Scientist, physiological, Air cabin crew, Building control surveyor.   Education Outcome: Acknowledges education/In group clarification offered/Needs additional education.   Clinical Observations/Feedback: Pt did not attend group session.    Caroll Rancher, LRT/CTRS         Caroll Rancher A 03/17/2020 11:40 AM

## 2020-03-18 DIAGNOSIS — F29 Unspecified psychosis not due to a substance or known physiological condition: Secondary | ICD-10-CM | POA: Diagnosis not present

## 2020-03-18 DIAGNOSIS — F202 Catatonic schizophrenia: Secondary | ICD-10-CM | POA: Diagnosis not present

## 2020-03-18 MED ORDER — RISPERIDONE 4 MG PO TABS: 4.0000 mg | ORAL_TABLET | Freq: Every day | ORAL | 0 refills | Status: DC

## 2020-03-18 MED ORDER — ESCITALOPRAM OXALATE 20 MG PO TABS: 20.0000 mg | ORAL_TABLET | Freq: Every day | ORAL | 0 refills | Status: DC

## 2020-03-18 MED ORDER — RISPERIDONE 1 MG PO TABS
1.0000 mg | ORAL_TABLET | Freq: Every day | ORAL | Status: DC
  Administered 2020-03-18: 1 mg via ORAL
  Filled 2020-03-18: qty 1
  Filled 2020-03-18: qty 7
  Filled 2020-03-18 (×2): qty 1

## 2020-03-18 MED ORDER — RISPERIDONE 2 MG PO TABS
4.0000 mg | ORAL_TABLET | Freq: Every day | ORAL | Status: DC
  Filled 2020-03-18: qty 14
  Filled 2020-03-18: qty 2

## 2020-03-18 MED ORDER — RISPERIDONE 1 MG PO TABS: 1.0000 mg | ORAL_TABLET | Freq: Every day | ORAL | 0 refills | Status: DC

## 2020-03-18 NOTE — Plan of Care (Signed)
Pt was able to identify one relaxation technique that can manage anxiety at completion of recreation therapy group sessions.    Caroll Rancher, LRT/CTRS

## 2020-03-18 NOTE — [facility] Group Notes (Signed)
[facility] LCSW Group Therapy 03/18/2020 1:15 PM  Type of Therapy: Group Therapy: Boundaries   Participation Level: Did Not Attend   Summary of Progress/Problems: Patient declined to attend group.

## 2020-03-18 NOTE — [facility] Counselor (Signed)
CSW met with this patient who stated she was unsure if she was able to return to her apartment and stated she is supposed to be moving out. Patient began tearing up at this time. Patient appeared hesitant in returning to apartment. Patient sated she would like to wait here until her uncle picks her up. When asked if she has been in contact with this uncle she declined and stated she did not have his number. CSW asked this patient how her uncle would know to pick her up, which she was unable to answer.   CSW found patients roommates number in her chart, Kathalene Frames [phone]). Per roommate this patient is able to return to stay with her as long as she is stable and "Acting right." Roommate states she works from 10am-5pm on Saturday and would be able to pick this patient up before or after work, or would be able to pick this patient up on Sunday morning.   Patients roommate stated that this patients phone, keys, and car are all missing from the apartment and she is unsure whether or no patient knows where these belongings are. Per patient she was picked up by [employer] after having a panic attack and is unsure where her car was left or taken.     Darletta Moll MSW, LCSW Clincal Social Worker  [facility]

## 2020-03-18 NOTE — Progress Notes (Signed)
Recreation Therapy Notes  INPATIENT RECREATION TR PLAN  Patient Details Name: Andrea Pugh MRN: [id number] DOB: 05/31/1995 Today's Date: 03/18/2020  Rec Therapy Plan Is patient appropriate for Therapeutic Recreation?: Yes Treatment times per week: about 3 days Estimated Length of Stay: 5-7 days TR Treatment/Interventions: Group participation (Comment)  Discharge Criteria Pt will be discharged from therapy if:: Discharged Treatment plan/goals/alternatives discussed and agreed upon by:: Patient/family  Discharge Summary Short term goals set: See patient care plan Short term goals met: Adequate for discharge Progress toward goals comments: Groups attended Which groups?: Goal setting,Stress management Reason goals not met: None Therapeutic equipment acquired: N/A Reason patient discharged from therapy: Discharge from hospital Pt/family agrees with progress & goals achieved: No Date patient discharged from therapy: 03/18/20   Victorino Sparrow, LRT/CTRS  Ria Comment, Ivry Pigue A 03/18/2020, 12:18 PM

## 2020-03-18 NOTE — Progress Notes (Signed)
D: Pt A & O X 3. Denies SI, HI, AVH and pain at this time. D/C home as ordered. Picked up in lobby by "my room mate". A: D/C instructions reviewed with pt including prescriptions and medication samples. Pt was encouraged to set up outside appointment. All belongings from assigned locker given to pt at time of departure. Scheduled medications given with verbal education and effects monitored. Safety checks maintained without incident till time of d/c.  R: Pt receptive to care. Compliant with medications when offered. Denies adverse drug reactions when assessed. Verbalized understanding related to d/c instructions. Signed belonging sheet in agreement with items received from locker. Ambulatory with a steady gait. Appears to be in no physical distress at time of departure.

## 2020-03-18 NOTE — Discharge Summary (Signed)
Physician Discharge Summary Note  Patient:  Andrea Pugh is an 25 y.o., female  MRN:  [id number]  DOB:  [birth year]  Patient phone:  [phone] (home)   Patient address:   [address] [place] [address],   Total Time spent with patient: 15 minutes  Date of Admission:  03/08/2020  Date of Discharge: 03/18/20  Reason for Admission: Worsening bizarre behaviors.  Principal Problem: Schizophrenia [facility])  Discharge Diagnoses: Principal Problem:   Schizophrenia [facility])  Past Psychiatric History: Anxiety disorder,                                           Major depressive disorder.                                            Three prior [facility] hospitalizations.                                             Self-injurious behaviors (cutting). Past Medical History:  Past Medical History:  Diagnosis Date  . Anxiety   . Depression    History reviewed. No pertinent surgical history.  Family History: History reviewed. No pertinent family history.  Family Psychiatric  History: See H&P  Social History:  Social History   Substance and Sexual Activity  Alcohol Use Yes   Comment: occasional     Social History   Substance and Sexual Activity  Drug Use Yes  . Types: Marijuana    Social History   Socioeconomic History  . Marital status: Single    Spouse name: Not on file  . Number of children: Not on file  . Years of education: Not on file  . Highest education level: Not on file  Occupational History  . Occupation: Consulting civil engineer  Tobacco Use  . Smoking status: Never Smoker  . Smokeless tobacco: Never Used  Vaping Use  . Vaping Use: Never used  Substance and Sexual Activity  . Alcohol use: Yes    Comment: occasional  . Drug use: Yes    Types: Marijuana  . Sexual activity: Not Currently  Other Topics Concern  . Not on file  Social History Narrative   Pt currently lives in student housing at [employer].  She is currently contemplating continuing her education.  She also  works at a [employer].  Pt currently followed by [facility].   Social Determinants of Health   Financial Resource Strain: Not on file  Food Insecurity: Not on file  Transportation Needs: Not on file  Physical Activity: Not on file  Stress: Not on file  Social Connections: Not on file   Hospital Course: (Per Md's admission evaluation):  Patient is a 24y/o female with past psychiatric history of MDD with psychotic features, anxiety, previous cutting behaviors, bipolar d/o with psychotic features, and multiple past psychiatric hospitalizations, who was admitted after being brought in by police for bizarre behaviors. The patient is a poor historian and will only answer limited questions. She does not know why police brought her to the ED or how police were called. According to her [facility] note, she was found by police driving around looking for an  individual she believed had been shot. She then told police she was driving to her Uncle's house but provided an address that does not exist. When questioned as to why she is in the hospital, she states she was admitted for "anxiety" and "for freaking out about something personal." She thinks she has a diagnosis of bipolar d/o and states she is followed with online psychiatric appointments with a provider at [facility] in [place], [place]. She cannot recall her provider's name and does not know her current or past medication trials. She is vague as to whether or not she has been taking any recent psychotropic medications. She denies AVH, ideas of reference, first rank symptoms or paranoia, but she has obvious thought blocking and paranoia on exam. Her records state that she was reporting VH of seeing demons, had thought blocking and disorganized behaviors, and attempted to elope from the ED prior to admission. The patient denies SI or HI. She denies drug or alcohol use. She reports that she has a roommate and lives in a student apartment after graduating from a local  college in the area. She states she is working at "a [employer]."  This is one of several psychiatric discharge summaries from this [facility] for this 25 year old AA female with hx of chronic mental illness & multiple psychiatric admissions. She is known in this [facility] for worsening symptoms of her mental illness. Both her UDS & toxicology results were negative. She has been tried on multiple psychotropic medications for her symptoms & it appears nothing has actually been helpful in stabilizing her symptoms or it could be that she may not have been compliant with her treatment regimen. She was brought to the [facility] this time around for evaluation & treatment of her worsening symptoms (bizarre behaviors).   After evaluation of her presenting symptoms as noted on the admission notes above, Andrea Pugh was recommended for mood stabilization treatments. The medication regimen for her presenting symptoms were discussed & with her consent initiated. She received, stabilized & was discharged on the medications as listed below on her discharge medication lists. She was also enrolled & participated some in the group counseling sessions being offered & held on this unit. She learned coping skills. She presented on this admission, no other chronic medical conditions that required treatment & monitoring. 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 Patient has been informed of this new condition & was instructed of the need to see her primary care provider after this discharge for treatment. But Andrea Pugh stated that she does not have a primary care provider. She has been referred & recommended to go to the [facility] clinic, a free clinic operated by the [facility] health system on the [address] for her medical care needs. Patient is in agreement. She tolerated her treatment regimen without any adverse effects or reactions  reported.  Demika's symptoms responded well to her treatment regimen.This is evidenced by her reports of improved mood & symptoms. And during the course of her hospitalization, the 15-minute checks were adequate to ensure her safety.  Patient did not display any dangerous, violent or suicidal behavior on the unit.  She interacted with patients & staff appropriately. She participated appropriately in the group sessions/therapies. Her medications were addressed & adjusted to meet her needs. She was recommended for outpatient follow-up care & medication management upon discharge to assure her continuity of care.  At the time of  discharge patient is not reporting any acute suicidal/homicidal ideations. She feels more confident about her self-care & in managing mental health issues. She currently denies any new issues or concerns. Education and supportive counseling provided throughout her hospital stay & upon discharge.  Today upon her discharge evaluation with the attending psychiatrist, Jessicamarie shares she is doing well. She denies any other specific concerns. She is sleeping well. Her appetite is good. She denies other physical complaints. She denies AH/VH, delusional thoughts or paranoia. She feels that her medications have been helpful & is in agreement to continue her current treatment regimen as recommended. She was able to engage in safety planning including plan to return to [facility] or contact emergency services if she feels unable to maintain her own safety or the safety of others. Pt had no further questions, comments, or concerns. She left [facility] with all personal belongings in no apparent distress. Transportation per [employer] per roommate.  Physical Findings: AIMS: Facial and Oral Movements Muscles of Facial Expression: None, normal Lips and Perioral Area: None, normal Jaw: None, normal Tongue: None, normal,Extremity Movements Upper (arms, wrists, hands, fingers): None, normal Lower  (legs, knees, ankles, toes): None, normal, Trunk Movements Neck, shoulders, hips: None, normal, Overall Severity Severity of abnormal movements (highest score from questions above): None, normal Incapacitation due to abnormal movements: None, normal Patient's awareness of abnormal movements (rate only patient's report): No Awareness, Dental Status Current problems with teeth and/or dentures?: No Does patient usually wear dentures?: No  CIWA:    COWS:     Musculoskeletal: Strength & Muscle Tone: within normal limits Gait & Station: normal Patient leans: N/A  Psychiatric Specialty Exam: Physical Exam Vitals and nursing note reviewed.  Constitutional:      Appearance: She is well-developed.  HENT:     Head: Normocephalic.     Nose: Nose normal.     Mouth/Throat:     Pharynx: Oropharynx is clear.  Eyes:     Pupils: Pupils are equal, round, and reactive to light.  Cardiovascular:     Rate and Rhythm: Normal rate.  Pulmonary:     Effort: Pulmonary effort is normal.  Genitourinary:    Comments: Deferred Musculoskeletal:        General: Normal range of motion.     Cervical back: Normal range of motion.  Skin:    General: Skin is warm and dry.  Neurological:     General: No focal deficit present.     Mental Status: She is alert and oriented to person, place, and time.     Review of Systems  Constitutional: Negative.  Negative for chills, diaphoresis and fever.  HENT: Negative for congestion, rhinorrhea, sneezing and sore throat.   Eyes: Negative for discharge.  Respiratory: Negative for cough, shortness of breath and wheezing.   Cardiovascular: Negative for chest pain and palpitations.  Gastrointestinal: Negative for diarrhea, nausea and vomiting.  Endocrine: Negative for cold intolerance.  Genitourinary: Negative for difficulty urinating.  Musculoskeletal: Negative for arthralgias and myalgias.  Skin: Negative.   Allergic/Immunologic: Negative for environmental allergies  and food allergies.       NKDA  Neurological: Negative for dizziness, tremors, seizures, syncope, facial asymmetry, speech difficulty, weakness, light-headedness, numbness and headaches.  Psychiatric/Behavioral: Positive for dysphoric mood (Stabilized with medication prior to discharge), hallucinations (Hx. Psychosis (Stabilized with medication prior to discharge)) and sleep disturbance (Stabilized with medication prior to discharge). Negative for agitation, behavioral problems, confusion, decreased concentration, self-injury and suicidal ideas. The patient is not  nervous/anxious (Stable upon discharge) and is not hyperactive.     Blood prssure 119/75. Heart rate 95. Respirations 20. Temperature 98.2. O2 sat 100%.  See MD's discharge SRA   Has this patient used any form of tobacco in the last 30 days? (Cigarettes, Smokeless Tobacco, Cigars, and/or Pipes): No  Blood Alcohol level:  Lab Results  Component Value Date   ETH <10 03/07/2020   ETH <10 04/20/2019   Metabolic Disorder Labs:  Lab Results  Component Value Date   HGBA1C 6.0 (H) 03/07/2020   MPG 125.5 03/07/2020   MPG 114.02 04/02/2019   Lab Results  Component Value Date   PROLACTIN 66.1 (H) 04/13/2019   Lab Results  Component Value Date   CHOL 187 03/07/2020   TRIG 49 03/07/2020   HDL 58 03/07/2020   CHOLHDL 3.2 03/07/2020   VLDL 10 03/07/2020   LDLCALC 119 (H) 03/07/2020   LDLCALC 113 (H) 04/02/2019   See Psychiatric Specialty Exam and Suicide Risk Assessment completed by Attending Physician prior to discharge.  Discharge destination:  Home  Is patient on multiple antipsychotic therapies at discharge:  No   Has Patient had three or more failed trials of antipsychotic monotherapy by history:  No  Recommended Plan for Multiple Antipsychotic Therapies: NA Discharge Instructions    Diet - low sodium heart healthy   Complete by: As directed    Increase activity slowly   Complete by: As directed      Allergies as  of 03/18/2020   No Known Allergies     Medication List    TAKE these medications     Indication  escitalopram 20 MG tablet Commonly known as: LEXAPRO Take 1 tablet (20 mg total) by mouth daily. Start taking on: March 19, 2020  Indication: Major Depressive Disorder   risperiDONE 1 MG tablet Commonly known as: RISPERDAL Take 1 tablet (1 mg total) by mouth daily.  Indication: Schizophrenia   risperidone 4 MG tablet Commonly known as: RISPERDAL Take 1 tablet (4 mg total) by mouth at bedtime.  Indication: Schizophrenia       Follow-up Information    [facility] Follow up.   Contact information: [address] [place] [address] [phone]       [facility] Follow up.   Specialty: Behavioral Health Contact information: [address] [place] [address] [phone]             Follow-up recommendations: Activity:  As tolerated Diet: As recommended by your primary care doctor. Keep all scheduled follow-up appointments as recommended.  Comments: Prescriptions given at discharge.  Patient agreeable to plan.  Given opportunity to ask questions.  Appears to feel comfortable with discharge denies any current suicidal or homicidal thought. Patient is also instructed prior to discharge to: Take all medications as prescribed by his/her mental healthcare provider. Report any adverse effects and or reactions from the medicines to his/her outpatient provider promptly. Patient has been instructed & cautioned: To not engage in alcohol and or illegal drug use while on prescription medicines. In the event of worsening symptoms, patient is instructed to call the crisis hotline, 911 and or go to the nearest ED for appropriate evaluation and treatment of symptoms. To follow-up with his/her primary care provider for your other medical issues, concerns and or health care  needs.  Signed: Armandina Stammer, NP, PMHNP, FNP-BC 03/18/2020, 11:32 AM

## 2020-03-18 NOTE — BHH Suicide Risk Assessment (Signed)
[facility] Discharge Suicide Risk Assessment   Principal Problem: Schizophrenia spectrum disorder with psychotic disorder type not yet determined [facility]) Discharge Diagnoses: Principal Problem:   Schizophrenia spectrum disorder with psychotic disorder type not yet determined (HCC)   Total Time spent with patient: 30 minutes  Musculoskeletal: Strength & Muscle Tone: within normal limits Gait & Station: normal Patient leans: N/A  Psychiatric Specialty Exam: Review of Systems  All other systems reviewed and are negative.   Blood pressure 125/65, pulse (!) 126, temperature 98.9 F (37.2 C), temperature source Oral, resp. rate 20, height 5\' 5"  (1.651 m), weight 97.1 kg, SpO2 98 %.Body mass index is 35.61 kg/m.  General Appearance: Casual  Eye Contact::  Fair  Speech:  Normal Rate409  Volume:  Normal  Mood:  Anxious  Affect:  Congruent  Thought Process:  Coherent and Descriptions of Associations: Loose  Orientation:  Full (Time, Place, and Person)  Thought Content:  Delusions and Paranoid Ideation  Suicidal Thoughts:  No  Homicidal Thoughts:  No  Memory:  Immediate;   Fair Recent;   Fair Remote;   Fair  Judgement:  Intact  Insight:  Fair  Psychomotor Activity:  Normal  Concentration:  Fair  Recall:  [phone] of Knowledge:Fair  Language: Good  Akathisia:  Negative  Handed:  Right  AIMS (if indicated):     Assets:  Desire for Improvement Resilience  Sleep:  Number of Hours: 4.5  Cognition: WNL  ADL's:  Intact   Mental Status Per Nursing Assessment::   On Admission:  NA  Demographic Factors:  Caucasian, Low socioeconomic status and Unemployed  Loss Factors: Financial problems/change in socioeconomic status  Historical Factors: Impulsivity  Risk Reduction Factors:   Living with another person, especially a relative  Continued Clinical Symptoms:  Depression:   Impulsivity Insomnia Schizophrenia:   Less than 51 years old Paranoid or undifferentiated  type  Cognitive Features That Contribute To Risk:  None    Suicide Risk:  Minimal: No identifiable suicidal ideation.  Patients presenting with no risk factors but with morbid ruminations; may be classified as minimal risk based on the severity of the depressive symptoms   Follow-up Information    [facility] Follow up.   Contact information: [address] [place] [phone]       [facility] Follow up.   Specialty: Behavioral Health Contact information: [address] [place] [phone]              Plan Of Care/Follow-up recommendations:  Activity:  ad lib  [phone], MD 03/18/2020, 11:03 AM

## 2020-03-18 NOTE — Progress Notes (Signed)
Recreation Therapy Notes  Date: 2.11.22 Time: 1005 Location: [address] Dayroom   Group Topic: Self-esteem  Goal Area(s) Addresses:  Patient will identify positive traits about themselves.  Patient will acknowledge the benefit of healthy self-esteem. Patient will endorse understanding of ways to increase self-esteem.   Intervention: Personalized Plate- printed license plate template, markers, colored pencils   Activity: Personalized Plate.  LRT talked with patients about the importance of healthy self esteem and what influences it.  Patients were to then design a license plate that highlighted some of their positive attributes, people who are important to them, achievements, things they hope to accomplish and dates of things they have achieved.   Education: LRT educated patients on the importance of healthy self-esteem and ways to build self-esteem.  Education Outcome: Acknowledges education/In group clarification offered   Clinical Observations/Feedback: Pt did not attend group session.     Rikki Trosper, LRT/CTRS         Leiana Rund A 03/18/2020 11:45 AM 

## 2020-03-18 NOTE — Tx Team (Signed)
Interdisciplinary Treatment and Diagnostic Plan Update  03/18/2020 Time of Session: 9:30am Andrea Pugh MRN: [id number]  Principal Diagnosis: Schizophrenia spectrum disorder with psychotic disorder type not yet determined [facility])  Secondary Diagnoses: Principal Problem:   Schizophrenia spectrum disorder with psychotic disorder type not yet determined (HCC)   Current Medications:  Current Facility-Administered Medications  Medication Dose Route Frequency Provider Last Rate Last Admin  . acetaminophen (TYLENOL) tablet 650 mg  650 mg Oral Q6H PRN Estella Husk, MD      . alum & mag hydroxide-simeth (MAALOX/MYLANTA) 200-200-20 MG/5ML suspension 30 mL  30 mL Oral Q4H PRN Estella Husk, MD      . escitalopram (LEXAPRO) tablet 20 mg  20 mg Oral Daily Antonieta Pert, MD   20 mg at 03/18/20 0912  . hydrOXYzine (ATARAX/VISTARIL) tablet 25 mg  25 mg Oral TID PRN Estella Husk, MD   25 mg at 03/17/20 2042  . LORazepam (ATIVAN) tablet 1 mg  1 mg Oral Q6H PRN Antonieta Pert, MD       And  . risperiDONE (RISPERDAL M-TABS) disintegrating tablet 2 mg  2 mg Oral Q8H PRN Antonieta Pert, MD       And  . ziprasidone (GEODON) injection 20 mg  20 mg Intramuscular Q6H PRN Antonieta Pert, MD      . magnesium hydroxide (MILK OF MAGNESIA) suspension 30 mL  30 mL Oral Daily PRN Estella Husk, MD      . meclizine (ANTIVERT) tablet 12.5 mg  12.5 mg Oral BID PRN Antonieta Pert, MD      . risperiDONE (RISPERDAL M-TABS) disintegrating tablet 1 mg  1 mg Oral Daily Antonieta Pert, MD   1 mg at 03/18/20 0912  . risperidone (RISPERDAL M-TABS) disintegrating tablet 3 mg  3 mg Oral QHS Antonieta Pert, MD   3 mg at 03/17/20 2042  . traZODone (DESYREL) tablet 25 mg  25 mg Oral QHS PRN Antonieta Pert, MD   25 mg at 03/17/20 2042   PTA Medications: No medications prior to admission.    Patient Stressors: Medication change or noncompliance Other: Catina would not  answer any more  Patient Strengths: Physical Health Supportive family/friends  Treatment Modalities: Medication Management, Group therapy, Case management,  1 to 1 session with clinician, Psychoeducation, Recreational therapy.   Physician Treatment Plan for Primary Diagnosis: Schizophrenia spectrum disorder with psychotic disorder type not yet determined (HCC) Long Term Goal(s): Improvement in symptoms so as ready for discharge   Short Term Goals: Ability to identify changes in lifestyle to reduce recurrence of condition will improve Ability to demonstrate self-control will improve  Medication Management: Evaluate patient's response, side effects, and tolerance of medication regimen.  Therapeutic Interventions: 1 to 1 sessions, Unit Group sessions and Medication administration.  Evaluation of Outcomes: Progressing  Physician Treatment Plan for Secondary Diagnosis: Principal Problem:   Schizophrenia spectrum disorder with psychotic disorder type not yet determined (HCC)  Long Term Goal(s): Improvement in symptoms so as ready for discharge   Short Term Goals: Ability to identify changes in lifestyle to reduce recurrence of condition will improve Ability to demonstrate self-control will improve     Medication Management: Evaluate patient's response, side effects, and tolerance of medication regimen.  Therapeutic Interventions: 1 to 1 sessions, Unit Group sessions and Medication administration.  Evaluation of Outcomes: Progressing   RN Treatment Plan for Primary Diagnosis: Schizophrenia spectrum disorder with psychotic disorder type not yet determined (HCC) Long Term  Goal(s): Knowledge of disease and therapeutic regimen to maintain health will improve  Short Term Goals: Ability to demonstrate self-control, Ability to participate in decision making will improve and Ability to verbalize feelings will improve  Medication Management: RN will administer medications as ordered by  provider, will assess and evaluate patient's response and provide education to patient for prescribed medication. RN will report any adverse and/or side effects to prescribing provider.  Therapeutic Interventions: 1 on 1 counseling sessions, Psychoeducation, Medication administration, Evaluate responses to treatment, Monitor vital signs and CBGs as ordered, Perform/monitor CIWA, COWS, AIMS and Fall Risk screenings as ordered, Perform wound care treatments as ordered.  Evaluation of Outcomes: Progressing   LCSW Treatment Plan for Primary Diagnosis: Schizophrenia spectrum disorder with psychotic disorder type not yet determined (HCC) Long Term Goal(s): Safe transition to appropriate next level of care at discharge, Engage patient in therapeutic group addressing interpersonal concerns.  Short Term Goals: Engage patient in aftercare planning with referrals and resources, Increase social support and Increase ability to appropriately verbalize feelings  Therapeutic Interventions: Assess for all discharge needs, 1 to 1 time with Social worker, Explore available resources and support systems, Assess for adequacy in community support network, Educate family and significant other(s) on suicide prevention, Complete Psychosocial Assessment, Interpersonal group therapy.  Evaluation of Outcomes: Progressing   Progress in Treatment: Attending groups: Yes. and No. Participating in groups: No. Taking medication as prescribed: Yes. Toleration medication: Yes. Family/Significant other contact made: No, will contact:  patient declined consents  Patient understands diagnosis: No. Discussing patient identified problems/goals with staff: Yes. Medical problems stabilized or resolved: Yes. Denies suicidal/homicidal ideation: Yes. Issues/concerns per patient self-inventory: No. Other: None  New problem(s) identified: No, Describe:  none  New Short Term/Long Term Goal(s):medication stabilization, elimination of  SI thoughts, development of comprehensive mental wellness plan.  Patient Goals:  "to work on my breathing techniques."  Discharge Plan or Barriers: Patient plans to return to her apartment with her roommates. Patient declined all follow up at this time. Patient states she receives outpatient services through [facility], however declined release to contact them.   Reason for Continuation of Hospitalization: Delusions  Medication stabilization  Estimated Length of Stay: 1-3 day  Attendees: Patient:  03/18/2020   Physician:  03/18/2020   Nursing:  03/18/2020   RN Care Manager: 03/18/2020   Social Worker: Ruthann Cancer, LCSW 03/18/2020   Recreational Therapist:  03/18/2020   Other:  03/18/2020   Other:  03/18/2020   Other: 03/18/2020       Scribe for Treatment Team: Otelia Santee, LCSW 03/18/2020 9:45 AM

## 2020-03-18 NOTE — Progress Notes (Signed)
  [facility] Adult Case Management Discharge Plan :  Will you be returning to the same living situation after discharge:  Yes,  to apartment At discharge, do you have transportation home?: Yes,  roommate to pick this patient up Do you have the ability to pay for your medications: No. Samples to be provided at discharge  Release of information consent forms completed and in the chart;  Patient's signature needed at discharge.  Patient to Follow up at:  Follow-up Information    [facility] Follow up.   Contact information: [address] [place] [address] [phone]       [facility] Follow up.   Specialty: Behavioral Health Contact information: [address] [place] [address] [phone]              Next level of care provider has access to [facility] Link:yes  Safety Planning and Suicide Prevention discussed: Yes,  with patient  Have you used any form of tobacco in the last 30 days? (Cigarettes, Smokeless Tobacco, Cigars, and/or Pipes): No  Has patient been referred to the Quitline?: N/A patient is not a smoker  Patient has been referred for addiction treatment: N/A  Otelia Santee, LCSW 03/18/2020, 12:10 PM

## 2020-03-21 ENCOUNTER — Other Ambulatory Visit: Payer: Self-pay

## 2020-03-21 ENCOUNTER — Encounter (HOSPITAL_COMMUNITY): Payer: Self-pay | Admitting: Emergency Medicine

## 2020-03-21 ENCOUNTER — Ambulatory Visit (HOSPITAL_COMMUNITY)
Admission: EM | Admit: 2020-03-21 | Discharge: 2020-03-22 | Disposition: A | Discharge status: Home / Self Care | Payer: No Payment, Other | Attending: Psychiatry | Admitting: Psychiatry

## 2020-03-21 DIAGNOSIS — F419 Anxiety disorder, unspecified: Secondary | ICD-10-CM | POA: Diagnosis not present

## 2020-03-21 DIAGNOSIS — G4700 Insomnia, unspecified: Secondary | ICD-10-CM | POA: Insufficient documentation

## 2020-03-21 DIAGNOSIS — Z20822 Contact with and (suspected) exposure to covid-19: Secondary | ICD-10-CM | POA: Diagnosis not present

## 2020-03-21 DIAGNOSIS — R454 Irritability and anger: Secondary | ICD-10-CM | POA: Insufficient documentation

## 2020-03-21 DIAGNOSIS — F410 Panic disorder [episodic paroxysmal anxiety] without agoraphobia: Secondary | ICD-10-CM | POA: Diagnosis not present

## 2020-03-21 DIAGNOSIS — F209 Schizophrenia, unspecified: Secondary | ICD-10-CM | POA: Insufficient documentation

## 2020-03-21 DIAGNOSIS — R450 Nervousness: Secondary | ICD-10-CM | POA: Insufficient documentation

## 2020-03-21 DIAGNOSIS — F1290 Cannabis use, unspecified, uncomplicated: Secondary | ICD-10-CM | POA: Insufficient documentation

## 2020-03-21 DIAGNOSIS — Z79899 Other long term (current) drug therapy: Secondary | ICD-10-CM | POA: Insufficient documentation

## 2020-03-21 LAB — CBC WITH DIFFERENTIAL/PLATELET
Abs Immature Granulocytes: 0.03 10*3/uL (ref 0.00–0.07)
Basophils Absolute: 0 10*3/uL (ref 0.0–0.1)
Basophils Relative: 0 %
Eosinophils Absolute: 0 10*3/uL (ref 0.0–0.5)
Eosinophils Relative: 0 %
HCT: 33.2 % — ABNORMAL LOW (ref 36.0–46.0)
Hemoglobin: 11.6 g/dL — ABNORMAL LOW (ref 12.0–15.0)
Immature Granulocytes: 0 %
Lymphocytes Relative: 25 %
Lymphs Abs: 2.1 10*3/uL (ref 0.7–4.0)
MCH: 31.4 pg (ref 26.0–34.0)
MCHC: 34.9 g/dL (ref 30.0–36.0)
MCV: 90 fL (ref 80.0–100.0)
Monocytes Absolute: 0.8 10*3/uL (ref 0.1–1.0)
Monocytes Relative: 9 %
Neutro Abs: 5.7 10*3/uL (ref 1.7–7.7)
Neutrophils Relative %: 66 %
Platelets: 318 10*3/uL (ref 150–400)
RBC: 3.69 MIL/uL — ABNORMAL LOW (ref 3.87–5.11)
RDW: 13.1 % (ref 11.5–15.5)
WBC: 8.7 10*3/uL (ref 4.0–10.5)
nRBC: 0 % (ref 0.0–0.2)

## 2020-03-21 LAB — COMPREHENSIVE METABOLIC PANEL
ALT: 19 U/L (ref 0–44)
AST: 18 U/L (ref 15–41)
Albumin: 3.8 g/dL (ref 3.5–5.0)
Alkaline Phosphatase: 59 U/L (ref 38–126)
Anion gap: 14 (ref 5–15)
BUN: 8 mg/dL (ref 6–20)
CO2: 21 mmol/L — ABNORMAL LOW (ref 22–32)
Calcium: 9.1 mg/dL (ref 8.9–10.3)
Chloride: 101 mmol/L (ref 98–111)
Creatinine, Ser: 0.54 mg/dL (ref 0.44–1.00)
GFR, Estimated: >60 mL/min (ref >60)
Glucose, Bld: 107 mg/dL — ABNORMAL HIGH (ref 70–99)
Potassium: 3.5 mmol/L (ref 3.5–5.1)
Sodium: 136 mmol/L (ref 135–145)
Total Bilirubin: 1 mg/dL (ref 0.3–1.2)
Total Protein: 7.3 g/dL (ref 6.5–8.1)

## 2020-03-21 LAB — POCT URINE DRUG SCREEN - MANUAL ENTRY (I-SCREEN)
POC Amphetamine UR: NOT DETECTED
POC Buprenorphine (BUP): NOT DETECTED
POC Cocaine UR: NOT DETECTED
POC Marijuana UR: NOT DETECTED
POC Methadone UR: NOT DETECTED
POC Methamphetamine UR: NOT DETECTED
POC Morphine: NOT DETECTED
POC Oxazepam (BZO): NOT DETECTED
POC Oxycodone UR: NOT DETECTED
POC Secobarbital (BAR): NOT DETECTED

## 2020-03-21 LAB — RESP PANEL BY RT-PCR (FLU A&B, COVID) ARPGX2
Influenza A by PCR: NEGATIVE
Influenza B by PCR: NEGATIVE
SARS Coronavirus 2 by RT PCR: NEGATIVE

## 2020-03-21 LAB — POCT PREGNANCY, URINE: Preg Test, Ur: NEGATIVE

## 2020-03-21 LAB — POC SARS CORONAVIRUS 2 AG -  ED: SARS Coronavirus 2 Ag: NEGATIVE

## 2020-03-21 LAB — POC SARS CORONAVIRUS 2 AG: SARS Coronavirus 2 Ag: NEGATIVE

## 2020-03-21 MED ORDER — ESCITALOPRAM OXALATE 20 MG PO TABS
20.0000 mg | ORAL_TABLET | Freq: Every day | ORAL | Status: DC
Start: 2020-03-21 — End: 2020-03-22
  Administered 2020-03-21 – 2020-03-22 (×2): 20 mg via ORAL
  Filled 2020-03-21 (×2): qty 2
  Filled 2020-03-21: qty 7

## 2020-03-21 MED ORDER — HYDROXYZINE HCL 25 MG PO TABS
25.0000 mg | ORAL_TABLET | Freq: Three times a day (TID) | ORAL | Status: DC | PRN
  Administered 2020-03-21: 25 mg via ORAL
  Filled 2020-03-21: qty 1

## 2020-03-21 MED ORDER — ALUM & MAG HYDROXIDE-SIMETH 200-200-20 MG/5ML PO SUSP: 30.0000 mL | ORAL | Status: DC | PRN

## 2020-03-21 MED ORDER — ACETAMINOPHEN 325 MG PO TABS: 650.0000 mg | ORAL_TABLET | Freq: Four times a day (QID) | ORAL | Status: DC | PRN

## 2020-03-21 MED ORDER — RISPERIDONE 1 MG PO TABS
1.0000 mg | ORAL_TABLET | Freq: Every day | ORAL | Status: DC
  Administered 2020-03-21 – 2020-03-22 (×2): 1 mg via ORAL
  Filled 2020-03-21: qty 1
  Filled 2020-03-21: qty 35
  Filled 2020-03-21: qty 1

## 2020-03-21 MED ORDER — MAGNESIUM HYDROXIDE 400 MG/5ML PO SUSP: 30.0000 mL | Freq: Every day | ORAL | Status: DC | PRN

## 2020-03-21 MED ORDER — TRAZODONE HCL 50 MG PO TABS
50.0000 mg | ORAL_TABLET | Freq: Every evening | ORAL | Status: DC | PRN
  Administered 2020-03-21: 50 mg via ORAL
  Filled 2020-03-21: qty 1

## 2020-03-21 MED ORDER — RISPERIDONE 2 MG PO TABS
4.0000 mg | ORAL_TABLET | Freq: Every day | ORAL | Status: DC
  Administered 2020-03-21: 4 mg via ORAL
  Filled 2020-03-21: qty 2

## 2020-03-21 NOTE — ED Triage Notes (Signed)
EMS transport, pt presents with anxiety/panic attack.  Pt requests family to be contacted for collateral information.  Denies SI, HI or AVH.

## 2020-03-21 NOTE — ED Notes (Signed)
Pt very guarded and not forthcoming with information, stating she is sleepy and wants to lay down.  Denies SI or HI.  Monitoring for safety.

## 2020-03-21 NOTE — ED Notes (Signed)
Pt calm and cooperative. Will continue to monitor for safety

## 2020-03-21 NOTE — Progress Notes (Signed)
Received Andrea Pugh this AM in her chair bed awake at intervals and affect is frustration. Later she endorsed feeling extremely tired and denied all other psychiatric symptoms. She continued to rest in er bed with her eyes closed at intervals but not sleeping.

## 2020-03-21 NOTE — ED Notes (Signed)
Pt resting in no acute distress. RR even and unlabored. Safety maintained. 

## 2020-03-21 NOTE — BH Assessment (Signed)
Patient's godmother, Andrea Pugh was contacted.  Her number is [phone].  She is in [place].  She said that patient does live by herself.  She said that patient has not been in contact with her for the last few days.  Godmother said that patient has made statements in the past about wanting to kill herself but has never attempted it.

## 2020-03-21 NOTE — ED Provider Notes (Signed)
Behavioral Health Progress Note  Date and Time: 03/21/2020 4:54 PM Name: Mataya Kilduff MRN:  [id number]  Subjective:   Patient interviewed this AM. She appears irritable, frightened. She becomes easily frustrated and tearful throughout interview. Pt initially states that "I think I am feeling better" but then begins to cry and states that "I have bipolar issues because I am smiling all the time". She recalls recent hospitalization at [facility] and admits that she has forgotten to take her medication when it was due on several occasions "because I was feeling better". She is an overall poor historian and is unable/unwilling to provide answers to most questions. Pt states that she cannot return to where she was living; per documentation, her roommate Peggye Pitt picked her up from [facility] on 2/11. When asked for consent to call roommate she states yes but then becomes tearful stating that her roommate is dead. She requested that I stop asking her questions so that she could sleep as she has not slept "in awhile".  RoommateGwendel Hanson [phone]) Called' no answer. Left hippa compliant VM  Diagnosis:  Final diagnoses:  Schizophrenia, unspecified type (HCC)    Total Time spent with patient: 20 minutes  Past Psychiatric History: schizophrenia Past Medical History:  Past Medical History:  Diagnosis Date  . Anxiety   . Depression    History reviewed. No pertinent surgical history. Family History: History reviewed. No pertinent family history. Family Psychiatric  History: see H&P Social History:  Social History   Substance and Sexual Activity  Alcohol Use Yes   Comment: occasional     Social History   Substance and Sexual Activity  Drug Use Yes  . Types: Marijuana    Social History   Socioeconomic History  . Marital status: Single    Spouse name: Not on file  . Number of children: Not on file  . Years of education: Not on file  . Highest education level: Not on file  Occupational  History  . Occupation: Consulting civil engineer  Tobacco Use  . Smoking status: Never Smoker  . Smokeless tobacco: Never Used  Vaping Use  . Vaping Use: Never used  Substance and Sexual Activity  . Alcohol use: Yes    Comment: occasional  . Drug use: Yes    Types: Marijuana  . Sexual activity: Not Currently  Other Topics Concern  . Not on file  Social History Narrative   Pt currently lives in student housing at [employer].  She is currently contemplating continuing her education.  She also works at a [employer].  Pt currently followed by [facility].   Social Determinants of Health   Financial Resource Strain: Not on file  Food Insecurity: Not on file  Transportation Needs: Not on file  Physical Activity: Not on file  Stress: Not on file  Social Connections: Not on file   SDOH:  SDOH Screenings   Alcohol Screen: Low Risk   . Last Alcohol Screening Score (AUDIT): 0  Depression (PHQ2-9): Not on file  Financial Resource Strain: Not on file  Food Insecurity: Not on file  Housing: Not on file  Physical Activity: Not on file  Social Connections: Not on file  Stress: Not on file  Tobacco Use: Low Risk   . Smoking Tobacco Use: Never Smoker  . Smokeless Tobacco Use: Never Used  Transportation Needs: Not on file   Additional Social History:    Pain Medications: None Prescriptions: See d/c list from stay at [facility] on 02/01. Over the Counter: None History  of alcohol / drug use?: No history of alcohol / drug abuse                    Sleep: Poor  Appetite:  Fair  Current Medications:  Current Facility-Administered Medications  Medication Dose Route Frequency Provider Last Rate Last Admin  . acetaminophen (TYLENOL) tablet 650 mg  650 mg Oral Q6H PRN Jaclyn Shaggy, PA-C      . alum & mag hydroxide-simeth (MAALOX/MYLANTA) 200-200-20 MG/5ML suspension 30 mL  30 mL Oral Q4H PRN Melbourne Abts W, PA-C      . escitalopram (LEXAPRO) tablet 20 mg  20 mg Oral Daily Melbourne Abts W, PA-C   20 mg  at 03/21/20 1610  . hydrOXYzine (ATARAX/VISTARIL) tablet 25 mg  25 mg Oral TID PRN Jaclyn Shaggy, PA-C      . magnesium hydroxide (MILK OF MAGNESIA) suspension 30 mL  30 mL Oral Daily PRN Melbourne Abts W, PA-C      . risperiDONE (RISPERDAL) tablet 1 mg  1 mg Oral Daily Melbourne Abts W, PA-C   1 mg at 03/21/20 9604  . risperiDONE (RISPERDAL) tablet 4 mg  4 mg Oral QHS Ladona Ridgel, Cody W, PA-C      . traZODone (DESYREL) tablet 50 mg  50 mg Oral QHS PRN Jaclyn Shaggy, PA-C       Current Outpatient Medications  Medication Sig Dispense Refill  . escitalopram (LEXAPRO) 20 MG tablet Take 1 tablet (20 mg total) by mouth daily. 30 tablet 0  . risperiDONE (RISPERDAL) 1 MG tablet Take 1 tablet (1 mg total) by mouth daily. 30 tablet 0  . risperiDONE (RISPERDAL) 4 MG tablet Take 1 tablet (4 mg total) by mouth at bedtime. 60 tablet 0    Labs  Lab Results:  Admission on 03/21/2020  Component Date Value Ref Range Status  . SARS Coronavirus 2 by RT PCR 03/21/2020 NEGATIVE  NEGATIVE Final   Comment: (NOTE) SARS-CoV-2 target nucleic acids are NOT DETECTED.  The SARS-CoV-2 RNA is generally detectable in upper respiratory specimens during the acute phase of infection. The lowest concentration of SARS-CoV-2 viral copies this assay can detect is 138 copies/mL. A negative result does not preclude SARS-Cov-2 infection and should not be used as the sole basis for treatment or other patient management decisions. A negative result may occur with  improper specimen collection/handling, submission of specimen other than nasopharyngeal swab, presence of viral mutation(s) within the areas targeted by this assay, and inadequate number of viral copies(<138 copies/mL). A negative result must be combined with clinical observations, patient history, and epidemiological information. The expected result is Negative.  Fact Sheet for Patients:  BloggerCourse.com  Fact Sheet for Healthcare Providers:   SeriousBroker.it  This test is no                          t yet approved or cleared by the [place] FDA and  has been authorized for detection and/or diagnosis of SARS-CoV-2 by FDA under an Emergency Use Authorization (EUA). This EUA will remain  in effect (meaning this test can be used) for the duration of the COVID-19 declaration under Section 564(b)(1) of the Act, 21 U.S.C.section 360bbb-3(b)(1), unless the authorization is terminated  or revoked sooner.      . Influenza A by PCR 03/21/2020 NEGATIVE  NEGATIVE Final  . Influenza B by PCR 03/21/2020 NEGATIVE  NEGATIVE Final   Comment: (NOTE) The Xpert Xpress SARS-CoV-2/FLU/RSV  plus assay is intended as an aid in the diagnosis of influenza from Nasopharyngeal swab specimens and should not be used as a sole basis for treatment. Nasal washings and aspirates are unacceptable for Xpert Xpress SARS-CoV-2/FLU/RSV testing.  Fact Sheet for Patients: BloggerCourse.com  Fact Sheet for Healthcare Providers: SeriousBroker.it  This test is not yet approved or cleared by the [place] FDA and has been authorized for detection and/or diagnosis of SARS-CoV-2 by FDA under an Emergency Use Authorization (EUA). This EUA will remain in effect (meaning this test can be used) for the duration of the COVID-19 declaration under Section 564(b)(1) of the Act, 21 U.S.C. section 360bbb-3(b)(1), unless the authorization is terminated or revoked.  Performed at [facility], [address] [place], [place] [address]   . SARS Coronavirus 2 Ag 03/21/2020 Negative  Negative Preliminary  . WBC 03/21/2020 8.7  4.0 - 10.5 K/uL Final  . RBC 03/21/2020 3.69* 3.87 - 5.11 MIL/uL Final  . Hemoglobin 03/21/2020 11.6* 12.0 - 15.0 g/dL Final  . HCT 58/85/0277 33.2* 36.0 - 46.0 % Final  . MCV 03/21/2020 90.0  80.0 - 100.0 fL Final  . MCH 03/21/2020 31.4  26.0 - 34.0 pg Final   . MCHC 03/21/2020 34.9  30.0 - 36.0 g/dL Final  . RDW 41/28/7867 13.1  11.5 - 15.5 % Final  . Platelets 03/21/2020 318  150 - 400 K/uL Final  . nRBC 03/21/2020 0.0  0.0 - 0.2 % Final  . Neutrophils Relative % 03/21/2020 66  % Final  . Neutro Abs 03/21/2020 5.7  1.7 - 7.7 K/uL Final  . Lymphocytes Relative 03/21/2020 25  % Final  . Lymphs Abs 03/21/2020 2.1  0.7 - 4.0 K/uL Final  . Monocytes Relative 03/21/2020 9  % Final  . Monocytes Absolute 03/21/2020 0.8  0.1 - 1.0 K/uL Final  . Eosinophils Relative 03/21/2020 0  % Final  . Eosinophils Absolute 03/21/2020 0.0  0.0 - 0.5 K/uL Final  . Basophils Relative 03/21/2020 0  % Final  . Basophils Absolute 03/21/2020 0.0  0.0 - 0.1 K/uL Final  . Immature Granulocytes 03/21/2020 0  % Final  . Abs Immature Granulocytes 03/21/2020 0.03  0.00 - 0.07 K/uL Final   Performed at [facility], [address] [place], [place] [address]  . Sodium 03/21/2020 136  135 - 145 mmol/L Final  . Potassium 03/21/2020 3.5  3.5 - 5.1 mmol/L Final  . Chloride 03/21/2020 101  98 - 111 mmol/L Final  . CO2 03/21/2020 21* 22 - 32 mmol/L Final  . Glucose, Bld 03/21/2020 107* 70 - 99 mg/dL Final   Glucose reference range applies only to samples taken after fasting for at least 8 hours.  . BUN 03/21/2020 8  6 - 20 mg/dL Final  . Creatinine, Ser 03/21/2020 0.54  0.44 - 1.00 mg/dL Final  . Calcium 47/10/6281 9.1  8.9 - 10.3 mg/dL Final  . Total Protein 03/21/2020 7.3  6.5 - 8.1 g/dL Final  . Albumin 66/29/4765 3.8  3.5 - 5.0 g/dL Final  . AST 46/50/3546 18  15 - 41 U/L Final  . ALT 03/21/2020 19  0 - 44 U/L Final  . Alkaline Phosphatase 03/21/2020 59  38 - 126 U/L Final  . Total Bilirubin 03/21/2020 1.0  0.3 - 1.2 mg/dL Final  . GFR, Estimated 03/21/2020 >60  >60 mL/min Final   Comment: (NOTE) Calculated using the CKD-EPI Creatinine Equation (2021)   . Anion gap 03/21/2020 14  5 -  15 Final   Performed at [facility], [address] [place], [place]  [id number]  . POC Amphetamine UR 03/21/2020 None Detected  NONE DETECTED (Cut Off Level 1000 ng/mL) Preliminary  . POC Secobarbital (BAR) 03/21/2020 None Detected  NONE DETECTED (Cut Off Level 300 ng/mL) Preliminary  . POC Buprenorphine (BUP) 03/21/2020 None Detected  NONE DETECTED (Cut Off Level 10 ng/mL) Preliminary  . POC Oxazepam (BZO) 03/21/2020 None Detected  NONE DETECTED (Cut Off Level 300 ng/mL) Preliminary  . POC Cocaine UR 03/21/2020 None Detected  NONE DETECTED (Cut Off Level 300 ng/mL) Preliminary  . POC Methamphetamine UR 03/21/2020 None Detected  NONE DETECTED (Cut Off Level 1000 ng/mL) Preliminary  . POC Morphine 03/21/2020 None Detected  NONE DETECTED (Cut Off Level 300 ng/mL) Preliminary  . POC Oxycodone UR 03/21/2020 None Detected  NONE DETECTED (Cut Off Level 100 ng/mL) Preliminary  . POC Methadone UR 03/21/2020 None Detected  NONE DETECTED (Cut Off Level 300 ng/mL) Preliminary  . POC Marijuana UR 03/21/2020 None Detected  NONE DETECTED (Cut Off Level 50 ng/mL) Preliminary  . SARS Coronavirus 2 Ag 03/21/2020 NEGATIVE  NEGATIVE Final   Comment: (NOTE) SARS-CoV-2 antigen NOT DETECTED.   Negative results are presumptive.  Negative results do not preclude SARS-CoV-2 infection and should not be used as the sole basis for treatment or other patient management decisions, including infection  control decisions, particularly in the presence of clinical signs and  symptoms consistent with COVID-19, or in those who have been in contact with the virus.  Negative results must be combined with clinical observations, patient history, and epidemiological information. The expected result is Negative.  Fact Sheet for Patients: https://www.jennings-kim.com/https://www.fda.gov/media/141569/download  Fact Sheet for Healthcare Providers: https://alexander-rogers.biz/https://www.fda.gov/media/141568/download  This test is not yet approved or cleared by the [place] FDA and  has been authorized for detection and/or diagnosis of SARS-CoV-2 by FDA  under an Emergency Use Authorization (EUA).  This EUA will remain in effect (meaning this test can be used) for the duration of  the COV                          ID-19 declaration under Section 564(b)(1) of the Act, 21 U.S.C. section 360bbb-3(b)(1), unless the authorization is terminated or revoked sooner.    . Preg Test, Ur 03/21/2020 NEGATIVE  NEGATIVE Final   Comment:        THE SENSITIVITY OF THIS METHODOLOGY IS >24 mIU/mL   Admission on 03/08/2020, Discharged on 03/18/2020  Component Date Value Ref Range Status  . Color, Urine 03/08/2020 AMBER* YELLOW Final   BIOCHEMICALS MAY BE AFFECTED BY COLOR  . APPearance 03/08/2020 CLOUDY* CLEAR Final  . Specific Gravity, Urine 03/08/2020 1.032* 1.005 - 1.030 Final  . pH 03/08/2020 5.0  5.0 - 8.0 Final  . Glucose, UA 03/08/2020 NEGATIVE  NEGATIVE mg/dL Final  . Hgb urine dipstick 03/08/2020 SMALL* NEGATIVE Final  . Bilirubin Urine 03/08/2020 NEGATIVE  NEGATIVE Final  . Ketones, ur 03/08/2020 20* NEGATIVE mg/dL Final  . Protein, ur 82/95/621302/02/2020 100* NEGATIVE mg/dL Final  . Nitrite 08/65/784602/02/2020 NEGATIVE  NEGATIVE Final  . Glori LuisLeukocytes,Ua 03/08/2020 NEGATIVE  NEGATIVE Final  . RBC / HPF 03/08/2020 11-20  0 - 5 RBC/hpf Final  . WBC, UA 03/08/2020 6-10  0 - 5 WBC/hpf Final  . Bacteria, UA 03/08/2020 FEW* NONE SEEN Final  . Squamous Epithelial / LPF 03/08/2020 6-10  0 - 5 Final  . Mucus 03/08/2020 PRESENT  Final   Performed at [facility], [address] [place], [place] [address]  . Preg Test, Ur 03/08/2020 NEGATIVE  NEGATIVE Final   Comment: THE SENSITIVITY OF THIS METHODOLOGY IS >20 mIU/mL. Performed at [facility], [address] [place], [place] [address]   . Opiates 03/08/2020 NONE DETECTED  NONE DETECTED Final  . Cocaine 03/08/2020 NONE DETECTED  NONE DETECTED Final  . Benzodiazepines 03/08/2020 NONE DETECTED  NONE DETECTED Final  . Amphetamines 03/08/2020 NONE DETECTED  NONE DETECTED Final  .  Tetrahydrocannabinol 03/08/2020 NONE DETECTED  NONE DETECTED Final  . Barbiturates 03/08/2020 NONE DETECTED  NONE DETECTED Final   Comment: (NOTE) DRUG SCREEN FOR MEDICAL PURPOSES ONLY.  IF CONFIRMATION IS NEEDED FOR ANY PURPOSE, NOTIFY LAB WITHIN 5 DAYS.  LOWEST DETECTABLE LIMITS FOR URINE DRUG SCREEN Drug Class                     Cutoff (ng/mL) Amphetamine and metabolites    1000 Barbiturate and metabolites    200 Benzodiazepine                 200 Tricyclics and metabolites     300 Opiates and metabolites        300 Cocaine and metabolites        300 THC                            50 Performed at [facility], [address], [place], [place] [address]   . Color, Urine 03/09/2020 YELLOW  YELLOW Final  . APPearance 03/09/2020 HAZY* CLEAR Final  . Specific Gravity, Urine 03/09/2020 1.017  1.005 - 1.030 Final  . pH 03/09/2020 6.0  5.0 - 8.0 Final  . Glucose, UA 03/09/2020 NEGATIVE  NEGATIVE mg/dL Final  . Hgb urine dipstick 03/09/2020 NEGATIVE  NEGATIVE Final  . Bilirubin Urine 03/09/2020 NEGATIVE  NEGATIVE Final  . Ketones, ur 03/09/2020 NEGATIVE  NEGATIVE mg/dL Final  . Protein, ur 03/50/0938 NEGATIVE  NEGATIVE mg/dL Final  . Nitrite 18/29/9371 NEGATIVE  NEGATIVE Final  . Glori Luis 03/09/2020 NEGATIVE  NEGATIVE Final  . RBC / HPF 03/09/2020 0-5  0 - 5 RBC/hpf Final  . WBC, UA 03/09/2020 6-10  0 - 5 WBC/hpf Final  . Bacteria, UA 03/09/2020 RARE* NONE SEEN Final  . Squamous Epithelial / LPF 03/09/2020 11-20  0 - 5 Final  . Mucus 03/09/2020 PRESENT   Final   Performed at [facility], [address], [place], [place] [address]  . Specimen Description 03/09/2020    Final                   Value:URINE, CLEAN CATCH Performed at [facility], [address], [place], [place] [address]   . Special Requests 03/09/2020    Final                   Value:NONE Performed at [facility], [address] [place], [place] [address]   . Culture 03/09/2020 MULTIPLE SPECIES PRESENT, SUGGEST RECOLLECTION*  Final  . Report Status 03/09/2020 03/13/2020 FINAL   Final  . Total Protein 03/10/2020 8.0  6.5 - 8.1 g/dL Final  . Albumin 25/85/2778 4.4  3.5 - 5.0 g/dL Final  . AST 24/23/5361 17  15 - 41 U/L Final  . ALT 03/10/2020 27  0 - 44 U/L Final  .  Alkaline Phosphatase 03/10/2020 68  38 - 126 U/L Final  . Total Bilirubin 03/10/2020 0.9  0.3 - 1.2 mg/dL Final  . Bilirubin, Direct 03/10/2020 <0.1  0.0 - 0.2 mg/dL Final  . Indirect Bilirubin 03/10/2020 NOT CALCULATED  0.3 - 0.9 mg/dL Final   Performed at [facility], [address], [place], [place] [address]  . WBC 03/10/2020 6.3  4.0 - 10.5 K/uL Final  . RBC 03/10/2020 4.07  3.87 - 5.11 MIL/uL Final  . Hemoglobin 03/10/2020 12.7  12.0 - 15.0 g/dL Final  . HCT 13/09/6576 36.9  36.0 - 46.0 % Final  . MCV 03/10/2020 90.7  80.0 - 100.0 fL Final  . MCH 03/10/2020 31.2  26.0 - 34.0 pg Final  . MCHC 03/10/2020 34.4  30.0 - 36.0 g/dL Final  . RDW 46/96/2952 12.7  11.5 - 15.5 % Final  . Platelets 03/10/2020 411* 150 - 400 K/uL Final  . nRBC 03/10/2020 0.0  0.0 - 0.2 % Final  . Neutrophils Relative % 03/10/2020 57  % Final  . Neutro Abs 03/10/2020 3.6  1.7 - 7.7 K/uL Final  . Lymphocytes Relative 03/10/2020 34  % Final  . Lymphs Abs 03/10/2020 2.1  0.7 - 4.0 K/uL Final  . Monocytes Relative 03/10/2020 9  % Final  . Monocytes Absolute 03/10/2020 0.6  0.1 - 1.0 K/uL Final  . Eosinophils Relative 03/10/2020 0  % Final  . Eosinophils Absolute 03/10/2020 0.0  0.0 - 0.5 K/uL Final  . Basophils Relative 03/10/2020 0  % Final  . Basophils Absolute 03/10/2020 0.0  0.0 - 0.1 K/uL Final  . Immature Granulocytes 03/10/2020 0  % Final  . Abs Immature Granulocytes 03/10/2020 0.02  0.00 - 0.07 K/uL Final   Performed at [facility], [address], [place], [place] [address]  . Specimen Description 03/14/2020    Final                    Value:URINE, CLEAN CATCH Performed at [facility], [address] [place], [place] [address]   . Special Requests 03/14/2020    Final                   Value:NONE Performed at [facility], [address] [place], [place] [address]   . Culture 03/14/2020 *  Final                   Value:<10,000 COLONIES/mL INSIGNIFICANT GROWTH Performed at [facility], [address], [place], [place] [address]   . Report Status 03/14/2020 03/16/2020 FINAL   Final  . WBC 03/16/2020 5.9  4.0 - 10.5 K/uL Final  . RBC 03/16/2020 4.02  3.87 - 5.11 MIL/uL Final  . Hemoglobin 03/16/2020 12.6  12.0 - 15.0 g/dL Final  . HCT 34/74/2595 37.4  36.0 - 46.0 % Final  . MCV 03/16/2020 93.0  80.0 - 100.0 fL Final  . MCH 03/16/2020 31.3  26.0 - 34.0 pg Final  . MCHC 03/16/2020 33.7  30.0 - 36.0 g/dL Final  . RDW 63/87/5643 12.9  11.5 - 15.5 % Final  . Platelets 03/16/2020 358  150 - 400 K/uL Final  . nRBC 03/16/2020 0.0  0.0 - 0.2 % Final  . Neutrophils Relative % 03/16/2020 64  % Final  . Neutro Abs 03/16/2020 3.7  1.7 - 7.7 K/uL Final  . Lymphocytes Relative 03/16/2020 24  % Final  . Lymphs Abs 03/16/2020  1.4  0.7 - 4.0 K/uL Final  . Monocytes Relative 03/16/2020 11  % Final  . Monocytes Absolute 03/16/2020 0.6  0.1 - 1.0 K/uL Final  . Eosinophils Relative 03/16/2020 1  % Final  . Eosinophils Absolute 03/16/2020 0.0  0.0 - 0.5 K/uL Final  . Basophils Relative 03/16/2020 0  % Final  . Basophils Absolute 03/16/2020 0.0  0.0 - 0.1 K/uL Final  . Immature Granulocytes 03/16/2020 0  % Final  . Abs Immature Granulocytes 03/16/2020 0.02  0.00 - 0.07 K/uL Final   Performed at [facility], [address], [place], [place] [address]  . Sodium 03/16/2020 139  135 - 145 mmol/L Final  . Potassium 03/16/2020 4.1  3.5 - 5.1 mmol/L Final  . Chloride 03/16/2020 103  98 - 111 mmol/L Final  . CO2 03/16/2020 24  22 - 32 mmol/L Final  . Glucose, Bld  03/16/2020 112* 70 - 99 mg/dL Final   Glucose reference range applies only to samples taken after fasting for at least 8 hours.  . BUN 03/16/2020 12  6 - 20 mg/dL Final  . Creatinine, Ser 03/16/2020 0.70  0.44 - 1.00 mg/dL Final  . Calcium 78/29/5621 9.3  8.9 - 10.3 mg/dL Final  . Total Protein 03/16/2020 7.3  6.5 - 8.1 g/dL Final  . Albumin 30/86/5784 4.2  3.5 - 5.0 g/dL Final  . AST 69/62/9528 15  15 - 41 U/L Final  . ALT 03/16/2020 20  0 - 44 U/L Final  . Alkaline Phosphatase 03/16/2020 66  38 - 126 U/L Final  . Total Bilirubin 03/16/2020 0.5  0.3 - 1.2 mg/dL Final  . GFR, Estimated 03/16/2020 >60  >60 mL/min Final   Comment: (NOTE) Calculated using the CKD-EPI Creatinine Equation (2021)   . Anion gap 03/16/2020 12  5 - 15 Final   Performed at [facility], [address] [place], [place] [address]  . Glucose-Capillary 03/16/2020 112* 70 - 99 mg/dL Final   Glucose reference range applies only to samples taken after fasting for at least 8 hours.  Admission on 03/07/2020, Discharged on 03/08/2020  Component Date Value Ref Range Status  . SARS Coronavirus 2 by RT PCR 03/07/2020 NEGATIVE  NEGATIVE Final   Comment: (NOTE) SARS-CoV-2 target nucleic acids are NOT DETECTED.  The SARS-CoV-2 RNA is generally detectable in upper respiratory specimens during the acute phase of infection. The lowest concentration of SARS-CoV-2 viral copies this assay can detect is 138 copies/mL. A negative result does not preclude SARS-Cov-2 infection and should not be used as the sole basis for treatment or other patient management decisions. A negative result may occur with  improper specimen collection/handling, submission of specimen other than nasopharyngeal swab, presence of viral mutation(s) within the areas targeted by this assay, and inadequate number of viral copies(<138 copies/mL). A negative result must be combined with clinical observations, patient history, and  epidemiological information. The expected result is Negative.  Fact Sheet for Patients:  BloggerCourse.com  Fact Sheet for Healthcare Providers:  SeriousBroker.it  This test is no                          t yet approved or cleared by the [place] FDA and  has been authorized for detection and/or diagnosis of SARS-CoV-2 by FDA under an Emergency Use Authorization (EUA). This EUA will remain  in effect (meaning this test can be used) for the duration of the COVID-19 declaration  under Section 564(b)(1) of the Act, 21 U.S.C.section 360bbb-3(b)(1), unless the authorization is terminated  or revoked sooner.      . Influenza A by PCR 03/07/2020 NEGATIVE  NEGATIVE Final  . Influenza B by PCR 03/07/2020 NEGATIVE  NEGATIVE Final   Comment: (NOTE) The Xpert Xpress SARS-CoV-2/FLU/RSV plus assay is intended as an aid in the diagnosis of influenza from Nasopharyngeal swab specimens and should not be used as a sole basis for treatment. Nasal washings and aspirates are unacceptable for Xpert Xpress SARS-CoV-2/FLU/RSV testing.  Fact Sheet for Patients: BloggerCourse.com  Fact Sheet for Healthcare Providers: SeriousBroker.it  This test is not yet approved or cleared by the [place] FDA and has been authorized for detection and/or diagnosis of SARS-CoV-2 by FDA under an Emergency Use Authorization (EUA). This EUA will remain in effect (meaning this test can be used) for the duration of the COVID-19 declaration under Section 564(b)(1) of the Act, 21 U.S.C. section 360bbb-3(b)(1), unless the authorization is terminated or revoked.  Performed at [facility], [address], [place], [place] [address]   . SARS Coronavirus 2 Ag 03/07/2020 Negative  Negative Final  . WBC 03/07/2020 8.3  4.0 - 10.5 K/uL Final  . RBC 03/07/2020 4.21  3.87 - 5.11 MIL/uL Final  . Hemoglobin  03/07/2020 12.9  12.0 - 15.0 g/dL Final  . HCT 47/42/5956 37.8  36.0 - 46.0 % Final  . MCV 03/07/2020 89.8  80.0 - 100.0 fL Final  . MCH 03/07/2020 30.6  26.0 - 34.0 pg Final  . MCHC 03/07/2020 34.1  30.0 - 36.0 g/dL Final  . RDW 38/75/6433 12.4  11.5 - 15.5 % Final  . Platelets 03/07/2020 422* 150 - 400 K/uL Final  . nRBC 03/07/2020 0.0  0.0 - 0.2 % Final  . Neutrophils Relative % 03/07/2020 68  % Final  . Neutro Abs 03/07/2020 5.6  1.7 - 7.7 K/uL Final  . Lymphocytes Relative 03/07/2020 24  % Final  . Lymphs Abs 03/07/2020 2.0  0.7 - 4.0 K/uL Final  . Monocytes Relative 03/07/2020 7  % Final  . Monocytes Absolute 03/07/2020 0.6  0.1 - 1.0 K/uL Final  . Eosinophils Relative 03/07/2020 0  % Final  . Eosinophils Absolute 03/07/2020 0.0  0.0 - 0.5 K/uL Final  . Basophils Relative 03/07/2020 0  % Final  . Basophils Absolute 03/07/2020 0.0  0.0 - 0.1 K/uL Final  . Immature Granulocytes 03/07/2020 1  % Final  . Abs Immature Granulocytes 03/07/2020 0.04  0.00 - 0.07 K/uL Final   Performed at [facility], [address] [place], [place] [address]  . Sodium 03/07/2020 138  135 - 145 mmol/L Final  . Potassium 03/07/2020 3.8  3.5 - 5.1 mmol/L Final  . Chloride 03/07/2020 101  98 - 111 mmol/L Final  . CO2 03/07/2020 24  22 - 32 mmol/L Final  . Glucose, Bld 03/07/2020 85  70 - 99 mg/dL Final   Glucose reference range applies only to samples taken after fasting for at least 8 hours.  . BUN 03/07/2020 10  6 - 20 mg/dL Final  . Creatinine, Ser 03/07/2020 0.74  0.44 - 1.00 mg/dL Final  . Calcium 84/16/6063 9.6  8.9 - 10.3 mg/dL Final  . Total Protein 03/07/2020 7.6  6.5 - 8.1 g/dL Final  . Albumin 01/60/1093 4.4  3.5 - 5.0 g/dL Final  . AST 23/55/7322 24  15 - 41 U/L Final  . ALT 03/07/2020 29  0 - 44  U/L Final  . Alkaline Phosphatase 03/07/2020 72  38 - 126 U/L Final  . Total Bilirubin 03/07/2020 1.7* 0.3 - 1.2 mg/dL Final  . GFR, Estimated 03/07/2020 >60  >60 mL/min Final   Comment:  (NOTE) Calculated using the CKD-EPI Creatinine Equation (2021)   . Anion gap 03/07/2020 13  5 - 15 Final   Performed at [facility], [address] [place], [place] [address]  . Hgb A1c MFr Bld 03/07/2020 6.0* 4.8 - 5.6 % Final   Comment: (NOTE) Pre diabetes:          5.7%-6.4%  Diabetes:              >6.4%  Glycemic control for   <7.0% adults with diabetes   . Mean Plasma Glucose 03/07/2020 125.5  mg/dL Final   Performed at [facility], [address] [place], [place] [address]  . Alcohol, Ethyl (B) 03/07/2020 <10  <10 mg/dL Final   Comment: (NOTE) Lowest detectable limit for serum alcohol is 10 mg/dL.  For medical purposes only. Performed at [facility], [address], [place], [place] [address]   . TSH 03/07/2020 0.914  0.350 - 4.500 uIU/mL Final   Comment: Performed by a 3rd Generation assay with a functional sensitivity of <=0.01 uIU/mL. Performed at [facility], [address] [place], [place] [address]   . Cholesterol 03/07/2020 187  0 - 200 mg/dL Final  . Triglycerides 03/07/2020 49  <150 mg/dL Final  . HDL 84/13/2440 58  >40 mg/dL Final  . Total CHOL/HDL Ratio 03/07/2020 3.2  RATIO Final  . VLDL 03/07/2020 10  0 - 40 mg/dL Final  . LDL Cholesterol 03/07/2020 119* 0 - 99 mg/dL Final   Comment:        Total Cholesterol/HDL:CHD Risk Coronary Heart Disease Risk Table                     Men   Women  1/2 Average Risk   3.4   3.3  Average Risk       5.0   4.4  2 X Average Risk   9.6   7.1  3 X Average Risk  23.4   11.0        Use the calculated Patient Ratio above and the CHD Risk Table to determine the patient's CHD Risk.        ATP III CLASSIFICATION (LDL):  <100     mg/dL   Optimal  102-725  mg/dL   Near or Above                    Optimal  130-159  mg/dL   Borderline  366-440  mg/dL   High  >347     mg/dL   Very High Performed at [facility], [address], [place], [place] [address]   . Magnesium 03/07/2020 2.1   1.7 - 2.4 mg/dL Final   Performed at [facility], [address] [place], [place] [address]  . SARS Coronavirus 2 Ag 03/07/2020 NEGATIVE  NEGATIVE Final   Comment: (NOTE) SARS-CoV-2 antigen NOT DETECTED.   Negative results are presumptive.  Negative results do not preclude SARS-CoV-2 infection and should not be used as the sole basis for treatment or other patient management decisions, including infection  control decisions, particularly in the presence of clinical signs and  symptoms consistent with COVID-19, or in those who have been in  contact with the virus.  Negative results must be combined with clinical observations, patient history, and epidemiological information. The expected result is Negative.  Fact Sheet for Patients: https://www.jennings-kim.com/  Fact Sheet for Healthcare Providers: https://alexander-rogers.biz/  This test is not yet approved or cleared by the [place] FDA and  has been authorized for detection and/or diagnosis of SARS-CoV-2 by FDA under an Emergency Use Authorization (EUA).  This EUA will remain in effect (meaning this test can be used) for the duration of  the COV                          ID-19 declaration under Section 564(b)(1) of the Act, 21 U.S.C. section 360bbb-3(b)(1), unless the authorization is terminated or revoked sooner.      Blood Alcohol level:  Lab Results  Component Value Date   ETH <10 03/07/2020   ETH <10 04/20/2019    Metabolic Disorder Labs: Lab Results  Component Value Date   HGBA1C 6.0 (H) 03/07/2020   MPG 125.5 03/07/2020   MPG 114.02 04/02/2019   Lab Results  Component Value Date   PROLACTIN 66.1 (H) 04/13/2019   Lab Results  Component Value Date   CHOL 187 03/07/2020   TRIG 49 03/07/2020   HDL 58 03/07/2020   CHOLHDL 3.2 03/07/2020   VLDL 10 03/07/2020   LDLCALC 119 (H) 03/07/2020   LDLCALC 113 (H) 04/02/2019    Therapeutic Lab Levels: No results found for:  LITHIUM No results found for: VALPROATE No components found for:  CBMZ  Physical Findings   AIMS   Flowsheet Row [facility]) [facility] (Discharged) from OP Visit [facility] Ad[facility] (Discharged) from OP Visit [facility] Ad[facility] (Dis[facility]) [facility]  AIMS Total Score 0 0 0 0    AUDIT   Flowsheet Row [facility]) [facility] Admission (Discharged) from OP Visit [facility] Admission (Discharged) from OP Visit from [facility] Ad[facility] (Discharged) [facility]  Alcohol Use Disorder Identification Test Final Score (AUDIT) 0 3 0 1    Flowsheet Row [facility] 03/21/2020 in [facility]Discharged[facility] ED from 03/07/2020 in [facility]  C-SSRS RISK CATEGORY Error: Q7 should not be populated when Q6 is No Error: Q7 should not be populated when Q6 is No No Risk       Musculoskeletal  Strength & Muscle Tone: within normal limits Gait & Station: normal Patient leans: N/A  Psychiatric Specialty Exam  Presentation  General Appearance: Bizarre; Disheveled  Eye Contact:Poor  Speech:Blocked; Slow  Speech Volume:Decreased  Handedness:Right   Mood and Affect  Mood:Dysphoric; Anxious  Affect:Tearful; Appropriate; Congruent   Thought Process  Thought Processes:Disorganized  Descriptions of Associations:Tangential  Orientation:Full (Time, Place and Person)  Thought Content:Tangential  Hallucinations:Hallucinations:  None  Ideas of Reference:Other (comment)  Suicidal Thoughts:Suicidal Thoughts: No  Homicidal Thoughts:Homicidal Thoughts: No   Sensorium  Memory:Immediate Poor; Recent Poor; Remote Poor  Judgment:Impaired  Insight:Lacking; Poor   Executive Functions  Concentration:Poor  Attention Span:Poor  Recall:Poor  Fund of Knowledge:Fair  Language:Fair   Psychomotor Activity  Psychomotor Activity:Psychomotor Activity: Normal   Assets  Assets:Resilience; Housing   Sleep  Sleep:Sleep: Poor   Physical Exam  Physical Exam Constitutional:      Appearance: Normal appearance.  HENT:     Head: Normocephalic and atraumatic.  Pulmonary:     Effort: Pulmonary effort is normal.  Neurological:     Mental Status: She is alert.    Review of Systems  Constitutional: Negative for chills and fever.  Psychiatric/Behavioral: Positive for depression. Negative for substance abuse and suicidal ideas.   Blood pressure (!) 147/83, pulse (!) 111, temperature (!) 97.1 F (36.2 C), temperature source Tympanic, resp. rate 18, SpO2 99 %. There is no height or weight on file to calculate BMI.  Treatment Plan Summary: Daily contact with patient to assess and evaluate symptoms and progress in treatment and Medication management   -continue home medications as below. Patient admits to intermittent compliance since 2/11 when she was discharged from [facility]. Patient presented early this AM  Appearing paranoid and psychotic. Attempting to restabalize and avoid another admission if able. Will reassess tomorrow need for higher level of care. UDS negative.   -Lexapro 20 mg p.o. daily for MDD  -Risperdal 1 mg p.o. daily for schizophrenia  -Risperdal 4 mg p.o. daily at bedtime for schizophrenia  Estella Husk, MD 03/21/2020 4:54 PM

## 2020-03-21 NOTE — ED Provider Notes (Signed)
Behavioral Health Admission H&P [facility](FBC & OBS)  Date: 03/21/20 Patient Name: Andrea Pugh MRN: [id number] Chief Complaint:  Chief Complaint  Patient presents with  . Anxiety   Chief Complaint/Presenting Problem: Pt [facility].  Patient says she had a panic attack and that it was triggered by a nightmare.  Patient is not able to provide much in the way of details.  She said she has no SI, HI or A/ V halluciantions.  Pt seems to be looking at a fixed point in space at times..  Patient says she has no current provider but had one in January.  Diagnoses:  Final diagnoses:  Schizophrenia, unspecified type (HCC)    HPI: Andrea Pugh is a 25 year old female with a history of MDD and schizophrenia who presents to the behavioral health urgent care voluntarily via EMS for anxiety/panic attack, [facility].  Patient states that she called EMS to bring her to the behavioral health urgent care because she "had a panic attack".  Patient states that this panic attack occurred on the evening of March 21, 2020.  Patient states that she was having a nightmare and she woke up experiencing a panic attack and was "really really scared".  Patient denies any shortness of breath, chest pain, nausea, vomiting, or impending sense of doom associated with this panic attack.  Patient denies SI or past suicide attempts.  She denies any self harming behaviors, such as cutting or burning herself.  She denies HI or AVH.  Per chart review, [facility] for schizophrenia/worsening bizarre behaviors and was discharged on March 18, 2020 with instructions to take the following medications: Lexapro 20 mg p.o. daily for MDD, Risperdal 1 mg p.o. daily for schizophrenia, and Risperdal 4 mg p.o. at bedtime for schizophrenia.  Patient was also given follow-up information for  community health and wellness [facility].  Patient states that she takes her medications as prescribed, but states that she does not sleep much at all because " my medications keep me up".  Patient states that she has not seen a psychiatrist or therapist since January 2022.  When patient is asked who her psychiatrist and therapist were, she is unable to provide an answer.  When patient is asked further questions about her mental health history, she does not answer.  Patient refuses to answer questions about anhedonia, feelings of guilt/hopelessness, energy changes, concentration changes, appetite changes, or weight changes.  Patient refuses to answer questions about home life, access to weapons, or substance use.   With patient's consent, Beatriz StallionMarcus Harvey, LCAS and myself attempted to contact the patient's brother Andrea Pugh: [phone]) twice via phone, but were unsuccessful in doing so.  Patient also gave consent for Beatriz StallionMarcus Harvey and myself to contact the patient's cousin (Andrea Pugh: [phone]). Andrea Pugh did answer our call, but states that she has not talked to the patient in months and was unable to provide much information regarding the patient's mental health status.  Consent was then received from the patient for Beatriz StallionMarcus Harvey and myself to speak with her aunt/godmother Andrea Pugh: [phone] who did answer her call and was able to provide some helpful information regarding the patient.  Information from that obtained collateral is shown below (as written and Berna SpareMarcus Harvey's note):   "Patient gave permission to contact her aunt, Andrea BradfordKimberly. Manson PasseyBrown.  Her number is [phone]. She is in [place].  She said that patient does live by herself. She said that patient has not been in contact with her for the last Pugh days. Godmother said that patient has made statements in the past about wanting to kill herself but has never attempted it."  Ms. Manson Passey also states that the patient has been experiencing serious  declines in mental health over the past couple of months.  Ms. Manson Passey also states that the patient has been experiencing very noticeable AVH over the past couple of months.  On exam, patient is sitting, stating that she is "very tired", but in no acute distress.  Her mood is anxious with congruent affect.  She is mostly uncooperative and refuses to answer most questions.  Patient does appear to be responding to internal stimuli.  Patient will stare at the wall in the exam room and look around the exam room as if she is fixated on something specific.  Patient demonstrates thought blocking.  PHQ 2-9:   Flowsheet Row ED from 03/21/2020 in [facility] Admission (Discharged) from 03/08/2020 in [facility] INPATIENT ADULT [facility]B ED from 03/07/2020 in [facility]  C-SSRS RISK CATEGORY No Risk Error: Q7 should not be populated when Q6 is No No Risk       Total Time spent with patient: 30 minutes  Musculoskeletal  Strength & Muscle Tone: within normal limits Gait & Station: normal Patient leans: N/A  Psychiatric Specialty Exam  Presentation General Appearance: Bizarre; Disheveled  Eye Contact:Poor  Speech:Blocked; Slow  Speech Volume:Decreased  Handedness:Right   Mood and Affect  Mood:Anxious  Affect:Congruent   Thought Process  Thought Processes:Disorganized  Descriptions of Associations:Tangential  Orientation:Full (Time, Place and Person)  Thought Content:Illogical; Scattered  Hallucinations:Hallucinations: -- (Patient denies but per collateral, patient has been experiencing AVH)  Ideas of Reference:None  Suicidal Thoughts:Suicidal Thoughts: No  Homicidal Thoughts:Homicidal Thoughts: No   Sensorium  Memory:Immediate Poor; Recent Poor; Remote Poor  Judgment:Impaired  Insight:Poor; Lacking   Executive Functions  Concentration:Fair  Attention Span:Fair  Recall:Fair  Fund of  Knowledge:Fair  Language:Fair   Psychomotor Activity  Psychomotor Activity:Psychomotor Activity: Normal   Assets  Assets:Desire for Improvement; Housing; Leisure Time; Physical Health   Sleep  Sleep:Sleep: Poor   Physical Exam Vitals reviewed.  Constitutional:      General: She is not in acute distress.    Appearance: She is not ill-appearing, toxic-appearing or diaphoretic.  HENT:     Head: Normocephalic and atraumatic.     Right Ear: External ear normal.     Left Ear: External ear normal.  Cardiovascular:     Rate and Rhythm: Normal rate.  Pulmonary:     Effort: Pulmonary effort is normal. No respiratory distress.  Musculoskeletal:        General: Normal range of motion.     Cervical back: Normal range of motion.  Neurological:     Mental Status: She is alert and oriented to person, place, and time.  Psychiatric:        Mood and Affect: Mood is anxious.        Behavior: Behavior is withdrawn. Behavior is not agitated, aggressive, hyperactive or combative.        Thought Content: Thought content does not include homicidal or suicidal ideation.     Comments: Patient denies AVH, but per collateral, it is reported that the patient has been experiencing AVH over the past couple of months.  Affect is mood congruent.  Thought and speech are  blocked.  Patient is mostly uncooperative and refuses to answer most questions.  Judgment impaired and insight lacking/poor.    Review of Systems  Constitutional: Negative for chills, diaphoresis, fever and malaise/fatigue.  HENT: Negative for congestion.   Respiratory: Negative for cough and shortness of breath.   Cardiovascular: Negative for chest pain and palpitations.  Gastrointestinal: Negative for abdominal pain, constipation, diarrhea, nausea and vomiting.  Musculoskeletal: Negative for joint pain and myalgias.  Neurological: Negative for dizziness and headaches.  Psychiatric/Behavioral: Negative for depression and suicidal  ideas. The patient is nervous/anxious and has insomnia.        Patient denies AVH, but per collateral, it is reported that the patient has been experiencing AVH over the past couple of months.  Patient refuses to answer questions regarding substance use.  All other systems reviewed and are negative.   Vitals: Blood pressure (!) 154/96, pulse 99, temperature (!) 97.5 F (36.4 C), temperature source Temporal, resp. rate 18, SpO2 99 %. There is no height or weight on file to calculate BMI.  Past Psychiatric History: Schizophrenia, MDD  Is the patient at risk to self? Yes  Has the patient been a risk to self in the past 6 months? Yes .    Has the patient been a risk to self within the distant past? No   Is the patient a risk to others? No   Has the patient been a risk to others in the past 6 months? No   Has the patient been a risk to others within the distant past? No   Past Medical History:  Past Medical History:  Diagnosis Date  . Anxiety   . Depression    History reviewed. No pertinent surgical history.  Family History: History reviewed. No pertinent family history.  Social History:  Social History   Socioeconomic History  . Marital status: Single    Spouse name: Not on file  . Number of children: Not on file  . Years of education: Not on file  . Highest education level: Not on file  Occupational History  . Occupation: Consulting civil engineer  Tobacco Use  . Smoking status: Never Smoker  . Smokeless tobacco: Never Used  Vaping Use  . Vaping Use: Never used  Substance and Sexual Activity  . Alcohol use: Yes    Comment: occasional  . Drug use: Yes    Types: Marijuana  . Sexual activity: Not Currently  Other Topics Concern  . Not on file  Social History Narrative   Pt currently lives in student housing at [employer].  She is currently contemplating continuing her education.  She also works at a [employer].  Pt currently followed by [facility].   Social Determinants of Health    Financial Resource Strain: Not on file  Food Insecurity: Not on file  Transportation Needs: Not on file  Physical Activity: Not on file  Stress: Not on file  Social Connections: Not on file  Intimate Partner Violence: Not on file    SDOH:  SDOH Screenings   Alcohol Screen: Low Risk   . Last Alcohol Screening Score (AUDIT): 0  Depression (PHQ2-9): Not on file  Financial Resource Strain: Not on file  Food Insecurity: Not on file  Housing: Not on file  Physical Activity: Not on file  Social Connections: Not on file  Stress: Not on file  Tobacco Use: Low Risk   . Smoking Tobacco Use: Never Smoker  . Smokeless Tobacco Use: Never Used  Transportation Needs: Not on file  Last Labs:  Admission on 03/08/2020, Discharged on 03/18/2020  Component Date Value Ref Range Status  . Color, Urine 03/08/2020 AMBER* YELLOW Final   BIOCHEMICALS MAY BE AFFECTED BY COLOR  . APPearance 03/08/2020 CLOUDY* CLEAR Final  . Specific Gravity, Urine 03/08/2020 1.032* 1.005 - 1.030 Final  . pH 03/08/2020 5.0  5.0 - 8.0 Final  . Glucose, UA 03/08/2020 NEGATIVE  NEGATIVE mg/dL Final  . Hgb urine dipstick 03/08/2020 SMALL* NEGATIVE Final  . Bilirubin Urine 03/08/2020 NEGATIVE  NEGATIVE Final  . Ketones, ur 03/08/2020 20* NEGATIVE mg/dL Final  . Protein, ur 16/11/9602 100* NEGATIVE mg/dL Final  . Nitrite 54/10/8117 NEGATIVE  NEGATIVE Final  . Glori Luis 03/08/2020 NEGATIVE  NEGATIVE Final  . RBC / HPF 03/08/2020 11-20  0 - 5 RBC/hpf Final  . WBC, UA 03/08/2020 6-10  0 - 5 WBC/hpf Final  . Bacteria, UA 03/08/2020 Pugh* NONE SEEN Final  . Squamous Epithelial / LPF 03/08/2020 6-10  0 - 5 Final  . Mucus 03/08/2020 PRESENT   Final   Performed at [facility], [address] [place], [place] [address]  . Preg Test, Ur 03/08/2020 NEGATIVE  NEGATIVE Final   Comment: THE SENSITIVITY OF THIS METHODOLOGY IS >20 mIU/mL. Performed at [facility], [address] [place], [place] [address]   . Opiates 03/08/2020 NONE DETECTED  NONE DETECTED Final  . Cocaine 03/08/2020 NONE DETECTED  NONE DETECTED Final  . Benzodiazepines 03/08/2020 NONE DETECTED  NONE DETECTED Final  . Amphetamines 03/08/2020 NONE DETECTED  NONE DETECTED Final  . Tetrahydrocannabinol 03/08/2020 NONE DETECTED  NONE DETECTED Final  . Barbiturates 03/08/2020 NONE DETECTED  NONE DETECTED Final   Comment: (NOTE) DRUG SCREEN FOR MEDICAL PURPOSES ONLY.  IF CONFIRMATION IS NEEDED FOR ANY PURPOSE, NOTIFY LAB WITHIN 5 DAYS.  LOWEST DETECTABLE LIMITS FOR URINE DRUG SCREEN Drug Class                     Cutoff (ng/mL) Amphetamine and metabolites    1000 Barbiturate and metabolites    200 Benzodiazepine                 200 Tricyclics and metabolites     300 Opiates and metabolites        300 Cocaine and metabolites        300 THC                            50 Performed at [facility], [address] [place], [place] [address]   . Color, Urine 03/09/2020 YELLOW  YELLOW Final  . APPearance 03/09/2020 HAZY* CLEAR Final  . Specific Gravity, Urine 03/09/2020 1.017  1.005 - 1.030 Final  . pH 03/09/2020 6.0  5.0 - 8.0 Final  . Glucose, UA 03/09/2020 NEGATIVE  NEGATIVE mg/dL Final  . Hgb urine dipstick 03/09/2020 NEGATIVE  NEGATIVE Final  . Bilirubin Urine 03/09/2020 NEGATIVE  NEGATIVE Final  . Ketones, ur 03/09/2020 NEGATIVE  NEGATIVE mg/dL Final  . Protein, ur 78/46/9629 NEGATIVE  NEGATIVE mg/dL Final  . Nitrite 52/84/1324 NEGATIVE  NEGATIVE Final  . Glori Luis 03/09/2020 NEGATIVE  NEGATIVE Final  . RBC / HPF 03/09/2020 0-5  0 - 5 RBC/hpf Final  . WBC, UA 03/09/2020 6-10  0 - 5 WBC/hpf Final  . Bacteria, UA 03/09/2020 RARE* NONE SEEN Final  . Squamous Epithelial / LPF 03/09/2020 11-20  0 - 5 Final  .  Mucus 03/09/2020 PRESENT   Final   Performed at [facility], [address], [place], [place] [address]  . Specimen Description 03/09/2020     Final                   Value:URINE, CLEAN CATCH Performed at [facility], [address] [place], [place] [address]   . Special Requests 03/09/2020    Final                   Value:NONE Performed at [facility], [address] [place], [place] [address]   . Culture 03/09/2020 MULTIPLE SPECIES PRESENT, SUGGEST RECOLLECTION*  Final  . Report Status 03/09/2020 03/13/2020 FINAL   Final  . Total Protein 03/10/2020 8.0  6.5 - 8.1 g/dL Final  . Albumin 61/44/3154 4.4  3.5 - 5.0 g/dL Final  . AST 00/86/7619 17  15 - 41 U/L Final  . ALT 03/10/2020 27  0 - 44 U/L Final  . Alkaline Phosphatase 03/10/2020 68  38 - 126 U/L Final  . Total Bilirubin 03/10/2020 0.9  0.3 - 1.2 mg/dL Final  . Bilirubin, Direct 03/10/2020 <0.1  0.0 - 0.2 mg/dL Final  . Indirect Bilirubin 03/10/2020 NOT CALCULATED  0.3 - 0.9 mg/dL Final   Performed at [facility], [address], [place], [place] [address]  . WBC 03/10/2020 6.3  4.0 - 10.5 K/uL Final  . RBC 03/10/2020 4.07  3.87 - 5.11 MIL/uL Final  . Hemoglobin 03/10/2020 12.7  12.0 - 15.0 g/dL Final  . HCT 67/01/4579 36.9  36.0 - 46.0 % Final  . MCV 03/10/2020 90.7  80.0 - 100.0 fL Final  . MCH 03/10/2020 31.2  26.0 - 34.0 pg Final  . MCHC 03/10/2020 34.4  30.0 - 36.0 g/dL Final  . RDW 99/83/3825 12.7  11.5 - 15.5 % Final  . Platelets 03/10/2020 411* 150 - 400 K/uL Final  . nRBC 03/10/2020 0.0  0.0 - 0.2 % Final  . Neutrophils Relative % 03/10/2020 57  % Final  . Neutro Abs 03/10/2020 3.6  1.7 - 7.7 K/uL Final  . Lymphocytes Relative 03/10/2020 34  % Final  . Lymphs Abs 03/10/2020 2.1  0.7 - 4.0 K/uL Final  . Monocytes Relative 03/10/2020 9  % Final  . Monocytes Absolute 03/10/2020 0.6  0.1 - 1.0 K/uL Final  . Eosinophils Relative 03/10/2020 0  % Final  . Eosinophils Absolute 03/10/2020 0.0  0.0 - 0.5 K/uL Final  . Basophils Relative 03/10/2020 0  % Final  . Basophils Absolute 03/10/2020 0.0  0.0 -  0.1 K/uL Final  . Immature Granulocytes 03/10/2020 0  % Final  . Abs Immature Granulocytes 03/10/2020 0.02  0.00 - 0.07 K/uL Final   Performed at [facility], [address], [place], [place] [address]  . Specimen Description 03/14/2020    Final                   Value:URINE, CLEAN CATCH Performed at [facility], [address] [place], [place] [address]   . Special Requests 03/14/2020    Final                   Value:NONE Performed at [facility], [address] [place], [place] [address]   . Culture 03/14/2020 *  Final  Value:<10,000 COLONIES/mL INSIGNIFICANT GROWTH Performed at [facility], [address] [place], [place] [address]   . Report Status 03/14/2020 03/16/2020 FINAL   Final  . WBC 03/16/2020 5.9  4.0 - 10.5 K/uL Final  . RBC 03/16/2020 4.02  3.87 - 5.11 MIL/uL Final  . Hemoglobin 03/16/2020 12.6  12.0 - 15.0 g/dL Final  . HCT 47/82/9562 37.4  36.0 - 46.0 % Final  . MCV 03/16/2020 93.0  80.0 - 100.0 fL Final  . MCH 03/16/2020 31.3  26.0 - 34.0 pg Final  . MCHC 03/16/2020 33.7  30.0 - 36.0 g/dL Final  . RDW 13/09/6576 12.9  11.5 - 15.5 % Final  . Platelets 03/16/2020 358  150 - 400 K/uL Final  . nRBC 03/16/2020 0.0  0.0 - 0.2 % Final  . Neutrophils Relative % 03/16/2020 64  % Final  . Neutro Abs 03/16/2020 3.7  1.7 - 7.7 K/uL Final  . Lymphocytes Relative 03/16/2020 24  % Final  . Lymphs Abs 03/16/2020 1.4  0.7 - 4.0 K/uL Final  . Monocytes Relative 03/16/2020 11  % Final  . Monocytes Absolute 03/16/2020 0.6  0.1 - 1.0 K/uL Final  . Eosinophils Relative 03/16/2020 1  % Final  . Eosinophils Absolute 03/16/2020 0.0  0.0 - 0.5 K/uL Final  . Basophils Relative 03/16/2020 0  % Final  . Basophils Absolute 03/16/2020 0.0  0.0 - 0.1 K/uL Final  . Immature Granulocytes 03/16/2020 0  % Final  . Abs Immature Granulocytes 03/16/2020 0.02  0.00 - 0.07 K/uL Final   Performed at [facility], [address] [place], [place] [address]  . Sodium 03/16/2020 139  135 - 145 mmol/L Final  . Potassium 03/16/2020 4.1  3.5 - 5.1 mmol/L Final  . Chloride 03/16/2020 103  98 - 111 mmol/L Final  . CO2 03/16/2020 24  22 - 32 mmol/L Final  . Glucose, Bld 03/16/2020 112* 70 - 99 mg/dL Final   Glucose reference range applies only to samples taken after fasting for at least 8 hours.  . BUN 03/16/2020 12  6 - 20 mg/dL Final  . Creatinine, Ser 03/16/2020 0.70  0.44 - 1.00 mg/dL Final  . Calcium 95/28/4132 9.3  8.9 - 10.3 mg/dL Final  . Total Protein 03/16/2020 7.3  6.5 - 8.1 g/dL Final  . Albumin 44/02/270 4.2  3.5 - 5.0 g/dL Final  . AST 53/66/4403 15  15 - 41 U/L Final  . ALT 03/16/2020 20  0 - 44 U/L Final  . Alkaline Phosphatase 03/16/2020 66  38 - 126 U/L Final  . Total Bilirubin 03/16/2020 0.5  0.3 - 1.2 mg/dL Final  . GFR, Estimated 03/16/2020 >60  >60 mL/min Final   Comment: (NOTE) Calculated using the CKD-EPI Creatinine Equation (2021)   . Anion gap 03/16/2020 12  5 - 15 Final   Performed at [facility], [address] [place], [place] [address]  . Glucose-Capillary 03/16/2020 112* 70 - 99 mg/dL Final   Glucose reference range applies only to samples taken after fasting for at least 8 hours.  Admission on 03/07/2020, Discharged on 03/08/2020  Component Date Value Ref Range Status  . SARS Coronavirus 2 by RT PCR 03/07/2020 NEGATIVE  NEGATIVE Final   Comment: (NOTE) SARS-CoV-2 target nucleic acids are NOT DETECTED.  The SARS-CoV-2 RNA is generally detectable in upper respiratory specimens during the acute phase of infection. The lowest concentration of SARS-CoV-2 viral copies this assay can detect is 138  copies/mL. A negative result does not preclude SARS-Cov-2 infection and should not be used as the sole basis for treatment or other patient management decisions. A negative result may occur with  improper specimen collection/handling,  submission of specimen other than nasopharyngeal swab, presence of viral mutation(s) within the areas targeted by this assay, and inadequate number of viral copies(<138 copies/mL). A negative result must be combined with clinical observations, patient history, and epidemiological information. The expected result is Negative.  Fact Sheet for Patients:  BloggerCourse.com  Fact Sheet for Healthcare Providers:  SeriousBroker.it  This test is no                          t yet approved or cleared by the [place] FDA and  has been authorized for detection and/or diagnosis of SARS-CoV-2 by FDA under an Emergency Use Authorization (EUA). This EUA will remain  in effect (meaning this test can be used) for the duration of the COVID-19 declaration under Section 564(b)(1) of the Act, 21 U.S.C.section 360bbb-3(b)(1), unless the authorization is terminated  or revoked sooner.      . Influenza A by PCR 03/07/2020 NEGATIVE  NEGATIVE Final  . Influenza B by PCR 03/07/2020 NEGATIVE  NEGATIVE Final   Comment: (NOTE) The Xpert Xpress SARS-CoV-2/FLU/RSV plus assay is intended as an aid in the diagnosis of influenza from Nasopharyngeal swab specimens and should not be used as a sole basis for treatment. Nasal washings and aspirates are unacceptable for Xpert Xpress SARS-CoV-2/FLU/RSV testing.  Fact Sheet for Patients: BloggerCourse.com  Fact Sheet for Healthcare Providers: SeriousBroker.it  This test is not yet approved or cleared by the [place] FDA and has been authorized for detection and/or diagnosis of SARS-CoV-2 by FDA under an Emergency Use Authorization (EUA). This EUA will remain in effect (meaning this test can be used) for the duration of the COVID-19 declaration under Section 564(b)(1) of the Act, 21 U.S.C. section 360bbb-3(b)(1), unless the authorization is terminated  or revoked.  Performed at [facility], [address], [place], [place] [address]   . SARS Coronavirus 2 Ag 03/07/2020 Negative  Negative Final  . WBC 03/07/2020 8.3  4.0 - 10.5 K/uL Final  . RBC 03/07/2020 4.21  3.87 - 5.11 MIL/uL Final  . Hemoglobin 03/07/2020 12.9  12.0 - 15.0 g/dL Final  . HCT 60/45/4098 37.8  36.0 - 46.0 % Final  . MCV 03/07/2020 89.8  80.0 - 100.0 fL Final  . MCH 03/07/2020 30.6  26.0 - 34.0 pg Final  . MCHC 03/07/2020 34.1  30.0 - 36.0 g/dL Final  . RDW 11/91/4782 12.4  11.5 - 15.5 % Final  . Platelets 03/07/2020 422* 150 - 400 K/uL Final  . nRBC 03/07/2020 0.0  0.0 - 0.2 % Final  . Neutrophils Relative % 03/07/2020 68  % Final  . Neutro Abs 03/07/2020 5.6  1.7 - 7.7 K/uL Final  . Lymphocytes Relative 03/07/2020 24  % Final  . Lymphs Abs 03/07/2020 2.0  0.7 - 4.0 K/uL Final  . Monocytes Relative 03/07/2020 7  % Final  . Monocytes Absolute 03/07/2020 0.6  0.1 - 1.0 K/uL Final  . Eosinophils Relative 03/07/2020 0  % Final  . Eosinophils Absolute 03/07/2020 0.0  0.0 - 0.5 K/uL Final  . Basophils Relative 03/07/2020 0  % Final  . Basophils Absolute 03/07/2020 0.0  0.0 - 0.1 K/uL Final  . Immature Granulocytes 03/07/2020 1  % Final  . Abs Immature  Granulocytes 03/07/2020 0.04  0.00 - 0.07 K/uL Final   Performed at [facility], [address] [place], [place] [address]  . Sodium 03/07/2020 138  135 - 145 mmol/L Final  . Potassium 03/07/2020 3.8  3.5 - 5.1 mmol/L Final  . Chloride 03/07/2020 101  98 - 111 mmol/L Final  . CO2 03/07/2020 24  22 - 32 mmol/L Final  . Glucose, Bld 03/07/2020 85  70 - 99 mg/dL Final   Glucose reference range applies only to samples taken after fasting for at least 8 hours.  . BUN 03/07/2020 10  6 - 20 mg/dL Final  . Creatinine, Ser 03/07/2020 0.74  0.44 - 1.00 mg/dL Final  . Calcium 60/45/4098 9.6  8.9 - 10.3 mg/dL Final  . Total Protein 03/07/2020 7.6  6.5 - 8.1 g/dL Final  . Albumin 11/91/4782 4.4  3.5 - 5.0 g/dL  Final  . AST 95/62/1308 24  15 - 41 U/L Final  . ALT 03/07/2020 29  0 - 44 U/L Final  . Alkaline Phosphatase 03/07/2020 72  38 - 126 U/L Final  . Total Bilirubin 03/07/2020 1.7* 0.3 - 1.2 mg/dL Final  . GFR, Estimated 03/07/2020 >60  >60 mL/min Final   Comment: (NOTE) Calculated using the CKD-EPI Creatinine Equation (2021)   . Anion gap 03/07/2020 13  5 - 15 Final   Performed at [facility], [address], [place], [place] [address]  . Hgb A1c MFr Bld 03/07/2020 6.0* 4.8 - 5.6 % Final   Comment: (NOTE) Pre diabetes:          5.7%-6.4%  Diabetes:              >6.4%  Glycemic control for   <7.0% adults with diabetes   . Mean Plasma Glucose 03/07/2020 125.5  mg/dL Final   Performed at [facility], [address], [place], [place] [address]  . Alcohol, Ethyl (B) 03/07/2020 <10  <10 mg/dL Final   Comment: (NOTE) Lowest detectable limit for serum alcohol is 10 mg/dL.  For medical purposes only. Performed at [facility], [address] [place], [place] [address]   . TSH 03/07/2020 0.914  0.350 - 4.500 uIU/mL Final   Comment: Performed by a 3rd Generation assay with a functional sensitivity of <=0.01 uIU/mL. Performed at [facility], [address] [place], [place] [address]   . Cholesterol 03/07/2020 187  0 - 200 mg/dL Final  . Triglycerides 03/07/2020 49  <150 mg/dL Final  . HDL 12/02/2534 58  >40 mg/dL Final  . Total CHOL/HDL Ratio 03/07/2020 3.2  RATIO Final  . VLDL 03/07/2020 10  0 - 40 mg/dL Final  . LDL Cholesterol 03/07/2020 119* 0 - 99 mg/dL Final   Comment:        Total Cholesterol/HDL:CHD Risk Coronary Heart Disease Risk Table                     Men   Women  1/2 Average Risk   3.4   3.3  Average Risk       5.0   4.4  2 X Average Risk   9.6   7.1  3 X Average Risk  23.4   11.0        Use the calculated Patient Ratio above and the CHD Risk Table to determine the patient's CHD Risk.        ATP III CLASSIFICATION (LDL):  <100  mg/dL   Optimal  956-213  mg/dL   Near or Above                    Optimal  130-159  mg/dL   Borderline  086-578  mg/dL   High  >469     mg/dL   Very High Performed at [facility], [address], [place], [place] [address]   . Magnesium 03/07/2020 2.1  1.7 - 2.4 mg/dL Final   Performed at [facility], [address], [place], [place] [address]  . SARS Coronavirus 2 Ag 03/07/2020 NEGATIVE  NEGATIVE Final   Comment: (NOTE) SARS-CoV-2 antigen NOT DETECTED.   Negative results are presumptive.  Negative results do not preclude SARS-CoV-2 infection and should not be used as the sole basis for treatment or other patient management decisions, including infection  control decisions, particularly in the presence of clinical signs and  symptoms consistent with COVID-19, or in those who have been in contact with the virus.  Negative results must be combined with clinical observations, patient history, and epidemiological information. The expected result is Negative.  Fact Sheet for Patients: https://www.jennings-kim.com/  Fact Sheet for Healthcare Providers: https://alexander-rogers.biz/  This test is not yet approved or cleared by the [place] FDA and  has been authorized for detection and/or diagnosis of SARS-CoV-2 by FDA under an Emergency Use Authorization (EUA).  This EUA will remain in effect (meaning this test can be used) for the duration of  the COV                          ID-19 declaration under Section 564(b)(1) of the Act, 21 U.S.C. section 360bbb-3(b)(1), unless the authorization is terminated or revoked sooner.      Allergies: Patient has no known allergies.  PTA Medications: (Not in a hospital admission)   Medical Decision Making  Patient is a 25 year old female with a history of schizophrenia who presents to the behavioral health urgent care voluntarily via EMS 3 days after being discharged from [facility].  Patient unable to  provide much information regarding her current condition as she refuses to answer most questions.  However, based on patient's behavior and obtain collateral, believe that the patient's condition is decompensating at this time and believe that the patient may be a potential threat to herself at this time.    Recommendations  Based on my evaluation the patient does not appear to have an emergency medical condition. Will place the patient in behavioral health urgent care continuous observation/assessment for further stabilization and treatment.  Patient will be reevaluated by the treatment team on March 21, 2020 and disposition to be determined at that time. Labs ordered:  -CBC with differential, CMP: Results pending  -Urine pregnancy, UDS: Patient refused to provide urine specimen.  Recommend that nursing attempts to obtain a urine specimen for these labs on March 21, 2020. TSH, lipid panel, and hemoglobin A1c from March 07, 2020 reviewed due to patient's history of taking antipsychotic medications: Hemoglobin A1c slightly elevated at 6%, TSH within normal limits, lipid panel showed slight elevation of LDL cholesterol at 119 mg/dL, but otherwise unremarkable 03/16/20 EKG reviewed due to patient's history of taking antipsychotic medications.  EKG shows QTC of 458 ms with no acute/concerning findings.  QTC is acceptable for continue antipsychotic medications. Will restart the following home medications:  -Lexapro 20 mg p.o. daily for MDD  -Risperdal 1 mg p.o. daily for schizophrenia  -  Risperdal 4 mg p.o. daily at bedtime for schizophrenia   Jaclyn Shaggy, PA-C 03/21/20  2:15 AM

## 2020-03-21 NOTE — BH Assessment (Addendum)
Comprehensive Clinical Assessment (CCA) Note  03/21/2020 Andrea Pugh [id number]  -Patient called EMS because she was having a panic attack.  She said she woke up from a nightmare and was frightened and anxious.    Patient denies any SI, Hi or A/V hallucinations.  Patient says that she does not use ETOH or other substances.  Patient will take a long time to answer questions.  She will look at a fixed area on the wall as if seeing something there.  Pt does this during most of the assessment.  Patient will ask if she can lay down and complains of being tired.  Patient has poor eye contact but is oriented x4.  She appears to be responding to internal stimuli.  She does not show delusional thoughts but she is distracted.  Patient says she has not slept in 2 days which corresponds to when she was at [facility].  Pt was at [facility] from February 1-11, 2022.  Patient gave permission to contact her aunt, [name]. Manson Passey.  Her number is [phone].  She is in [place].  She said that patient does live by herself.  She said that patient has not been in contact with her for the last few days.  Godmother said that patient has made statements in the past about wanting to kill herself but has never attempted it.  -Patient was seen also by PA Melbourne Abts.  He did the MSE.  Patient needs to have continuous assessment at [facility] for the rest of the morning.  Patient appears to have unclear thinking which may impact her safety   Chief Complaint:  Chief Complaint  Patient presents with  . Anxiety   Visit Diagnosis: F20.9 Schizophrenia   CCA Screening, Triage and Referral (STR)  Patient Reported Information How did you hear about [place]? Other (Comment) (EMS brought her to [facility].  She called them and someone else did too.)  Referral name: Pt brought in by [employer] with IVC  Referral phone number: No data recorded  Whom do you see for routine medical problems? I don't have a doctor  Practice/Facility Name: No data  recorded Practice/Facility Phone Number: No data recorded Name of Contact: No data recorded Contact Number: No data recorded Contact Fax Number: No data recorded Prescriber Name: No data recorded Prescriber Address (if known): No data recorded  What Is the Reason for Your Visit/Call Today? Pt called EMS tonight.  She said she called becaues she fellt like she was having a panic attack.  How Long Has This Been Causing You Problems? 1 wk - 1 month  What Do You Feel Would Help You the Most Today? Assessment Only   Have You Recently Been in Any Inpatient Treatment (Hospital/Detox/Crisis Center/28-Day Program)? Yes  Name/Location of Program/Hospital:Was at [facility] on 03-08-20  How Long Were You There? One day  When Were You Discharged? 03/08/2020   Have You Ever Received Services From [employer] [facility] [facility] Before? Yes  Who Do You See at [facility]? Pt has been seen multiple times presenting with similar symptoms   Have You Recently Had Any Thoughts About Hurting Yourself? No  Are You Planning to Commit Suicide/Harm Yourself At This time? No   Have you Recently Had Thoughts About Hurting Someone Karolee Ohs? No  Explanation: No data recorded  Have You Used Any Alcohol or Drugs in the Past 24 Hours? No  How Long Ago Did You Use Drugs or Alcohol? No data recorded What Did You Use and How Much? No data recorded  Do You Currently Have a Therapist/Psychiatrist? No  Name of Therapist/Psychiatrist: No data recorded  Have You Been Recently Discharged From Any Office Practice or Programs? No  Explanation of Discharge From Practice/Program: No data recorded    CCA Screening Triage Referral Assessment Type of Contact: Face-to-Face  Is this Initial or Reassessment? No data recorded Date Telepsych consult ordered in [facility]:  No data recorded Time Telepsych consult ordered in [facility]:  No data recorded  Patient Reported Information Reviewed? Yes  Patient Left Without Being Seen? No data recorded Reason  for Not Completing Assessment: No data recorded  Collateral Involvement: No data recorded  Does Patient Have a Court Appointed Legal Guardian? No data recorded Name and Contact of Legal Guardian: Self  If Minor and Not Living with Parent(s), Who has Custody? NA  Is CPS involved or ever been involved? Never  Is APS involved or ever been involved? Never   Patient Determined To Be At Risk for Harm To Self or Others Based on Review of Patient Reported Information or Presenting Complaint? No  Method: No data recorded Availability of Means: No data recorded Intent: No data recorded Notification Required: No data recorded Additional Information for Danger to Others Potential: No data recorded Additional Comments for Danger to Others Potential: No data recorded Are There Guns or Other Weapons in Your Home? No data recorded Types of Guns/Weapons: No data recorded Are These Weapons Safely Secured?                            No data recorded Who Could Verify You Are Able To Have These Secured: No data recorded Do You Have any Outstanding Charges, Pending Court Dates, Parole/Probation? No data recorded Contacted To Inform of Risk of Harm To Self or Others: -- (None)   Location of Assessment: [facility]   Does Patient Present under Involuntary Commitment? Yes  IVC Papers Initial File Date: 03/07/2020   [place] of Residence: [place]   Patient Currently Receiving the Following Services: Not Receiving Services   Determination of Need: Emergent (2 hours)   Options For Referral: Therapeutic Triage Services (Pt to stay at [facility] overnight for continuous observation.)     CCA Biopsychosocial Intake/Chief Complaint:  Pt called EMS to bring her to [facility].  Patient says she had a panic attack and that it was triggered by a nightmare.  Patient is not able to provide much in the way of details.  She said she has no SI, HI or A/ V halluciantions.  Pt seems to be looking at a  fixed point in space at times..  Patient says she has no current provider but had one in January.  Current Symptoms/Problems: Pt is very sleepy and is not answering questions clearly.   Patient Reported Schizophrenia/Schizoaffective Diagnosis in Past: Yes   Strengths: No data recorded Preferences: No data recorded Abilities: No data recorded  Type of Services Patient Feels are Needed: No data recorded  Initial Clinical Notes/Concerns: No data recorded  Mental Health Symptoms Depression:  -- (UTA)   Duration of Depressive symptoms: No data recorded  Mania:  None   Anxiety:   Difficulty concentrating   Psychosis:  Hallucinations   Duration of Psychotic symptoms: Greater than six months   Trauma:  None   Obsessions:  None   Compulsions:  None   Inattention:  None   Hyperactivity/Impulsivity:  N/A   Oppositional/Defiant Behaviors:  None   Emotional Irregularity:  None   Other Mood/Personality Symptoms:  No data recorded   Mental Status Exam Appearance and self-care  Stature:  Average   Weight:  Obese   Clothing:  Disheveled   Grooming:  Neglected   Cosmetic use:  None   Posture/gait:  Slumped   Motor activity:  Slowed (Fatigue)   Sensorium  Attention:  Confused   Concentration:  Anxiety interferes   Orientation:  X5   Recall/memory:  Defective in Immediate   Affect and Mood  Affect:  Blunted; Depressed   Mood:  Depressed   Relating  Eye contact:  Avoided   Facial expression:  Constricted   Attitude toward examiner:  Uninterested   Thought and Language  Speech flow: Blocked   Thought content:  Suspicious   Preoccupation:  -- (NA)   Hallucinations:  Visual (Pt appears to be watching something not in the room.  Staring at a fixed point in space.)   Organization:  No data recorded  [employer] of Knowledge:  Poor   Intelligence:  Average   Abstraction:  Abstract   Judgement:  Impaired   Reality Testing:   Distorted   Insight:  Poor   Decision Making:  Confused   Social Functioning  Social Maturity:  Responsible   Social Judgement:  Normal   Stress  Stressors:  -- (UTA)   Coping Ability:  Overwhelmed   Skill Deficits:  Decision making; Interpersonal   Supports:  Family     Religion:    Leisure/Recreation:    Exercise/Diet: Exercise/Diet Do You Have Any Trouble Sleeping?: Yes Explanation of Sleeping Difficulties: Has not slept in 2 days.   CCA Employment/Education Employment/Work Situation:    Education:     CCA Family/Childhood History Family and Relationship History:    Childhood History:     Child/Adolescent Assessment:     CCA Substance Use Alcohol/Drug Use: Alcohol / Drug Use Pain Medications: None Prescriptions: See d/c list from stay at [facility] on 02/01. Over the Counter: None History of alcohol / drug use?: No history of alcohol / drug abuse                         ASAM's:  Six Dimensions of Multidimensional Assessment  Dimension 1:  Acute Intoxication and/or Withdrawal Potential:      Dimension 2:  Biomedical Conditions and Complications:      Dimension 3:  Emotional, Behavioral, or Cognitive Conditions and Complications:     Dimension 4:  Readiness to Change:     Dimension 5:  Relapse, Continued use, or Continued Problem Potential:     Dimension 6:  Recovery/Living Environment:     ASAM Severity Score:    ASAM Recommended Level of Treatment:     Substance use Disorder (SUD)    Recommendations for Services/Supports/Treatments:    DSM5 Diagnoses: Patient Active Problem List   Diagnosis Date Noted  . Schizophrenia (HCC) 03/09/2020  . Schizophrenia spectrum disorder with psychotic disorder type not yet determined (HCC) 04/13/2019  . Depression, major, recurrent, severe with psychosis (HCC)   . Psychosis (HCC) 04/03/2019  . MDD (major depressive disorder), recurrent episode, severe (HCC) 04/01/2019  . Major depressive  disorder, recurrent severe without psychotic features (HCC) 07/31/2017    Patient Centered Plan: Patient is on the following Treatment Plan(s):  Anxiety   Referrals to Alternative Service(s): Referred to Alternative Service(s):   Place:   Date:   Time:    Referred to Alternative Service(s):  Place:   Date:   Time:    Referred to Alternative Service(s):   Place:   Date:   Time:    Referred to Alternative Service(s):   Place:   Date:   Time:     Waldron Session

## 2020-03-21 NOTE — ED Notes (Signed)
PT BELONGINGS ARE STORED IN LOCKER #03

## 2020-03-22 MED ORDER — RISPERIDONE 1 MG PO TABS: 1.0000 mg | ORAL_TABLET | Freq: Every day | ORAL | 0 refills | Status: AC

## 2020-03-22 MED ORDER — ESCITALOPRAM OXALATE 20 MG PO TABS: 20.0000 mg | ORAL_TABLET | Freq: Every day | ORAL | 0 refills | Status: AC

## 2020-03-22 MED ORDER — RISPERIDONE 4 MG PO TABS: 4.0000 mg | ORAL_TABLET | Freq: Every day | ORAL | 0 refills | Status: AC

## 2020-03-22 NOTE — ED Notes (Signed)
Patient A&O x 4, ambulatory. Patient discharged in no acute distress. Patient denied SI/HI, A/VH upon discharge. Patient verbalized understanding of all discharge instructions explained by staff, to include follow up appointments, RX's and safety plan. Patient escorted to lobby via staff for transport to destination. Safety maintained.  

## 2020-03-22 NOTE — ED Provider Notes (Signed)
FBC/OBS ASAP Discharge Summary  Date and Time: 03/22/2020 10:04 AM  Name: Andrea Pugh  MRN:  [id number]   Discharge Diagnoses:  Final diagnoses:  Schizophrenia, unspecified type (HCC)    Subjective:  Chart reviewed. Pt has been medication compliant, no PRNs needed for agitation. She is sleeping on approach and is calm and cooperative. She states that she is feeling better since she was able to get some sleep. She denies SI/HI/AVH. She initially indicated that she would like to stay with her uncle for a couple days but did not know his phone number, but later stated that she would like to return to her apartment. Pt states that she has a psychiatrist (per discharge summary from recent inpatient stay was referred to the [facility]) and states that she would like medications sent to pharmacy of choice. Discussed open access hours and that samples will be provided. Pt verbalized understanding.  Stay Summary:  Andrea Pugh is a 25 year old female with a history of MDD and schizophrenia who presents to the behavioral health urgent care voluntarily via EMS on 03/21/20 for anxiety/panic attack, 3 days after being discharged from [facility].  Patient states that she called EMS to bring her to the behavioral health urgent care because she "had a panic attack".  Patient states that this panic attack occurred on the evening of March 21, 2020.  Patient states that she was having a nightmare and she woke up experiencing a panic attack and was "really really scared".  Patient denies any shortness of breath, chest pain, nausea, vomiting, or impending sense of doom associated with this panic attack.  Patient denies SI or past suicide attempts.  She denies any self harming behaviors, such as cutting or burning herself.  She denies HI or AVH.  Per chart review, patient was admitted to [facility] on March 08, 2020 for schizophrenia/worsening bizarre behaviors and was discharged on March 18, 2020 with instructions to take  the following medications: Lexapro 20 mg p.o. daily for MDD, Risperdal 1 mg p.o. daily for schizophrenia, and Risperdal 4 mg p.o. at bedtime for schizophrenia.  Patient was also given follow-up information for [facility] and [facility] upon [facility] discharge.  Patient states that she takes her medications as prescribed, but states that she does not sleep much at all because " my medications keep me up".  Patient states that she has not seen a psychiatrist or therapist since January 2022.  When patient is asked who her psychiatrist and therapist were, she is unable to provide an answer.  When patient is asked further questions about her mental health history, she does not answer.  Patient refuses to answer questions about anhedonia, feelings of guilt/hopelessness, energy changes, concentration changes, appetite changes, or weight changes.  Patient refuses to answer questions about home life, access to weapons, or substance use.    Patient was admitted to observation. Later in the morning, patient admitted to intermittent medication compliance with risperdal and lexapro. Patient appeared somewhat bizarre. Patient remainedcompliant with medications during stay and was not a a management problem. . Today, day of discharge, patient denied SI/HI/AVH. See above for additional details. 7 day samples obtained from pharmacy, sent 30 day Rxs to pharmacy of choice.  Total Time spent with patient: 30 minutes  Past Psychiatric History: schizophrenia Past Medical History:  Past Medical History:  Diagnosis Date  . Anxiety   . Depression    History reviewed. No pertinent surgical history. Family History: History reviewed.  No pertinent family history. Family Psychiatric History: see H&P Social History:  Social History   Substance and Sexual Activity  Alcohol Use Yes   Comment: occasional     Social History   Substance and Sexual Activity  Drug Use Yes   . Types: Marijuana    Social History   Socioeconomic History  . Marital status: Single    Spouse name: Not on file  . Number of children: Not on file  . Years of education: Not on file  . Highest education level: Not on file  Occupational History  . Occupation: Consulting civil engineer  Tobacco Use  . Smoking status: Never Smoker  . Smokeless tobacco: Never Used  Vaping Use  . Vaping Use: Never used  Substance and Sexual Activity  . Alcohol use: Yes    Comment: occasional  . Drug use: Yes    Types: Marijuana  . Sexual activity: Not Currently  Other Topics Concern  . Not on file  Social History Narrative   Pt currently lives in student housing at [employer].  She is currently contemplating continuing her education.  She also works at a [employer].  Pt currently followed by [facility].   Social Determinants of Health   Financial Resource Strain: Not on file  Food Insecurity: Not on file  Transportation Needs: Not on file  Physical Activity: Not on file  Stress: Not on file  Social Connections: Not on file   SDOH:  SDOH Screenings   Alcohol Screen: Low Risk   . Last Alcohol Screening Score (AUDIT): 0  Depression (PHQ2-9): Not on file  Financial Resource Strain: Not on file  Food Insecurity: Not on file  Housing: Not on file  Physical Activity: Not on file  Social Connections: Not on file  Stress: Not on file  Tobacco Use: Low Risk   . Smoking Tobacco Use: Never Smoker  . Smokeless Tobacco Use: Never Used  Transportation Needs: Not on file    Has this patient used any form of tobacco in the last 30 days? (Cigarettes, Smokeless Tobacco, Cigars, and/or Pipes) Prescription not provided because: n/a  Current Medications:  Current Facility-Administered Medications  Medication Dose Route Frequency Provider Last Rate Last Admin  . acetaminophen (TYLENOL) tablet 650 mg  650 mg Oral Q6H PRN Jaclyn Shaggy, PA-C      . alum & mag hydroxide-simeth (MAALOX/MYLANTA) 200-200-20 MG/5ML  suspension 30 mL  30 mL Oral Q4H PRN Melbourne Abts W, PA-C      . escitalopram (LEXAPRO) tablet 20 mg  20 mg Oral Daily Melbourne Abts W, PA-C   20 mg at 03/22/20 0954  . hydrOXYzine (ATARAX/VISTARIL) tablet 25 mg  25 mg Oral TID PRN Jaclyn Shaggy, PA-C   25 mg at 03/21/20 1957  . magnesium hydroxide (MILK OF MAGNESIA) suspension 30 mL  30 mL Oral Daily PRN Melbourne Abts W, PA-C      . risperiDONE (RISPERDAL) tablet 1 mg  1 mg Oral Daily Melbourne Abts W, PA-C   1 mg at 03/22/20 0954  . risperiDONE (RISPERDAL) tablet 4 mg  4 mg Oral QHS Jaclyn Shaggy, PA-C   4 mg at 03/21/20 1957  . traZODone (DESYREL) tablet 50 mg  50 mg Oral QHS PRN Jaclyn Shaggy, PA-C   50 mg at 03/21/20 4665   Current Outpatient Medications  Medication Sig Dispense Refill  . escitalopram (LEXAPRO) 20 MG tablet Take 1 tablet (20 mg total) by mouth daily. 30 tablet 0  . risperiDONE (RISPERDAL)  1 MG tablet Take 1 tablet (1 mg total) by mouth daily. 30 tablet 0  . risperidone (RISPERDAL) 4 MG tablet Take 1 tablet (4 mg total) by mouth at bedtime. 30 tablet 0    PTA Medications: (Not in a hospital admission)   Musculoskeletal  Strength & Muscle Tone: within normal limits Gait & Station: normal Patient leans: N/A  Psychiatric Specialty Exam  Presentation  General Appearance: Appropriate for Environment; Casual  Eye Contact:Fair  Speech:Slow; Clear and Coherent  Speech Volume:Decreased  Handedness:Right   Mood and Affect  Mood:Euthymic  Affect:Appropriate; Congruent; Constricted   Thought Process  Thought Processes:Linear; Coherent  Descriptions of Associations:Intact  Orientation:Full (Time, Place and Person)  Thought Content:WDL  Hallucinations:Hallucinations: None  Ideas of Reference:None  Suicidal Thoughts:Suicidal Thoughts: No  Homicidal Thoughts:Homicidal Thoughts: No   Sensorium  Memory:Immediate Good; Recent Good; Remote Fair  Judgment:Fair  Insight:Fair   Executive Functions   Concentration:Fair  Attention Span:Fair  Recall:Fair  Fund of Knowledge:Fair  Language:Good   Psychomotor Activity  Psychomotor Activity:Psychomotor Activity: Normal   Assets  Assets:Communication Skills; Housing; Resilience   Sleep  Sleep:Sleep: Fair   Physical Exam  Physical Exam Constitutional:      Appearance: Normal appearance. She is normal weight.  HENT:     Head: Normocephalic and atraumatic.  Pulmonary:     Effort: Pulmonary effort is normal.  Neurological:     Mental Status: She is alert.    Review of Systems  Constitutional: Negative for chills and fever.  Neurological: Negative for headaches.  Psychiatric/Behavioral: Negative for depression, hallucinations, substance abuse and suicidal ideas.   Blood pressure 133/90, pulse (!) 124, temperature 98 F (36.7 C), temperature source Tympanic, resp. rate 16, SpO2 99 %. There is no height or weight on file to calculate BMI.  Demographic Factors:  Low socioeconomic status, Living alone and Unemployed  Loss Factors: Financial problems/change in socioeconomic status  Historical Factors: Impulsivity  Risk Reduction Factors:   Positive social support and Positive therapeutic relationship  Continued Clinical Symptoms:  Depression:   Impulsivity Insomnia Schizophrenia:   Less than 27 years old Paranoid or undifferentiated type  Cognitive Features That Contribute To Risk:  None    Suicide Risk:  Minimal: No identifiable suicidal ideation.  Patients presenting with no risk factors but with morbid ruminations; may be classified as minimal risk based on the severity of the depressive symptoms  Plan Of Care/Follow-up recommendations:  Activity:  as tolerated Diet:  regular Other:     Disposition:   Please come [facility] (this facility) during walk in hours for appointment with psychiatrist for further medication management and for therapy.   Walk in hours are 8-11 AM Monday  through Thursday for medication management.It is first come, first -serve; it is best to arrive by 7:00 AM. On Friday from 1 pm to 4 pm for therapy intake only. Please arrive by 12:00 pm as it is  first come, first -serve.   When you arrive please go upstairs for your appointment. If you are unsure of where to go, inform the front desk that you are here for a walk in appointment and they will assist you with directions upstairs.  Address:  [address] in [place], [address] Ph: [phone]   Patient is instructed prior to discharge to: Take all medications as prescribed by his/her mental healthcare provider. Report any adverse effects and or reactions from the medicines to his/her outpatient provider promptly. Patient has been instructed & cautioned:  To not engage in alcohol and or illegal drug use while on prescription medicines. In the event of worsening symptoms, patient is instructed to call the crisis hotline, 911 and or go to the nearest ED for appropriate evaluation and treatment of symptoms. To follow-up with his/her primary care provider for your other medical issues, concerns and or health care needs.      Estella HuskKatherine S Amorita Vanrossum, MD 03/22/2020, 10:04 AM

## 2020-03-22 NOTE — Discharge Instructions (Signed)
Please come to [facility] (this facility) during walk in hours for appointment with psychiatrist for further medication management and for therapy.   Walk in hours are 8-11 AM Monday through Thursday for medication management.It is first come, first -serve; it is best to arrive by 7:00 AM. On Friday from 1 pm to 4 pm for therapy intake only. Please arrive by 12:00 pm as it is  first come, first -serve.   When you arrive please go upstairs for your appointment. If you are unsure of where to go, inform the front desk that you are here for a walk in appointment and they will assist you with directions upstairs.  Address:  [address], in [place], [address] Ph: [phone]   Patient is instructed prior to discharge to: Take all medications as prescribed by his/her mental healthcare provider. Report any adverse effects and or reactions from the medicines to his/her outpatient provider promptly. Patient has been instructed & cautioned: To not engage in alcohol and or illegal drug use while on prescription medicines. In the event of worsening symptoms, patient is instructed to call the crisis hotline, 911 and or go to the nearest ED for appropriate evaluation and treatment of symptoms. To follow-up with his/her primary care provider for your other medical issues, concerns and or health care needs.    

## 2020-03-22 NOTE — ED Notes (Signed)
Pt sleeping@this time. Breathing even and unlabored. Will continue to monitor for safety 

## 2020-03-22 NOTE — BH Assessment (Signed)
Pt [facility] care coordinator Darnelle Maffucci [phone]) called to get an update on patient disposition. Informed her SW or staff will call her with update.

## 2020-03-22 NOTE — Progress Notes (Incomplete)
Andrea Pugh received her discharge order, the AVS was reviewed with her and questions answered. The medication administration was explained to her and questions answered. She retrieved her personal belongings. [facility] arrived at
